# Patient Record
Sex: Female | Born: 1965
Health system: Southern US, Community
[De-identification: ages and names within clinical notes are randomized; demographics above are authoritative.]

## PROBLEM LIST (undated history)

## (undated) DIAGNOSIS — I10 Essential (primary) hypertension: Secondary | ICD-10-CM

## (undated) DIAGNOSIS — M1990 Unspecified osteoarthritis, unspecified site: Secondary | ICD-10-CM

## (undated) DIAGNOSIS — F419 Anxiety disorder, unspecified: Secondary | ICD-10-CM

## (undated) DIAGNOSIS — J449 Chronic obstructive pulmonary disease, unspecified: Secondary | ICD-10-CM

## (undated) DIAGNOSIS — C3490 Malignant neoplasm of unspecified part of unspecified bronchus or lung: Secondary | ICD-10-CM

## (undated) DIAGNOSIS — E039 Hypothyroidism, unspecified: Secondary | ICD-10-CM

## (undated) DIAGNOSIS — K219 Gastro-esophageal reflux disease without esophagitis: Secondary | ICD-10-CM

## (undated) DIAGNOSIS — I2510 Atherosclerotic heart disease of native coronary artery without angina pectoris: Secondary | ICD-10-CM

## (undated) HISTORY — DX: Malignant neoplasm of unspecified part of unspecified bronchus or lung: C34.90

## (undated) HISTORY — DX: Anxiety disorder, unspecified: F41.9

## (undated) HISTORY — DX: Unspecified osteoarthritis, unspecified site: M19.90

## (undated) HISTORY — PX: CHOLECYSTECTOMY: SHX55

## (undated) HISTORY — DX: Essential (primary) hypertension: I10

## (undated) HISTORY — PX: OOPHORECTOMY: SHX86

## (undated) HISTORY — DX: Gastro-esophageal reflux disease without esophagitis: K21.9

## (undated) HISTORY — PX: SALPINGOOPHORECTOMY: SHX82

## (undated) HISTORY — PX: APPENDECTOMY: SHX54

## (undated) HISTORY — DX: Chronic obstructive pulmonary disease, unspecified: J44.9

---
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
## 2004-10-11 HISTORY — PX: LOBECTOMY: SHX5089

## 2005-01-15 ENCOUNTER — Ambulatory Visit (HOSPITAL_COMMUNITY): Admission: RE | Admit: 2005-01-15 | Discharge: 2005-01-15 | Payer: Self-pay | Admitting: Internal Medicine

## 2005-01-22 ENCOUNTER — Ambulatory Visit (HOSPITAL_COMMUNITY): Admission: RE | Admit: 2005-01-22 | Discharge: 2005-01-22 | Payer: Self-pay | Admitting: Internal Medicine

## 2005-02-16 ENCOUNTER — Ambulatory Visit (HOSPITAL_COMMUNITY): Admission: RE | Admit: 2005-02-16 | Discharge: 2005-02-16 | Payer: Self-pay | Admitting: Pulmonary Disease

## 2005-03-23 ENCOUNTER — Inpatient Hospital Stay (HOSPITAL_COMMUNITY): Admission: RE | Admit: 2005-03-23 | Discharge: 2005-03-28 | Payer: Self-pay | Admitting: Thoracic Surgery

## 2005-03-23 ENCOUNTER — Encounter (INDEPENDENT_AMBULATORY_CARE_PROVIDER_SITE_OTHER): Payer: Self-pay | Admitting: *Deleted

## 2005-03-25 ENCOUNTER — Ambulatory Visit: Payer: Self-pay | Admitting: Internal Medicine

## 2005-03-29 ENCOUNTER — Ambulatory Visit: Payer: Self-pay | Admitting: Internal Medicine

## 2005-04-09 ENCOUNTER — Encounter: Admission: RE | Admit: 2005-04-09 | Discharge: 2005-04-09 | Payer: Self-pay | Admitting: Thoracic Surgery

## 2005-05-12 ENCOUNTER — Encounter: Admission: RE | Admit: 2005-05-12 | Discharge: 2005-05-12 | Payer: Self-pay | Admitting: Thoracic Surgery

## 2005-06-29 ENCOUNTER — Ambulatory Visit: Payer: Self-pay | Admitting: Internal Medicine

## 2005-07-13 ENCOUNTER — Encounter: Admission: RE | Admit: 2005-07-13 | Discharge: 2005-07-13 | Payer: Self-pay | Admitting: Thoracic Surgery

## 2005-07-16 ENCOUNTER — Encounter: Admission: RE | Admit: 2005-07-16 | Discharge: 2005-07-16 | Payer: Self-pay | Admitting: Oncology

## 2005-07-16 ENCOUNTER — Ambulatory Visit (HOSPITAL_COMMUNITY): Payer: Self-pay | Admitting: Oncology

## 2005-07-27 ENCOUNTER — Ambulatory Visit (HOSPITAL_COMMUNITY): Admission: RE | Admit: 2005-07-27 | Discharge: 2005-07-27 | Payer: Self-pay | Admitting: Family Medicine

## 2006-07-20 ENCOUNTER — Emergency Department (HOSPITAL_COMMUNITY): Admission: EM | Admit: 2006-07-20 | Discharge: 2006-07-20 | Payer: Self-pay | Admitting: Emergency Medicine

## 2006-10-07 ENCOUNTER — Ambulatory Visit (HOSPITAL_COMMUNITY): Admission: RE | Admit: 2006-10-07 | Discharge: 2006-10-07 | Payer: Self-pay | Admitting: Internal Medicine

## 2007-10-10 ENCOUNTER — Ambulatory Visit (HOSPITAL_COMMUNITY): Admission: RE | Admit: 2007-10-10 | Discharge: 2007-10-10 | Payer: Self-pay | Admitting: Internal Medicine

## 2008-05-11 HISTORY — PX: NM MYOCAR IMG MI: HXRAD627

## 2008-05-11 HISTORY — PX: TRANSTHORACIC ECHOCARDIOGRAM: SHX275

## 2008-09-10 ENCOUNTER — Inpatient Hospital Stay (HOSPITAL_COMMUNITY): Admission: EM | Admit: 2008-09-10 | Discharge: 2008-09-11 | Payer: Self-pay | Admitting: Emergency Medicine

## 2008-09-11 ENCOUNTER — Encounter (INDEPENDENT_AMBULATORY_CARE_PROVIDER_SITE_OTHER): Payer: Self-pay | Admitting: General Surgery

## 2010-10-31 ENCOUNTER — Encounter (HOSPITAL_COMMUNITY): Payer: Self-pay | Admitting: Oncology

## 2010-11-01 ENCOUNTER — Encounter: Payer: Self-pay | Admitting: Thoracic Surgery

## 2010-11-01 ENCOUNTER — Encounter: Payer: Self-pay | Admitting: Internal Medicine

## 2011-02-23 NOTE — H&P (Signed)
NAMEVICCI, Marissa [name]               ACCOUNT NO.:  [id number]   MEDICAL RECORD NO.:  [id number]          PATIENT TYPE:  EMS   LOCATION:  ED                            FACILITY:  [facility]   PHYSICIAN:  Dalia Heading, M.D.  DATE OF BIRTH:  05-25-1966   DATE OF ADMISSION:  09/10/2008  DATE OF DISCHARGE:  LH                              HISTORY & PHYSICAL   CHIEF COMPLAINT:  Cholecystitis, cholelithiasis.   HISTORY OF PRESENT ILLNESS:  The patient is a 45 year old white female  who presents with a 24-hour history of worsening right upper quadrant  abdominal pain, nausea, vomiting.  She did have a fatty meal yesterday  evening which caused her to have the biliary colic symptoms.  She  presented to the emergency room for further evaluation and treatment.  CT scan of the abdomen and pelvis revealed acute cholecystitis with  cholelithiasis.  The hepatobiliary tree was within normal limits.  She  does have a right adnexal cystic mass.  She has had pelvic surgery in  the past.  She denies any fever, chills, or jaundice.   PAST MEDICAL HISTORY:  1. Lung carcinoma.  2. Non-insulin-dependent diabetes mellitus.   PAST SURGICAL HISTORY:  1. Left lung lobectomy.  2. Appendectomy.  3. Hysterectomy.  4. Oophorectomy.   CURRENT MEDICATIONS:  Xanax, Tylox, metformin, Prilosec   ALLERGIES:  PENICILLIN.   REVIEW OF SYSTEMS:  The patient does smoke.  She denies any illicit drug  use or alcohol use.  She denies any recent chest pain, MI, CVA, or  bleeding disorders.   PHYSICAL EXAMINATION:  GENERAL:  The patient is an obese white female in  no acute distress.  She is afebrile.  VITAL SIGNS:  Stable.  HEENT:  Reveals no scleral icterus.  LUNGS:  Clear to auscultation with equal breath sounds bilaterally.  HEART:  Examination reveals regular rate and rhythm without history, S4,  or murmurs.  ABDOMEN:  Soft with tenderness down the right upper quadrant to  palpation.  No hepatosplenomegaly, masses,  hernias are identified.   LABORATORY DATA:  White blood cell count 10.7, hemoglobin 14.3,  hematocrit 42.2, platelet count 387.  Met-7 is remarkable for a glucose  of 329, BUN 4, creatinine 0.63.  SGOT and SGPT are within normal limits.  Total bilirubin is 0.3.  Urinalysis is negative.   IMPRESSION:  1. Cholecystitis, cholelithiasis.  2. History of lung cancer.  3. Hyperglycemia, uncontrolled diabetes mellitus.   PLAN:  The patient will be admitted to the hospital for control of her  sugars.  She will subsequently undergo laparoscopic cholecystectomy.  Risks and benefits of the procedure including bleeding, infection,  hepatobiliary injury, the possibility of an open procedure were fully  explained to the patient, gave informed consent.      Dalia Heading, M.D.  Electronically Signed     MAJ/MEDQ  D:  09/10/2008  T:  09/10/2008  Job:  [id number]   cc:   Madelin Rear. Sherwood Gambler, MD  Fax: [phone]

## 2011-02-23 NOTE — Op Note (Signed)
NAMEARLETT, GOOLD               ACCOUNT NO.:  [id number]   MEDICAL RECORD NO.:  [id number]          PATIENT TYPE:  INP   LOCATION:  A321                          FACILITY:  [facility]   PHYSICIAN:  Dalia Heading, M.D.  DATE OF BIRTH:  1965/12/27   DATE OF PROCEDURE:  DATE OF DISCHARGE:                               OPERATIVE REPORT   PREOPERATIVE DIAGNOSES:  Cholecystitis, cholelithiasis.   POSTOPERATIVE DIAGNOSES:  Cholecystitis, cholelithiasis.   PROCEDURE:  Laparoscopic cholecystectomy.   SURGEON:  Dalia Heading, MD   ANESTHESIA:  General endotracheal.   INDICATIONS:  The patient is a 45 year old white female who presented to  emergency room with right upper quadrant pain, nausea, vomiting.  CT  scan of the abdomen and pelvis was performed which revealed acute  cholecystitis with cholelithiasis.  The risks and benefits of the  procedure including bleeding, infection, hepatobiliary injury, and the  possibility of an open procedure were fully explained to the patient,  and gave informed consent.   PROCEDURE NOTE:  The patient was placed in the supine position.  After  induction of general endotracheal anesthesia, the abdomen was prepped  and draped using the usual sterile technique with Betadine.  Surgical  site confirmation was performed.   A supraumbilical incision was made down to the fascia.  A Veress needle  was introduced into the abdominal cavity and confirmation of placement  was done using the saline drop test.  The abdomen was then insufflated  to 16 mmHg pressure.  An 11-mm trocar was introduced into the abdominal  cavity under direct visualization without difficulty.  The patient was  placed in reversed Trendelenburg position.  An additional 11-mm trocar  was placed in the epigastric region, 5-mm trocar was placed in the right  upper quadrant right flank regions.  Liver was inspected and noted to be  within normal limits.  Gallbladder was retracted superiorly  and  laterally.  The dissection was begun around the infundibulum.  The  cystic duct was first identified.  Its juncture to the infundibulum  fully identified.  EndoClips were placed proximally and distally on the  cystic duct and the cystic duct was divided.  This likewise down the  cystic artery.  The gallbladder was then freed away from the gallbladder  fossa using Bovie electrocautery.  Gallbladder was delivered through the  epigastric trocar site using EndoCatch bag.  The gallbladder fossa was  inspected.  No abnormal bleeding or bile leakage was noted.  Surgicel  was placed in the gallbladder fossa.  All fluid and air were then  evacuated from the abdominal cavity prior to removal of the trocars.   All wounds were irrigated with normal saline.  All wounds were checked  with 0.5% Sensorcaine.  The supraumbilical fascia was reapproximated  using an 0 Vicryl interrupted suture.  All skin incisions were closed  using staples.  Betadine ointment and dry sterile dressings were  applied.   All tape and needle counts correct at the end of the procedure.  The  patient was extubated in the operating room and went back to recovery  room awake in stable condition.   COMPLICATIONS:  None.   SPECIMEN:  Gallbladder.   BLOOD LOSS:  Minimal.      Dalia Heading, M.D.  Electronically Signed     MAJ/MEDQ  D:  09/11/2008  T:  09/12/2008  Job:  [id number]   cc:   Madelin Rear. Sherwood Gambler, MD  Fax: [phone]
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
## 2011-02-26 NOTE — Op Note (Signed)
Marissa [name], Marissa [name]               ACCOUNT NO.:  [id number]   MEDICAL RECORD NO.:  [id number]          PATIENT TYPE:  INP   LOCATION:  2899                         FACILITY:  [facility]   PHYSICIAN:  Ines Bloomer, M.D. DATE OF BIRTH:  09/07/1966   DATE OF PROCEDURE:  03/23/2005  DATE OF DISCHARGE:                                 OPERATIVE REPORT   PREOPERATIVE DIAGNOSIS:  Left upper lobe mass.   POSTOPERATIVE DIAGNOSIS:  Left upper lobe mass with nonsmall cell  adenocarcinoma of the left upper lobe.   OPERATION PERFORMED:  1.  Left VATS.  2.  Left thoracotomy.  3.  Left upper lobectomy with node dissection.   SURGEON:  Dorita Sciara, M.D.   FIRST ASSISTANT:  Mr. Gershon Crane, P.A.C.   After percutaneous insertion of all monitor lines, the patient underwent  general anesthesia.  He was prepped and draped in the usual sterile manner.  The patient turned in the left lateral thoracotomy position.  A dual lumen  tube was inserted.  Two trocar sites were made in the anterior and posterior  axillary line at the 7th intercostal space.  Two trocars were inserted.  The  cancer was seen in the posterior segment in the left upper lobe.  A  posterolateral thoracotomy incision was made over the fifth intercostal  space, with latissimus being partially divided.  __________ anterior and two  Pas placed to the right angles.  The lesion was grasped and resected with  applications of the EZ 45 stapler and sent for frozen section which revealed  adenocarcinoma nonsmall cell lung cancer.  It was decided to do a left upper  lobectomy as there were several enlarged nodes around the bronchus.  The  dissection was started superiorly, dissecting out the superior pulmonary  vein.  It was stapled and divided with the EZ 45 stapler.  Then the apical  posterior branch was stapled and divided with the autosuture 30 white  reticular, the anterior branch was dissected out and stapled and divided  with the  autosuture stapler.  Several 10L nodes were dissected free.  Dissection was carried distally along the fissure.  The fissure was divided  with two applications of the EZ 45 stapler.  The lingular branches were  stapled and divided with the Ethicon vascular stapler.  Another 10L node was  dissected free, an 11L node was dissected out.  That exposed the bronchus,  which was stapled with a 10L 30 and divided distally and the left upper lobe  was removed.  CoSeal was applied to the staple line.  Two chest tubes were  brought out through separate stab wounds.  The inferior pole of the ligament  was taken down.  The On-Q was placed without difficulty in a substernal  position.  Chest was closed with four pericostals #1 Vicryl in the muscle  layer and 2-0 Vicryl in the subcutaneous tissue, Ethicon and skin clips.  The patient returned to the recovery room in stable condition.      DPB/MEDQ  D:  03/23/2005  T:  03/23/2005  Job:  [id number]

## 2011-02-26 NOTE — Discharge Summary (Signed)
Marissa [name], Marissa [name]               ACCOUNT NO.:  [id number]   MEDICAL RECORD NO.:  [id number]          PATIENT TYPE:  INP   LOCATION:  A321                          FACILITY:  [facility]   PHYSICIAN:  Dalia Heading, M.D.  DATE OF BIRTH:  06/29/1966   DATE OF ADMISSION:  09/10/2008  DATE OF DISCHARGE:  12/02/2009LH                               DISCHARGE SUMMARY   HOSPITAL COURSE:  Summary the patient is a 45 year old white female  present emergency room with worsening right upper quadrant abdominal  pain.  She was found on CT scan of the abdomen and pelvis to have acute  cholecystitis with cholelithiasis.  Surgery was consulted from the  emergency room.  The patient was admitted to hospital for further  evaluation and treatment.  She subsequently underwent laparoscopic  cholecystectomy on September 11, 2008.  She tolerated procedure well.  Postoperative course was unremarkable.  Diet was advanced out  difficulty.  The patient was discharged home on 09/2008 in good  improving condition.   DISCHARGE INSTRUCTIONS:  The patient is to follow up Dr. Franky Macho on  September 19, 2008.   DISCHARGE MEDICATIONS:  1. Prilosec 20 mg p.o. daily.  2. Xanax 1 mg p.o. q.6 h p.r.n.  3. Vesicare 5 mg p.o. daily.  4. Albuterol inhaler p.r.n.  5. Percocet 1-2 tablets p.o. q.4 h p.r.n. pain.   PRINCIPAL DIAGNOSES:  1. Cholecystitis, cholelithiasis.  2. Non-insulin dependent diabetes mellitus.  3. History of lung carcinoma.   PRINCIPAL PROCEDURE:  Laparoscopic cholecystectomy on September 11, 2008.      Dalia Heading, M.D.  Electronically Signed     MAJ/MEDQ  D:  09/20/2008  T:  09/20/2008  Job:  [id number]   cc:   Madelin Rear. Sherwood Gambler, MD  Fax: [phone]
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
## 2011-07-13 ENCOUNTER — Other Ambulatory Visit (HOSPITAL_COMMUNITY): Payer: Self-pay | Admitting: Internal Medicine

## 2011-07-13 DIAGNOSIS — G8929 Other chronic pain: Secondary | ICD-10-CM

## 2011-07-15 ENCOUNTER — Ambulatory Visit (HOSPITAL_COMMUNITY)
Admission: RE | Admit: 2011-07-15 | Discharge: 2011-07-15 | Disposition: A | Discharge status: Home / Self Care | Payer: Medicare Other | Source: Ambulatory Visit | Attending: Internal Medicine | Admitting: Internal Medicine

## 2011-07-15 DIAGNOSIS — Z85118 Personal history of other malignant neoplasm of bronchus and lung: Secondary | ICD-10-CM | POA: Insufficient documentation

## 2011-07-15 DIAGNOSIS — G8929 Other chronic pain: Secondary | ICD-10-CM

## 2011-07-15 DIAGNOSIS — M549 Dorsalgia, unspecified: Secondary | ICD-10-CM | POA: Insufficient documentation

## 2011-07-15 DIAGNOSIS — R079 Chest pain, unspecified: Secondary | ICD-10-CM | POA: Insufficient documentation

## 2011-07-15 DIAGNOSIS — R918 Other nonspecific abnormal finding of lung field: Secondary | ICD-10-CM | POA: Insufficient documentation

## 2011-07-16 LAB — GLUCOSE, CAPILLARY
Glucose-Capillary: 161 mg/dL — ABNORMAL HIGH (ref 70–99)
Glucose-Capillary: 177 mg/dL — ABNORMAL HIGH (ref 70–99)
Glucose-Capillary: 235 mg/dL — ABNORMAL HIGH (ref 70–99)
Glucose-Capillary: 241 mg/dL — ABNORMAL HIGH (ref 70–99)
Glucose-Capillary: 278 mg/dL — ABNORMAL HIGH (ref 70–99)

## 2011-07-16 LAB — COMPREHENSIVE METABOLIC PANEL
ALT: 20 U/L (ref 0–35)
Albumin: 3.1 g/dL — ABNORMAL LOW (ref 3.5–5.2)
Alkaline Phosphatase: 116 U/L (ref 39–117)
Chloride: 105 mEq/L (ref 96–112)
Potassium: 3.8 mEq/L (ref 3.5–5.1)
Sodium: 136 mEq/L (ref 135–145)
Total Bilirubin: 0.3 mg/dL (ref 0.3–1.2)
Total Protein: 6.2 g/dL (ref 6.0–8.3)

## 2011-07-16 LAB — DIFFERENTIAL
Basophils Relative: 0 % (ref 0–1)
Eosinophils Absolute: 0.2 10*3/uL (ref 0.0–0.7)
Eosinophils Relative: 2 % (ref 0–5)
Monocytes Absolute: 0.8 10*3/uL (ref 0.1–1.0)
Monocytes Relative: 8 % (ref 3–12)

## 2011-07-16 LAB — CBC
Hemoglobin: 14.3 g/dL (ref 12.0–15.0)
Platelets: 387 10*3/uL (ref 150–400)
RDW: 12.7 % (ref 11.5–15.5)
WBC: 10.7 10*3/uL — ABNORMAL HIGH (ref 4.0–10.5)

## 2011-07-16 LAB — URINALYSIS, ROUTINE W REFLEX MICROSCOPIC
Bilirubin Urine: NEGATIVE
Glucose, UA: >1000 mg/dL — AB
Hgb urine dipstick: NEGATIVE
Ketones, ur: NEGATIVE mg/dL
Leukocytes, UA: NEGATIVE
pH: 5 (ref 5.0–8.0)

## 2011-07-16 LAB — HEMOGLOBIN A1C
Hgb A1c MFr Bld: 11 % — ABNORMAL HIGH (ref 4.6–6.1)
Mean Plasma Glucose: 269 mg/dL

## 2011-07-20 ENCOUNTER — Encounter (HOSPITAL_COMMUNITY): Payer: Medicare Other | Attending: Oncology | Admitting: Oncology

## 2011-07-20 ENCOUNTER — Encounter (HOSPITAL_COMMUNITY): Payer: Self-pay | Admitting: Oncology

## 2011-07-20 VITALS — BP 120/83 | HR 98 | Temp 97.9°F | Ht 64.75 in | Wt 170.0 lb

## 2011-07-20 DIAGNOSIS — C3410 Malignant neoplasm of upper lobe, unspecified bronchus or lung: Secondary | ICD-10-CM

## 2011-07-20 DIAGNOSIS — C3490 Malignant neoplasm of unspecified part of unspecified bronchus or lung: Secondary | ICD-10-CM

## 2011-07-20 NOTE — Progress Notes (Signed)
This office note has been dictated.

## 2011-07-20 NOTE — Patient Instructions (Signed)
[facility]  Discharge Instructions  RECOMMENDATIONS MADE BY THE CONSULTANT AND ANY TEST RESULTS WILL BE SENT TO YOUR REFERRING DOCTOR.   EXAM FINDINGS BY MD TODAY AND SIGNS AND SYMPTOMS TO REPORT TO CLINIC OR PRIMARY MD: need to try very hard to stop smoking.  MEDICATIONS PRESCRIBED: none   INSTRUCTIONS GIVEN AND DISCUSSED: Other :  Report increased shortness of breath, blood in sputum, frequent upper respiratory problems, etc.  SPECIAL INSTRUCTIONS/FOLLOW-UP: Xray Studies Needed CT of chest in April and Return to see Dr. Mariel Sleet after CT scan results are available.   I acknowledge that I have been informed and understand all the instructions given to me and received a copy. I do not have any more questions at this time, but understand that I may call the Specialty Clinic at [facility] at [phone] during business hours should I have any further questions or need assistance in obtaining follow-up care.    __________________________________________  _____________  __________ Signature of Patient or Authorized Representative            Date                   Time    __________________________________________ Nurse's Signature
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
## 2011-10-22 ENCOUNTER — Other Ambulatory Visit (HOSPITAL_COMMUNITY): Payer: Self-pay | Admitting: Internal Medicine

## 2011-10-22 DIAGNOSIS — Z139 Encounter for screening, unspecified: Secondary | ICD-10-CM

## 2011-10-25 ENCOUNTER — Ambulatory Visit (HOSPITAL_COMMUNITY)
Admission: RE | Admit: 2011-10-25 | Discharge: 2011-10-25 | Disposition: A | Discharge status: Home / Self Care | Payer: Medicare Other | Source: Ambulatory Visit | Attending: Internal Medicine | Admitting: Internal Medicine

## 2011-10-25 ENCOUNTER — Ambulatory Visit (HOSPITAL_COMMUNITY): Payer: Medicare Other

## 2011-10-25 DIAGNOSIS — Z1231 Encounter for screening mammogram for malignant neoplasm of breast: Secondary | ICD-10-CM | POA: Insufficient documentation

## 2011-10-25 DIAGNOSIS — Z139 Encounter for screening, unspecified: Secondary | ICD-10-CM

## 2011-11-01 DIAGNOSIS — R945 Abnormal results of liver function studies: Secondary | ICD-10-CM | POA: Diagnosis not present

## 2011-11-01 DIAGNOSIS — T3190 Burns involving 90% or more of body surface with 0% to 9% third degree burns: Secondary | ICD-10-CM | POA: Diagnosis not present

## 2011-12-06 DIAGNOSIS — E669 Obesity, unspecified: Secondary | ICD-10-CM | POA: Diagnosis not present

## 2011-12-06 DIAGNOSIS — E119 Type 2 diabetes mellitus without complications: Secondary | ICD-10-CM | POA: Diagnosis not present

## 2011-12-06 DIAGNOSIS — Z6828 Body mass index (BMI) 28.0-28.9, adult: Secondary | ICD-10-CM | POA: Diagnosis not present

## 2011-12-06 DIAGNOSIS — J45909 Unspecified asthma, uncomplicated: Secondary | ICD-10-CM | POA: Diagnosis not present

## 2011-12-06 DIAGNOSIS — G8929 Other chronic pain: Secondary | ICD-10-CM | POA: Diagnosis not present

## 2012-01-18 ENCOUNTER — Ambulatory Visit (HOSPITAL_COMMUNITY)
Admission: RE | Admit: 2012-01-18 | Discharge: 2012-01-18 | Disposition: A | Discharge status: Home / Self Care | Payer: Medicare Other | Source: Ambulatory Visit | Attending: Oncology | Admitting: Oncology

## 2012-01-18 DIAGNOSIS — R911 Solitary pulmonary nodule: Secondary | ICD-10-CM | POA: Diagnosis not present

## 2012-01-18 DIAGNOSIS — C3490 Malignant neoplasm of unspecified part of unspecified bronchus or lung: Secondary | ICD-10-CM | POA: Insufficient documentation

## 2012-01-18 DIAGNOSIS — J984 Other disorders of lung: Secondary | ICD-10-CM | POA: Diagnosis not present

## 2012-01-19 ENCOUNTER — Encounter (HOSPITAL_COMMUNITY): Payer: Self-pay | Admitting: Oncology

## 2012-01-19 ENCOUNTER — Encounter (HOSPITAL_COMMUNITY): Payer: Medicare Other | Attending: Oncology | Admitting: Oncology

## 2012-01-19 VITALS — BP 97/67 | HR 98 | Temp 98.0°F | Wt 169.7 lb

## 2012-01-19 DIAGNOSIS — C3490 Malignant neoplasm of unspecified part of unspecified bronchus or lung: Secondary | ICD-10-CM

## 2012-01-19 DIAGNOSIS — C3410 Malignant neoplasm of upper lobe, unspecified bronchus or lung: Secondary | ICD-10-CM | POA: Diagnosis not present

## 2012-01-19 NOTE — Progress Notes (Signed)
This office note has been dictated.

## 2012-01-19 NOTE — Progress Notes (Signed)
CC:   Marissa [name]. Sherwood Gambler, MD  DIAGNOSES: 1. History of stage IA bronchoalveolar carcinoma of the lung, status     post left upper lobectomy on 03/23/2005 by Dr. Edwyna Shell. 2. Small, nonspecific 2-3 mm nodules of the lung, stable on her CT     scan done yesterday. 3. Longstanding smoking history since age 46, still smoking at least a     pack of cigarettes a day. [name] is here with her sister, and her CT scan from yesterday really is very stable.  She is still smoking, unfortunately, so I have given her information on the smoking cessation class that [facility] offers and she will consider going, she states.  She has tried Chantix, which gave her a change in personality.  She tried the patches at of NicoDerm, which gave her glue intolerance, and she cannot use the gum, she states, too well because of her teeth.  She has tried Wellbutrin, but that also made her feel very uncomfortable, so I do not know if she is going to be able to do anything other then stop on her own with the help of the smoking cessation classes, but she clearly needs to and she promises to try. Otherwise, will see her back with a CT scan in a year.    ______________________________ [name]. Mariel Sleet, MD ESN/MEDQ  D:  01/19/2012  T:  01/19/2012  Job:  [id number]

## 2012-01-19 NOTE — Patient Instructions (Signed)
VAIDEHI BRADDY  [id number] 1965-11-15   [facility]  Discharge Instructions  RECOMMENDATIONS MADE BY THE CONSULTANT AND ANY TEST RESULTS WILL BE SENT TO YOUR REFERRING DOCTOR.   EXAM FINDINGS BY MD TODAY AND SIGNS AND SYMPTOMS TO REPORT TO CLINIC OR PRIMARY MD: Exam findings as discussed by Dr. Mariel Sleet.  INSTRUCTIONS GIVEN AND DISCUSSED: 1.  We will schedule you to follow-up in 1 year with Dr. Mariel Sleet and to have your CT repeated 2.  We will also be contacting you with information for the smoking cessation classes.  I acknowledge that I have been informed and understand all the instructions given to me and received a copy. I do not have any more questions at this time, but understand that I may call the Specialty Clinic at [facility] at [phone] during business hours should I have any further questions or need assistance in obtaining follow-up care.    __________________________________________  _____________  __________ Signature of Patient or Authorized Representative            Date                   Time    __________________________________________ Nurse's Signature

## 2012-03-13 DIAGNOSIS — K219 Gastro-esophageal reflux disease without esophagitis: Secondary | ICD-10-CM | POA: Diagnosis not present

## 2012-03-13 DIAGNOSIS — Z6827 Body mass index (BMI) 27.0-27.9, adult: Secondary | ICD-10-CM | POA: Diagnosis not present

## 2012-03-13 DIAGNOSIS — G8929 Other chronic pain: Secondary | ICD-10-CM | POA: Diagnosis not present

## 2012-03-13 DIAGNOSIS — E119 Type 2 diabetes mellitus without complications: Secondary | ICD-10-CM | POA: Diagnosis not present

## 2012-05-22 ENCOUNTER — Other Ambulatory Visit (HOSPITAL_COMMUNITY): Payer: Self-pay | Admitting: Internal Medicine

## 2012-05-22 ENCOUNTER — Ambulatory Visit (HOSPITAL_COMMUNITY)
Admission: RE | Admit: 2012-05-22 | Discharge: 2012-05-22 | Disposition: A | Discharge status: Home / Self Care | Payer: Medicare Other | Source: Ambulatory Visit | Attending: Internal Medicine | Admitting: Internal Medicine

## 2012-05-22 DIAGNOSIS — M25579 Pain in unspecified ankle and joints of unspecified foot: Secondary | ICD-10-CM | POA: Insufficient documentation

## 2012-05-22 DIAGNOSIS — S93699A Other sprain of unspecified foot, initial encounter: Secondary | ICD-10-CM

## 2012-05-22 DIAGNOSIS — S92309A Fracture of unspecified metatarsal bone(s), unspecified foot, initial encounter for closed fracture: Secondary | ICD-10-CM | POA: Insufficient documentation

## 2012-05-22 DIAGNOSIS — J45909 Unspecified asthma, uncomplicated: Secondary | ICD-10-CM | POA: Diagnosis not present

## 2012-05-22 DIAGNOSIS — Z043 Encounter for examination and observation following other accident: Secondary | ICD-10-CM | POA: Diagnosis not present

## 2012-05-22 DIAGNOSIS — X58XXXA Exposure to other specified factors, initial encounter: Secondary | ICD-10-CM | POA: Insufficient documentation

## 2012-05-22 DIAGNOSIS — Z6828 Body mass index (BMI) 28.0-28.9, adult: Secondary | ICD-10-CM | POA: Diagnosis not present

## 2012-05-22 DIAGNOSIS — K219 Gastro-esophageal reflux disease without esophagitis: Secondary | ICD-10-CM | POA: Diagnosis not present

## 2012-05-26 DIAGNOSIS — M25579 Pain in unspecified ankle and joints of unspecified foot: Secondary | ICD-10-CM | POA: Diagnosis not present

## 2012-05-26 DIAGNOSIS — S92309A Fracture of unspecified metatarsal bone(s), unspecified foot, initial encounter for closed fracture: Secondary | ICD-10-CM | POA: Diagnosis not present

## 2012-06-26 DIAGNOSIS — R7309 Other abnormal glucose: Secondary | ICD-10-CM | POA: Diagnosis not present

## 2012-06-26 DIAGNOSIS — G8929 Other chronic pain: Secondary | ICD-10-CM | POA: Diagnosis not present

## 2012-06-26 DIAGNOSIS — E1142 Type 2 diabetes mellitus with diabetic polyneuropathy: Secondary | ICD-10-CM | POA: Diagnosis not present

## 2012-06-26 DIAGNOSIS — Z79899 Other long term (current) drug therapy: Secondary | ICD-10-CM | POA: Diagnosis not present

## 2012-06-26 DIAGNOSIS — Z6827 Body mass index (BMI) 27.0-27.9, adult: Secondary | ICD-10-CM | POA: Diagnosis not present

## 2012-06-26 DIAGNOSIS — E1149 Type 2 diabetes mellitus with other diabetic neurological complication: Secondary | ICD-10-CM | POA: Diagnosis not present

## 2012-06-27 DIAGNOSIS — E119 Type 2 diabetes mellitus without complications: Secondary | ICD-10-CM | POA: Diagnosis not present

## 2012-08-29 DIAGNOSIS — Z719 Counseling, unspecified: Secondary | ICD-10-CM | POA: Diagnosis not present

## 2012-08-29 DIAGNOSIS — E1142 Type 2 diabetes mellitus with diabetic polyneuropathy: Secondary | ICD-10-CM | POA: Diagnosis not present

## 2012-08-29 DIAGNOSIS — G8929 Other chronic pain: Secondary | ICD-10-CM | POA: Diagnosis not present

## 2012-08-29 DIAGNOSIS — F411 Generalized anxiety disorder: Secondary | ICD-10-CM | POA: Diagnosis not present

## 2012-09-28 DIAGNOSIS — E1142 Type 2 diabetes mellitus with diabetic polyneuropathy: Secondary | ICD-10-CM | POA: Diagnosis not present

## 2012-09-28 DIAGNOSIS — J019 Acute sinusitis, unspecified: Secondary | ICD-10-CM | POA: Diagnosis not present

## 2012-09-28 DIAGNOSIS — Z6826 Body mass index (BMI) 26.0-26.9, adult: Secondary | ICD-10-CM | POA: Diagnosis not present

## 2012-09-28 DIAGNOSIS — G5770 Causalgia of unspecified lower limb: Secondary | ICD-10-CM | POA: Diagnosis not present

## 2012-09-28 DIAGNOSIS — Z23 Encounter for immunization: Secondary | ICD-10-CM | POA: Diagnosis not present

## 2012-09-28 DIAGNOSIS — E1049 Type 1 diabetes mellitus with other diabetic neurological complication: Secondary | ICD-10-CM | POA: Diagnosis not present

## 2012-10-17 ENCOUNTER — Encounter (INDEPENDENT_AMBULATORY_CARE_PROVIDER_SITE_OTHER): Payer: Self-pay | Admitting: *Deleted

## 2012-10-18 ENCOUNTER — Encounter (INDEPENDENT_AMBULATORY_CARE_PROVIDER_SITE_OTHER): Payer: Self-pay

## 2012-11-24 ENCOUNTER — Other Ambulatory Visit (HOSPITAL_COMMUNITY): Payer: Self-pay | Admitting: Internal Medicine

## 2012-11-24 DIAGNOSIS — K219 Gastro-esophageal reflux disease without esophagitis: Secondary | ICD-10-CM | POA: Diagnosis not present

## 2012-11-24 DIAGNOSIS — E1149 Type 2 diabetes mellitus with other diabetic neurological complication: Secondary | ICD-10-CM | POA: Diagnosis not present

## 2012-11-24 DIAGNOSIS — Z139 Encounter for screening, unspecified: Secondary | ICD-10-CM

## 2012-11-24 DIAGNOSIS — Z6828 Body mass index (BMI) 28.0-28.9, adult: Secondary | ICD-10-CM | POA: Diagnosis not present

## 2012-11-24 DIAGNOSIS — G8929 Other chronic pain: Secondary | ICD-10-CM | POA: Diagnosis not present

## 2012-11-28 ENCOUNTER — Ambulatory Visit (HOSPITAL_COMMUNITY): Payer: Medicare Other

## 2012-12-22 DIAGNOSIS — G8929 Other chronic pain: Secondary | ICD-10-CM | POA: Diagnosis not present

## 2012-12-22 DIAGNOSIS — Z6826 Body mass index (BMI) 26.0-26.9, adult: Secondary | ICD-10-CM | POA: Diagnosis not present

## 2012-12-22 DIAGNOSIS — Z719 Counseling, unspecified: Secondary | ICD-10-CM | POA: Diagnosis not present

## 2012-12-22 DIAGNOSIS — G5770 Causalgia of unspecified lower limb: Secondary | ICD-10-CM | POA: Diagnosis not present

## 2012-12-22 DIAGNOSIS — H66009 Acute suppurative otitis media without spontaneous rupture of ear drum, unspecified ear: Secondary | ICD-10-CM | POA: Diagnosis not present

## 2012-12-22 DIAGNOSIS — E1149 Type 2 diabetes mellitus with other diabetic neurological complication: Secondary | ICD-10-CM | POA: Diagnosis not present

## 2013-01-17 ENCOUNTER — Other Ambulatory Visit (HOSPITAL_COMMUNITY): Payer: Self-pay | Admitting: Oncology

## 2013-01-17 ENCOUNTER — Telehealth (HOSPITAL_COMMUNITY): Payer: Self-pay | Admitting: Oncology

## 2013-01-18 ENCOUNTER — Ambulatory Visit (HOSPITAL_COMMUNITY): Payer: Medicare Other

## 2013-01-19 ENCOUNTER — Ambulatory Visit (HOSPITAL_COMMUNITY): Payer: Medicare Other | Admitting: Oncology

## 2013-01-22 ENCOUNTER — Other Ambulatory Visit (HOSPITAL_COMMUNITY): Payer: Self-pay | Admitting: Oncology

## 2013-01-22 ENCOUNTER — Telehealth (HOSPITAL_COMMUNITY): Payer: Self-pay | Admitting: Oncology

## 2013-01-23 ENCOUNTER — Ambulatory Visit (HOSPITAL_COMMUNITY)
Admission: RE | Admit: 2013-01-23 | Discharge: 2013-01-23 | Disposition: A | Discharge status: Home / Self Care | Payer: Medicare Other | Source: Ambulatory Visit | Attending: Internal Medicine | Admitting: Internal Medicine

## 2013-01-23 ENCOUNTER — Ambulatory Visit (HOSPITAL_COMMUNITY)
Admission: RE | Admit: 2013-01-23 | Discharge: 2013-01-23 | Disposition: A | Discharge status: Home / Self Care | Payer: Medicare Other | Source: Ambulatory Visit | Attending: Oncology | Admitting: Oncology

## 2013-01-23 DIAGNOSIS — C3490 Malignant neoplasm of unspecified part of unspecified bronchus or lung: Secondary | ICD-10-CM | POA: Diagnosis not present

## 2013-01-23 DIAGNOSIS — Z139 Encounter for screening, unspecified: Secondary | ICD-10-CM

## 2013-01-23 DIAGNOSIS — R911 Solitary pulmonary nodule: Secondary | ICD-10-CM | POA: Insufficient documentation

## 2013-01-23 DIAGNOSIS — Z1231 Encounter for screening mammogram for malignant neoplasm of breast: Secondary | ICD-10-CM | POA: Diagnosis not present

## 2013-01-23 DIAGNOSIS — F17200 Nicotine dependence, unspecified, uncomplicated: Secondary | ICD-10-CM

## 2013-02-06 ENCOUNTER — Encounter (HOSPITAL_COMMUNITY): Payer: Medicare Other | Attending: Oncology | Admitting: Oncology

## 2013-02-06 VITALS — BP 101/79 | HR 89 | Temp 97.1°F | Resp 18 | Wt 158.2 lb

## 2013-02-06 DIAGNOSIS — E119 Type 2 diabetes mellitus without complications: Secondary | ICD-10-CM | POA: Diagnosis not present

## 2013-02-06 DIAGNOSIS — C3410 Malignant neoplasm of upper lobe, unspecified bronchus or lung: Secondary | ICD-10-CM

## 2013-02-06 DIAGNOSIS — C3492 Malignant neoplasm of unspecified part of left bronchus or lung: Secondary | ICD-10-CM

## 2013-02-06 DIAGNOSIS — F17200 Nicotine dependence, unspecified, uncomplicated: Secondary | ICD-10-CM | POA: Diagnosis not present

## 2013-02-06 NOTE — Patient Instructions (Signed)
[facility] Discharge Instructions  RECOMMENDATIONS MADE BY THE CONSULTANT AND ANY TEST RESULTS WILL BE SENT TO YOUR REFERRING PHYSICIAN.  EXAM FINDINGS BY THE PHYSICIAN TODAY AND SIGNS OR SYMPTOMS TO REPORT TO CLINIC OR PRIMARY PHYSICIAN:    SPECIAL INSTRUCTIONS/FOLLOW-UP: CT scan in one year and then to see Maisie Fus, [place] after for follow up and results.  Please see the front desk as you leave for appointments.    Thank you for choosing [facility] to provide your oncology and hematology care.  To afford each patient quality time with our providers, please arrive at least 15 minutes before your scheduled appointment time.  With your help, our goal is to use those 15 minutes to complete the necessary work-up to ensure our physicians have the information they need to help with your evaluation and healthcare recommendations.    Effective January 1st, 2014, we ask that you re-schedule your appointment with our physicians should you arrive 10 or more minutes late for your appointment.  We strive to give you quality time with our providers, and arriving late affects you and other patients whose appointments are after yours.    Again, thank you for choosing [facility].  Our hope is that these requests will decrease the amount of time that you wait before being seen by our physicians.       _____________________________________________________________  Should you have questions after your visit to [facility], please contact our office at [phone] between the hours of 8:30 a.m. and 5:00 p.m.  Voicemails left after 4:30 p.m. will not be returned until the following business day.  For prescription refill requests, have your pharmacy contact our office with your prescription refill request.

## 2013-02-06 NOTE — Progress Notes (Signed)
#  1 stage IA bronchoalveolar carcinoma the left lung status post left upper lobectomy 03/23/2005 by Dr. Dewayne Shorter without recurrence thus far  #2 long-standing smoking history still smoking 5 cigarettes a day though she is still trying to quit she states presently utilizing the electronic cigarette as well  #3 diabetes mellitus in need of much better control.  Her CAT scan today shows no new lesions. She says lung disease from her smoking and we went over what she is doing that regard. She also needs to work her diabetes which is out of control.  She is without pulmonary symptoms presently.  Her vital signs show her weight is down approximately 11 pounds which is definitely an improvement. She has no lymphadenopathy in cervical, supraclavicular, infraclavicular, axillary areas. Her left chest wall incision is well-healed. She states occasionally cramps around this area once a great while. Her lungs though show diminished breath sounds but otherwise they are clear. Her heart shows a regular rhythm and rate without obvious murmur rub or gallop. She has no leg edema.  We will see her back in a year with another CT scan. Dr. Sherwood Gambler is doing her blood work.

## 2013-02-23 DIAGNOSIS — Z23 Encounter for immunization: Secondary | ICD-10-CM | POA: Diagnosis not present

## 2013-02-23 DIAGNOSIS — Z0000 Encounter for general adult medical examination without abnormal findings: Secondary | ICD-10-CM | POA: Diagnosis not present

## 2013-02-23 DIAGNOSIS — E785 Hyperlipidemia, unspecified: Secondary | ICD-10-CM | POA: Diagnosis not present

## 2013-02-23 DIAGNOSIS — E1142 Type 2 diabetes mellitus with diabetic polyneuropathy: Secondary | ICD-10-CM | POA: Diagnosis not present

## 2013-02-23 DIAGNOSIS — E1149 Type 2 diabetes mellitus with other diabetic neurological complication: Secondary | ICD-10-CM | POA: Diagnosis not present

## 2013-02-23 DIAGNOSIS — Z79899 Other long term (current) drug therapy: Secondary | ICD-10-CM | POA: Diagnosis not present

## 2013-02-23 DIAGNOSIS — Z719 Counseling, unspecified: Secondary | ICD-10-CM | POA: Diagnosis not present

## 2013-02-23 DIAGNOSIS — Z6825 Body mass index (BMI) 25.0-25.9, adult: Secondary | ICD-10-CM | POA: Diagnosis not present

## 2013-03-16 ENCOUNTER — Ambulatory Visit: Payer: Medicare Other | Admitting: Cardiology

## 2013-03-27 ENCOUNTER — Ambulatory Visit: Payer: Medicare Other | Admitting: Cardiology

## 2013-04-04 ENCOUNTER — Ambulatory Visit: Payer: Medicare Other | Admitting: Cardiology

## 2013-05-28 DIAGNOSIS — G8929 Other chronic pain: Secondary | ICD-10-CM | POA: Diagnosis not present

## 2013-05-28 DIAGNOSIS — G43909 Migraine, unspecified, not intractable, without status migrainosus: Secondary | ICD-10-CM | POA: Diagnosis not present

## 2013-05-28 DIAGNOSIS — Z23 Encounter for immunization: Secondary | ICD-10-CM | POA: Diagnosis not present

## 2013-05-28 DIAGNOSIS — Z681 Body mass index (BMI) 19 or less, adult: Secondary | ICD-10-CM | POA: Diagnosis not present

## 2013-05-28 DIAGNOSIS — E119 Type 2 diabetes mellitus without complications: Secondary | ICD-10-CM | POA: Diagnosis not present

## 2013-06-29 DIAGNOSIS — Z6825 Body mass index (BMI) 25.0-25.9, adult: Secondary | ICD-10-CM | POA: Diagnosis not present

## 2013-06-29 DIAGNOSIS — Z23 Encounter for immunization: Secondary | ICD-10-CM | POA: Diagnosis not present

## 2013-06-29 DIAGNOSIS — E119 Type 2 diabetes mellitus without complications: Secondary | ICD-10-CM | POA: Diagnosis not present

## 2013-06-29 DIAGNOSIS — J209 Acute bronchitis, unspecified: Secondary | ICD-10-CM | POA: Diagnosis not present

## 2013-06-29 DIAGNOSIS — K219 Gastro-esophageal reflux disease without esophagitis: Secondary | ICD-10-CM | POA: Diagnosis not present

## 2013-08-20 DIAGNOSIS — E1149 Type 2 diabetes mellitus with other diabetic neurological complication: Secondary | ICD-10-CM | POA: Diagnosis not present

## 2013-08-20 DIAGNOSIS — Z6825 Body mass index (BMI) 25.0-25.9, adult: Secondary | ICD-10-CM | POA: Diagnosis not present

## 2013-08-20 DIAGNOSIS — G8929 Other chronic pain: Secondary | ICD-10-CM | POA: Diagnosis not present

## 2013-08-20 DIAGNOSIS — Z0000 Encounter for general adult medical examination without abnormal findings: Secondary | ICD-10-CM | POA: Diagnosis not present

## 2013-08-20 DIAGNOSIS — K219 Gastro-esophageal reflux disease without esophagitis: Secondary | ICD-10-CM | POA: Diagnosis not present

## 2013-08-21 DIAGNOSIS — Z79899 Other long term (current) drug therapy: Secondary | ICD-10-CM | POA: Diagnosis not present

## 2013-08-21 DIAGNOSIS — Z01419 Encounter for gynecological examination (general) (routine) without abnormal findings: Secondary | ICD-10-CM | POA: Diagnosis not present

## 2013-08-21 DIAGNOSIS — Z124 Encounter for screening for malignant neoplasm of cervix: Secondary | ICD-10-CM | POA: Diagnosis not present

## 2013-08-21 DIAGNOSIS — Z6825 Body mass index (BMI) 25.0-25.9, adult: Secondary | ICD-10-CM | POA: Diagnosis not present

## 2013-09-11 ENCOUNTER — Ambulatory Visit: Payer: Medicare Other | Admitting: Cardiovascular Disease

## 2013-09-24 ENCOUNTER — Ambulatory Visit: Payer: Medicare Other | Admitting: Cardiovascular Disease

## 2013-10-22 DIAGNOSIS — Z6825 Body mass index (BMI) 25.0-25.9, adult: Secondary | ICD-10-CM | POA: Diagnosis not present

## 2013-10-22 DIAGNOSIS — G8929 Other chronic pain: Secondary | ICD-10-CM | POA: Diagnosis not present

## 2013-11-12 ENCOUNTER — Ambulatory Visit: Payer: Medicare Other | Admitting: Cardiovascular Disease

## 2013-12-31 DIAGNOSIS — Z6826 Body mass index (BMI) 26.0-26.9, adult: Secondary | ICD-10-CM | POA: Diagnosis not present

## 2013-12-31 DIAGNOSIS — G8929 Other chronic pain: Secondary | ICD-10-CM | POA: Diagnosis not present

## 2013-12-31 DIAGNOSIS — F411 Generalized anxiety disorder: Secondary | ICD-10-CM | POA: Diagnosis not present

## 2014-02-04 ENCOUNTER — Other Ambulatory Visit (HOSPITAL_COMMUNITY): Payer: Self-pay | Admitting: Internal Medicine

## 2014-02-04 DIAGNOSIS — Z1231 Encounter for screening mammogram for malignant neoplasm of breast: Secondary | ICD-10-CM

## 2014-02-06 ENCOUNTER — Ambulatory Visit (HOSPITAL_COMMUNITY)
Admission: RE | Admit: 2014-02-06 | Discharge: 2014-02-06 | Disposition: A | Discharge status: Home / Self Care | Payer: Medicare Other | Source: Ambulatory Visit | Attending: Oncology | Admitting: Oncology

## 2014-02-06 ENCOUNTER — Encounter (HOSPITAL_COMMUNITY): Payer: Self-pay | Admitting: Oncology

## 2014-02-06 DIAGNOSIS — C3490 Malignant neoplasm of unspecified part of unspecified bronchus or lung: Secondary | ICD-10-CM | POA: Diagnosis not present

## 2014-02-06 DIAGNOSIS — C3492 Malignant neoplasm of unspecified part of left bronchus or lung: Secondary | ICD-10-CM

## 2014-02-06 DIAGNOSIS — Z09 Encounter for follow-up examination after completed treatment for conditions other than malignant neoplasm: Secondary | ICD-10-CM | POA: Insufficient documentation

## 2014-02-06 HISTORY — DX: Malignant neoplasm of unspecified part of unspecified bronchus or lung: C34.90

## 2014-02-06 NOTE — Progress Notes (Signed)
Glo Herring., MD [address] [place] [address]  Adenocarcinoma of lung  CURRENT THERAPY: Surveillance per NCCN guidelines  INTERVAL HISTORY: Marissa [name] 48 y.o. female returns for  regular  visit for followup of Stage IA adenocarcinoma, bronchoaveolar type, of left upper lung.  S/P left upper lobectomy on 03/23/2005 by Dr. Arlyce Dice.     Adenocarcinoma of lung   03/23/2005 Initial Diagnosis Adenocarcinoma of lung with left upper lobe wedge resection by Dr. Arlyce Dice measuring 1.4 cm.  0/4 lymph nodes.   I personally reviewed and went over radiographic studies with the patient.  The results are noted within this dictation.  CT scan performed on 02/06/2014 demonstrates the following: Stable exam. No evidence of recurrent or metastatic carcinoma within  the thorax.  NCCN guidelines for Non-Small Cell Lung Cancer Surveillance are as follows:  A. H+P every 6-12 months and CT of chest every 6 months for the first two years  B. Low-dose spiral CT chest annually after first two years of surveillance CTs   C. PET scan not typically indicated for surveillance  D. Smoking cessation advice, counseling, and pharmacotherapy  Oncologically, she denies any complaints and ROS questioning is negative.   She is down to 1/2 ppd of cigarettes and her husband is on the liver transplant list.   Past Medical History  Diagnosis Date  . Lung cancer   . Anxiety   . Arthritis   . COPD (chronic obstructive pulmonary disease)   . Hypertension   . Diabetes mellitus   . GERD (gastroesophageal reflux disease)   . Adenocarcinoma of lung 02/06/2014    has Adenocarcinoma of lung on her problem list.     is allergic to penicillins.  Ms. Shiroma had no medications administered during this visit.  Past Surgical History  Procedure Laterality Date  . Salpingoophorectomy  lt side    benign 9lb mass removed and a massive wall of smaller tumors removed  . Cholecystectomy    .  Appendectomy    . Oophorectomy      (left)  . Lobectomy      Left    Denies any headaches, dizziness, double vision, fevers, chills, night sweats, nausea, vomiting, diarrhea, constipation, chest pain, heart palpitations, shortness of breath, blood in stool, black tarry stool, urinary pain, urinary burning, urinary frequency, hematuria.   PHYSICAL EXAMINATION  ECOG PERFORMANCE STATUS: 0 - Asymptomatic  Filed Vitals:   02/07/14 0916  BP: 92/59  Pulse: 80  Temp: 97.8 F (36.6 C)  Resp: 16    GENERAL:alert, no distress, well nourished, well developed, comfortable, cooperative, obese, smiling and chronically ill appearing, appears older than stated age, and thickened facial skin. SKIN: skin color, texture, turgor are normal, no rashes or significant lesions HEAD: Normocephalic, No masses, lesions, tenderness or abnormalities EYES: normal, PERRLA, EOMI, Conjunctiva are pink and non-injected EARS: External ears normal OROPHARYNX:mucous membranes are moist  NECK: supple, no adenopathy, thyroid normal size, non-tender, without nodularity, no stridor, non-tender, trachea midline LYMPH:  no palpable lymphadenopathy, no hepatosplenomegaly BREAST:not examined LUNGS: clear to auscultation and percussion, decreased breath sounds HEART: regular rate & rhythm, no murmurs, no gallops, S1 normal and S2 normal ABDOMEN:abdomen soft, non-tender, obese, normal bowel sounds, no masses or organomegaly and no hepatosplenomegaly BACK: Back symmetric, no curvature., No CVA tenderness EXTREMITIES:less then 2 second capillary refill, no joint deformities, effusion, or inflammation, no edema, no skin discoloration, no clubbing, no cyanosis  NEURO: alert & oriented x 3 with fluent  speech, no focal motor/sensory deficits, gait normal   LABORATORY DATA: CBC    Component Value Date/Time   WBC 10.7* 09/10/2008 0653   RBC 4.75 09/10/2008 0653   HGB 14.3 09/10/2008 0653   HCT 42.2 09/10/2008 0653   PLT 387  09/10/2008 0653   MCV 88.8 09/10/2008 0653   MCHC 33.9 09/10/2008 0653   RDW 12.7 09/10/2008 0653   LYMPHSABS 2.9 09/10/2008 0653   MONOABS 0.8 09/10/2008 0653   EOSABS 0.2 09/10/2008 0653   BASOSABS 0.0 09/10/2008 0653      RADIOGRAPHIC STUDIES:  02/06/2014  CLINICAL DATA: Followup left lung bronchoalveolar carcinoma.  Previous surgery.  EXAM:  CT CHEST WITHOUT CONTRAST  TECHNIQUE:  Multidetector CT imaging of the chest was performed following the  standard protocol without IV contrast.  COMPARISON: 01/23/2013  FINDINGS:  Postsurgical changes from previous left upper lobectomy remains  stable. No suspicious pulmonary nodules or masses are identified on  today's study. Several tiny 2-3 mm nodules in the right upper lobe  remains stable, as well as nodular interstitial prominence in the  peripheral right upper lobe. No evidence of pulmonary infiltrate or  central endobronchial lesion.  No evidence of pleural or pericardial effusion. No evidence of hilar  or mediastinal lymphadenopathy. No adenopathy seen elsewhere within  the thorax.  Both adrenal glands are normal in appearance. No suspicious bone  lesions identified.  IMPRESSION:  Stable exam. No evidence of recurrent or metastatic carcinoma within  the thorax.  Electronically Signed  By: Earle Gell M.D.  On: 02/06/2014 13:43     ASSESSMENT:  1. History of stage IA bronchoalveolar carcinoma of the lung, status  post left upper lobectomy on 03/23/2005 by Dr. Arlyce Dice.  2. Longstanding smoking history since age 8, still smoking and she is down to 1/2 ppd.   Patient Active Problem List   Diagnosis Date Noted  . Adenocarcinoma of lung 02/06/2014     PLAN:  1. I personally reviewed and went over radiographic studies with the patient.  The results are noted within this dictation.   2. Review of NCCN guidelines 3. Low-dose spiral CT of chest without contrast in 1 year 4. Smoking cessation education provided 5. Return in 1  year for follow-up   THERAPY PLAN:  NCCN guidelines for Non-Small Cell Lung Cancer Surveillance are as follows:  A. H+P every 6-12 months and CT of chest every 6 months for the first two years  B. Low-dose spiral CT chest annually after first two years of surveillance CTs   C. PET scan not typically indicated for surveillance  D. Smoking cessation advice, counseling, and pharmacotherapy   All questions were answered. The patient knows to call the clinic with any problems, questions or concerns. We can certainly see the patient much sooner if necessary.  Patient and plan discussed with Dr. Farrel Gobble and he is in agreement with the aforementioned.   [facility] 02/07/2014

## 2014-02-07 ENCOUNTER — Encounter (HOSPITAL_COMMUNITY): Payer: Self-pay | Admitting: Oncology

## 2014-02-07 ENCOUNTER — Ambulatory Visit (HOSPITAL_COMMUNITY)
Admission: RE | Admit: 2014-02-07 | Discharge: 2014-02-07 | Disposition: A | Discharge status: Home / Self Care | Payer: Medicare Other | Source: Ambulatory Visit | Attending: Internal Medicine | Admitting: Internal Medicine

## 2014-02-07 ENCOUNTER — Encounter (HOSPITAL_COMMUNITY): Payer: Medicare Other | Attending: Oncology | Admitting: Oncology

## 2014-02-07 VITALS — BP 92/59 | HR 80 | Temp 97.8°F | Resp 16 | Wt 162.2 lb

## 2014-02-07 DIAGNOSIS — C3490 Malignant neoplasm of unspecified part of unspecified bronchus or lung: Secondary | ICD-10-CM

## 2014-02-07 DIAGNOSIS — Z1231 Encounter for screening mammogram for malignant neoplasm of breast: Secondary | ICD-10-CM | POA: Diagnosis not present

## 2014-02-07 DIAGNOSIS — Z85118 Personal history of other malignant neoplasm of bronchus and lung: Secondary | ICD-10-CM

## 2014-02-07 DIAGNOSIS — F17200 Nicotine dependence, unspecified, uncomplicated: Secondary | ICD-10-CM | POA: Diagnosis not present

## 2014-02-07 NOTE — Patient Instructions (Signed)
[facility] Discharge Instructions  RECOMMENDATIONS MADE BY THE CONSULTANT AND ANY TEST RESULTS WILL BE SENT TO YOUR REFERRING PHYSICIAN.  EXAM FINDINGS BY THE PHYSICIAN TODAY AND SIGNS OR SYMPTOMS TO REPORT TO CLINIC OR PRIMARY PHYSICIAN: Exam and findings as discussed by Robynn Pane, PA-C.  Scans were ok.  Follow-up with primary care physician regarding smoking cessation.  Report increased shortness of breath or other problems.  MEDICATIONS PRESCRIBED:  none  INSTRUCTIONS/FOLLOW-UP: Follow-up in 1 year with CT of chest and office visit.  Thank you for choosing [facility] to provide your oncology and hematology care.  To afford each patient quality time with our providers, please arrive at least 15 minutes before your scheduled appointment time.  With your help, our goal is to use those 15 minutes to complete the necessary work-up to ensure our physicians have the information they need to help with your evaluation and healthcare recommendations.    Effective January 1st, 2014, we ask that you re-schedule your appointment with our physicians should you arrive 10 or more minutes late for your appointment.  We strive to give you quality time with our providers, and arriving late affects you and other patients whose appointments are after yours.    Again, thank you for choosing [facility].  Our hope is that these requests will decrease the amount of time that you wait before being seen by our physicians.       _____________________________________________________________  Should you have questions after your visit to [facility], please contact our office at [phone] between the hours of 8:30 a.m. and 5:00 p.m.  Voicemails left after 4:30 p.m. will not be returned until the following business day.  For prescription refill requests, have your pharmacy contact our office with your prescription refill request.

## 2014-04-22 DIAGNOSIS — Z6826 Body mass index (BMI) 26.0-26.9, adult: Secondary | ICD-10-CM | POA: Diagnosis not present

## 2014-04-22 DIAGNOSIS — E669 Obesity, unspecified: Secondary | ICD-10-CM | POA: Diagnosis not present

## 2014-04-22 DIAGNOSIS — Z713 Dietary counseling and surveillance: Secondary | ICD-10-CM | POA: Diagnosis not present

## 2014-04-22 DIAGNOSIS — E1149 Type 2 diabetes mellitus with other diabetic neurological complication: Secondary | ICD-10-CM | POA: Diagnosis not present

## 2014-04-22 DIAGNOSIS — E119 Type 2 diabetes mellitus without complications: Secondary | ICD-10-CM | POA: Diagnosis not present

## 2014-04-22 DIAGNOSIS — E109 Type 1 diabetes mellitus without complications: Secondary | ICD-10-CM | POA: Diagnosis not present

## 2014-04-22 DIAGNOSIS — E785 Hyperlipidemia, unspecified: Secondary | ICD-10-CM | POA: Diagnosis not present

## 2014-04-22 DIAGNOSIS — F411 Generalized anxiety disorder: Secondary | ICD-10-CM | POA: Diagnosis not present

## 2014-08-12 DIAGNOSIS — G894 Chronic pain syndrome: Secondary | ICD-10-CM | POA: Diagnosis not present

## 2014-08-12 DIAGNOSIS — Z6828 Body mass index (BMI) 28.0-28.9, adult: Secondary | ICD-10-CM | POA: Diagnosis not present

## 2014-08-12 DIAGNOSIS — E663 Overweight: Secondary | ICD-10-CM | POA: Diagnosis not present

## 2014-08-12 DIAGNOSIS — F419 Anxiety disorder, unspecified: Secondary | ICD-10-CM | POA: Diagnosis not present

## 2014-08-12 DIAGNOSIS — Z23 Encounter for immunization: Secondary | ICD-10-CM | POA: Diagnosis not present

## 2014-10-16 DIAGNOSIS — E119 Type 2 diabetes mellitus without complications: Secondary | ICD-10-CM | POA: Diagnosis not present

## 2014-11-08 DIAGNOSIS — G894 Chronic pain syndrome: Secondary | ICD-10-CM | POA: Diagnosis not present

## 2014-11-08 DIAGNOSIS — J209 Acute bronchitis, unspecified: Secondary | ICD-10-CM | POA: Diagnosis not present

## 2014-11-08 DIAGNOSIS — I10 Essential (primary) hypertension: Secondary | ICD-10-CM | POA: Diagnosis not present

## 2014-11-08 DIAGNOSIS — J0100 Acute maxillary sinusitis, unspecified: Secondary | ICD-10-CM | POA: Diagnosis not present

## 2014-11-08 DIAGNOSIS — E1165 Type 2 diabetes mellitus with hyperglycemia: Secondary | ICD-10-CM | POA: Diagnosis not present

## 2014-11-08 DIAGNOSIS — Z6827 Body mass index (BMI) 27.0-27.9, adult: Secondary | ICD-10-CM | POA: Diagnosis not present

## 2015-01-22 DIAGNOSIS — G894 Chronic pain syndrome: Secondary | ICD-10-CM | POA: Diagnosis not present

## 2015-01-22 DIAGNOSIS — Z6827 Body mass index (BMI) 27.0-27.9, adult: Secondary | ICD-10-CM | POA: Diagnosis not present

## 2015-01-22 DIAGNOSIS — J309 Allergic rhinitis, unspecified: Secondary | ICD-10-CM | POA: Diagnosis not present

## 2015-01-22 DIAGNOSIS — E663 Overweight: Secondary | ICD-10-CM | POA: Diagnosis not present

## 2015-01-22 DIAGNOSIS — I10 Essential (primary) hypertension: Secondary | ICD-10-CM | POA: Diagnosis not present

## 2015-02-06 ENCOUNTER — Other Ambulatory Visit (HOSPITAL_COMMUNITY): Payer: Self-pay | Admitting: Oncology

## 2015-02-06 ENCOUNTER — Ambulatory Visit (HOSPITAL_COMMUNITY)
Admission: RE | Admit: 2015-02-06 | Discharge: 2015-02-06 | Disposition: A | Discharge status: Home / Self Care | Payer: Medicare Other | Source: Ambulatory Visit | Attending: Oncology | Admitting: Oncology

## 2015-02-06 DIAGNOSIS — R591 Generalized enlarged lymph nodes: Secondary | ICD-10-CM | POA: Diagnosis not present

## 2015-02-06 DIAGNOSIS — I2510 Atherosclerotic heart disease of native coronary artery without angina pectoris: Secondary | ICD-10-CM | POA: Diagnosis not present

## 2015-02-06 DIAGNOSIS — Z9889 Other specified postprocedural states: Secondary | ICD-10-CM | POA: Diagnosis not present

## 2015-02-06 DIAGNOSIS — C3490 Malignant neoplasm of unspecified part of unspecified bronchus or lung: Secondary | ICD-10-CM | POA: Insufficient documentation

## 2015-02-06 DIAGNOSIS — Z902 Acquired absence of lung [part of]: Secondary | ICD-10-CM | POA: Diagnosis not present

## 2015-02-06 DIAGNOSIS — C3412 Malignant neoplasm of upper lobe, left bronchus or lung: Secondary | ICD-10-CM | POA: Diagnosis not present

## 2015-02-06 NOTE — Assessment & Plan Note (Addendum)
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 Arlyce Dice.   Longstanding smoking history since age 50, still smoking and she is down to 1/2 ppd.  Smoking cessation education provided.  CT chest, low-dose spiral in 12 months.  Follow-up with primary care provider as directed.  Return in 12 months for follow-up.

## 2015-02-06 NOTE — Progress Notes (Signed)
[address]lo [address] MD [place] [address]  Adenocarcinoma of lung, left - Plan: EPIPEN 2-PAK 0.3 MG/0.3ML SOAJ injection, fluticasone (FLONASE) 50 MCG/ACT nasal spray, glipiZIDE (GLUCOTROL) 5 MG tablet, CT Chest Low Dose F/U Screening Wo Contrast  CURRENT THERAPY: Surveillance per NCCN guidelines  INTERVAL HISTORY: Marissa [name] 49 y.o. female returns for followup of Stage IA adenocarcinoma, bronchoaveolar type, of left upper lung. S/P left upper lobectomy on 03/23/2005 by Dr. Arlyce Dice.     Adenocarcinoma of lung   03/23/2005 Initial Diagnosis Adenocarcinoma of lung with left upper lobe wedge resection by Dr. Arlyce Dice measuring 1.4 cm.  0/4 lymph nodes.     I personally reviewed and went over laboratory results with the patient.  The results are noted within this dictation.  We will get updated labs from her primary care provider.  I personally reviewed and went over radiographic studies with the patient.  The results are noted within this dictation.  CT of chest was negative for any findings concerning for recurrence of disease.  Oncologically, she denies any complaints and ROS questioning is negative.  Past Medical History  Diagnosis Date  . Lung cancer   . Anxiety   . Arthritis   . COPD (chronic obstructive pulmonary disease)   . Hypertension   . Diabetes mellitus   . GERD (gastroesophageal reflux disease)   . Adenocarcinoma of lung 02/06/2014    has Adenocarcinoma of lung on her problem list.     is allergic to penicillins.  [name] had no medications administered during this visit.  Past Surgical History  Procedure Laterality Date  . Salpingoophorectomy  lt side    benign 9lb mass removed and a massive wall of smaller tumors removed  . Cholecystectomy    . Appendectomy    . Oophorectomy      (left)  . Lobectomy      Left    Denies any headaches, dizziness, double vision, fevers, chills, night sweats, nausea, vomiting,  diarrhea, constipation, chest pain, heart palpitations, shortness of breath, blood in stool, black tarry stool, urinary pain, urinary burning, urinary frequency, hematuria.   PHYSICAL EXAMINATION  ECOG PERFORMANCE STATUS: 0 - Asymptomatic  Filed Vitals:   02/07/15 0919  BP: 115/74  Pulse: 83  Temp: 98 F (36.7 C)  Resp: 16    GENERAL:alert, no distress, well nourished, well developed, comfortable, cooperative, obese, smiling and chronically ill appearing, accompanied by husband SKIN: skin color, texture, turgor are normal, no rashes or significant lesions HEAD: Normocephalic, No masses, lesions, tenderness or abnormalities EYES: normal, PERRLA, EOMI, Conjunctiva are pink and non-injected, Xanthomas of medial orbit margin.  EARS: External ears normal OROPHARYNX:lips, buccal mucosa, and tongue normal and mucous membranes are moist  NECK: supple, no adenopathy, thyroid normal size, non-tender, without nodularity, trachea midline LYMPH:  no palpable lymphadenopathy, no hepatosplenomegaly BREAST:not examined LUNGS: clear to auscultation and percussion HEART: regular rate & rhythm, no murmurs, no gallops, S1 normal and S2 normal ABDOMEN:abdomen soft, non-tender and normal bowel sounds BACK: Back symmetric, no curvature. EXTREMITIES:less then 2 second capillary refill, no joint deformities, effusion, or inflammation, no skin discoloration, no cyanosis  NEURO: alert & oriented x 3 with fluent speech, no focal motor/sensory deficits, gait normal   LABORATORY DATA: CBC    Component Value Date/Time   WBC 10.7* 09/10/2008 0653   RBC 4.75 09/10/2008 0653   HGB 14.3 09/10/2008 0653   HCT 42.2 09/10/2008 0653   PLT 387 09/10/2008  0653   MCV 88.8 09/10/2008 0653   MCHC 33.9 09/10/2008 0653   RDW 12.7 09/10/2008 0653   LYMPHSABS 2.9 09/10/2008 0653   MONOABS 0.8 09/10/2008 0653   EOSABS 0.2 09/10/2008 0653   BASOSABS 0.0 09/10/2008 0653      Chemistry      Component Value Date/Time    NA 136 09/10/2008 0653   K 3.8 09/10/2008 0653   CL 105 09/10/2008 0653   CO2 24 09/10/2008 0653   BUN 4* 09/10/2008 0653   CREATININE 0.63 09/10/2008 0653      Component Value Date/Time   CALCIUM 8.6 09/10/2008 0653   ALKPHOS 116 09/10/2008 0653   AST 18 09/10/2008 0653   ALT 20 09/10/2008 0653   BILITOT 0.3 09/10/2008 0653       RADIOGRAPHIC STUDIES:  Ct Chest Nodule Follow Up Low Dose W/o  02/06/2015   CLINICAL DATA:  49 year old female with history of lung cancer status post left upper lobectomy. Followup evaluation.  EXAM: CT CHEST WITHOUT CONTRAST  TECHNIQUE: Multidetector CT imaging of the chest was performed following the standard protocol without IV contrast.  COMPARISON:  Chest CT 02/06/2014.  FINDINGS: Mediastinum/Lymph Nodes: Heart size is normal. There is no significant pericardial fluid, thickening or pericardial calcification. There is atherosclerosis of the thoracic aorta, the great vessels of the mediastinum and the coronary arteries, including calcified atherosclerotic plaque in the left anterior descending, left circumflex and right coronary arteries. No pathologically enlarged mediastinal or hilar lymph nodes. Please note that accurate exclusion of hilar adenopathy is limited on noncontrast CT scans. Esophagus is unremarkable in appearance. No axillary lymphadenopathy.  Lungs/Pleura: Status post left upper lobectomy. Compensatory hyperexpansion of the left lower lobe. Stable mild postoperative scarring in the periphery of the left lower lobe. Several tiny pulmonary nodules are again noted in the right upper lobe, generally 2-3 mm in size. In addition, there is a 6 x 5 mm ground-glass attenuation nodule in the apex of the right upper lobe (image 23 of series 3), which is unchanged in retrospect compared to the prior examination allowing for differences in technique (2 mm thick slices on today's examination versus 5 mm thick slices on the prior studies). No other new  suspicious appearing pulmonary nodules or masses are otherwise noted. No acute consolidative airspace disease. No pleural effusions.  Upper Abdomen: Unremarkable.  Musculoskeletal/Soft Tissues: There are no aggressive appearing lytic or blastic lesions noted in the visualized portions of the skeleton.  IMPRESSION: 1. Stable examination demonstrating multiple tiny 2-3 mm pulmonary nodules in the right upper lobe which are likely benign areas of mucoid impaction in terminal bronchioles, in addition to an unchanged 6 x 5 mm ground-glass attenuation nodule in the apex of the right upper lobe. Continued attention on annual followup examinations is recommended to ensure continued stability. 2. Status post left upper lobectomy. No findings to suggest local recurrence of disease. 3. Atherosclerosis, including three-vessel coronary artery disease. Please note that although the presence of coronary artery calcium documents the presence of coronary artery disease, the severity of this disease and any potential stenosis cannot be assessed on this non-gated CT examination. Assessment for potential risk factor modification, dietary therapy or pharmacologic therapy may be warranted, if clinically indicated.   Electronically Signed   By: Vinnie Langton M.D.   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 Arlyce Dice.   Longstanding smoking history since age 73, still smoking and she is down to 1/2 ppd.  Smoking cessation education provided.  CT chest, low-dose spiral in 12 months.  Follow-up with primary care provider as directed.  Return in 12 months for follow-up.    THERAPY PLAN:  NCCN guidelines for Non-Small Cell Lung Cancer Surveillance are as follows:  A. H+P every 6-12 months and CT of chest every 6 months for the first two years  B. Low-dose spiral CT chest annually  after first two years of surveillance CTs   C. PET scan not typically indicated for surveillance  D. Smoking cessation advice, counseling, and pharmacotherapy   All questions were answered. The patient knows to call the clinic with any problems, questions or concerns. We can certainly see the patient much sooner if necessary.  Patient and plan discussed with Dr. Ancil Linsey and she is in agreement with the aforementioned.   This note is electronically signed by: Robynn Pane 02/07/2015 9:49 AM

## 2015-02-07 ENCOUNTER — Encounter (HOSPITAL_COMMUNITY): Payer: Medicare Other | Attending: Oncology | Admitting: Oncology

## 2015-02-07 ENCOUNTER — Encounter (HOSPITAL_COMMUNITY): Payer: Self-pay | Admitting: Oncology

## 2015-02-07 VITALS — BP 115/74 | HR 83 | Temp 98.0°F | Resp 16 | Wt 172.6 lb

## 2015-02-07 DIAGNOSIS — Z720 Tobacco use: Secondary | ICD-10-CM

## 2015-02-07 DIAGNOSIS — Z85118 Personal history of other malignant neoplasm of bronchus and lung: Secondary | ICD-10-CM

## 2015-02-07 DIAGNOSIS — E119 Type 2 diabetes mellitus without complications: Secondary | ICD-10-CM | POA: Diagnosis not present

## 2015-02-07 DIAGNOSIS — C3492 Malignant neoplasm of unspecified part of left bronchus or lung: Secondary | ICD-10-CM

## 2015-02-07 NOTE — Patient Instructions (Signed)
[place] at [facility] Discharge Instructions  RECOMMENDATIONS MADE BY THE CONSULTANT AND ANY TEST RESULTS WILL BE SENT TO YOUR REFERRING PHYSICIAN.  Exam and discussion by Robynn Pane, PA-C. CT of chest is stable We will get your labs from Dr. Nolon Rod office. Report unexplained weight loss, increased shortness of breath or other concerns.  Follow-up in 1 year with CT of chest and office visit.  Thank you for choosing [place] at [facility] to provide your oncology and hematology care.  To afford each patient quality time with our provider, please arrive at least 15 minutes before your scheduled appointment time.    You need to re-schedule your appointment should you arrive 10 or more minutes late.  We strive to give you quality time with our providers, and arriving late affects you and other patients whose appointments are after yours.  Also, if you no show three or more times for appointments you may be dismissed from the clinic at the providers discretion.     Again, thank you for choosing [facility].  Our hope is that these requests will decrease the amount of time that you wait before being seen by our physicians.       _____________________________________________________________  Should you have questions after your visit to [facility], please contact our office at [phone] between the hours of 8:30 a.m. and 4:30 p.m.  Voicemails left after 4:30 p.m. will not be returned until the following business day.  For prescription refill requests, have your pharmacy contact our office.

## 2015-03-26 DIAGNOSIS — N3946 Mixed incontinence: Secondary | ICD-10-CM | POA: Diagnosis not present

## 2015-03-26 DIAGNOSIS — R350 Frequency of micturition: Secondary | ICD-10-CM | POA: Diagnosis not present

## 2015-03-31 ENCOUNTER — Other Ambulatory Visit (HOSPITAL_COMMUNITY): Payer: Self-pay | Admitting: Internal Medicine

## 2015-03-31 DIAGNOSIS — C3490 Malignant neoplasm of unspecified part of unspecified bronchus or lung: Secondary | ICD-10-CM | POA: Diagnosis not present

## 2015-03-31 DIAGNOSIS — Z0000 Encounter for general adult medical examination without abnormal findings: Secondary | ICD-10-CM | POA: Diagnosis not present

## 2015-03-31 DIAGNOSIS — Z1231 Encounter for screening mammogram for malignant neoplasm of breast: Secondary | ICD-10-CM

## 2015-03-31 DIAGNOSIS — E119 Type 2 diabetes mellitus without complications: Secondary | ICD-10-CM | POA: Diagnosis not present

## 2015-03-31 DIAGNOSIS — Z6827 Body mass index (BMI) 27.0-27.9, adult: Secondary | ICD-10-CM | POA: Diagnosis not present

## 2015-03-31 DIAGNOSIS — I10 Essential (primary) hypertension: Secondary | ICD-10-CM | POA: Diagnosis not present

## 2015-03-31 DIAGNOSIS — E782 Mixed hyperlipidemia: Secondary | ICD-10-CM | POA: Diagnosis not present

## 2015-03-31 DIAGNOSIS — Z720 Tobacco use: Secondary | ICD-10-CM | POA: Diagnosis not present

## 2015-03-31 DIAGNOSIS — E1140 Type 2 diabetes mellitus with diabetic neuropathy, unspecified: Secondary | ICD-10-CM | POA: Diagnosis not present

## 2015-03-31 DIAGNOSIS — E663 Overweight: Secondary | ICD-10-CM | POA: Diagnosis not present

## 2015-03-31 DIAGNOSIS — G894 Chronic pain syndrome: Secondary | ICD-10-CM | POA: Diagnosis not present

## 2015-04-02 DIAGNOSIS — Z1211 Encounter for screening for malignant neoplasm of colon: Secondary | ICD-10-CM | POA: Diagnosis not present

## 2015-04-07 ENCOUNTER — Ambulatory Visit (HOSPITAL_COMMUNITY): Payer: Medicare Other

## 2015-04-29 ENCOUNTER — Encounter: Payer: Self-pay | Admitting: *Deleted

## 2015-05-28 ENCOUNTER — Ambulatory Visit (HOSPITAL_COMMUNITY)
Admission: RE | Admit: 2015-05-28 | Discharge: 2015-05-28 | Disposition: A | Discharge status: Home / Self Care | Payer: Medicare Other | Source: Ambulatory Visit | Attending: Internal Medicine | Admitting: Internal Medicine

## 2015-05-28 DIAGNOSIS — Z1231 Encounter for screening mammogram for malignant neoplasm of breast: Secondary | ICD-10-CM

## 2015-06-06 ENCOUNTER — Encounter: Payer: Self-pay | Admitting: Cardiovascular Disease

## 2015-07-14 DIAGNOSIS — E119 Type 2 diabetes mellitus without complications: Secondary | ICD-10-CM | POA: Diagnosis not present

## 2015-07-14 DIAGNOSIS — F419 Anxiety disorder, unspecified: Secondary | ICD-10-CM | POA: Diagnosis not present

## 2015-07-14 DIAGNOSIS — G894 Chronic pain syndrome: Secondary | ICD-10-CM | POA: Diagnosis not present

## 2015-07-14 DIAGNOSIS — Z719 Counseling, unspecified: Secondary | ICD-10-CM | POA: Diagnosis not present

## 2015-07-14 DIAGNOSIS — Z23 Encounter for immunization: Secondary | ICD-10-CM | POA: Diagnosis not present

## 2015-07-14 DIAGNOSIS — Z1389 Encounter for screening for other disorder: Secondary | ICD-10-CM | POA: Diagnosis not present

## 2015-07-14 DIAGNOSIS — E1140 Type 2 diabetes mellitus with diabetic neuropathy, unspecified: Secondary | ICD-10-CM | POA: Diagnosis not present

## 2015-07-14 DIAGNOSIS — Z6827 Body mass index (BMI) 27.0-27.9, adult: Secondary | ICD-10-CM | POA: Diagnosis not present

## 2015-07-14 DIAGNOSIS — E663 Overweight: Secondary | ICD-10-CM | POA: Diagnosis not present

## 2016-02-03 ENCOUNTER — Other Ambulatory Visit (HOSPITAL_COMMUNITY): Payer: Self-pay | Admitting: Oncology

## 2016-02-03 DIAGNOSIS — C3492 Malignant neoplasm of unspecified part of left bronchus or lung: Secondary | ICD-10-CM

## 2016-02-04 ENCOUNTER — Ambulatory Visit (HOSPITAL_COMMUNITY): Admission: RE | Admit: 2016-02-04 | Payer: Medicare HMO | Source: Ambulatory Visit

## 2016-02-06 ENCOUNTER — Ambulatory Visit (HOSPITAL_COMMUNITY): Payer: Medicare HMO

## 2016-02-09 ENCOUNTER — Other Ambulatory Visit (HOSPITAL_COMMUNITY): Payer: Medicare Other

## 2016-02-09 ENCOUNTER — Ambulatory Visit (HOSPITAL_COMMUNITY): Payer: Medicare Other | Admitting: Oncology

## 2016-02-10 ENCOUNTER — Ambulatory Visit (HOSPITAL_COMMUNITY): Payer: Medicare Other | Admitting: Oncology

## 2016-02-10 ENCOUNTER — Other Ambulatory Visit (HOSPITAL_COMMUNITY): Payer: Medicare Other

## 2016-03-29 ENCOUNTER — Ambulatory Visit (HOSPITAL_COMMUNITY): Payer: Medicare HMO

## 2016-04-15 DIAGNOSIS — E1140 Type 2 diabetes mellitus with diabetic neuropathy, unspecified: Secondary | ICD-10-CM | POA: Diagnosis not present

## 2016-04-15 DIAGNOSIS — Z6827 Body mass index (BMI) 27.0-27.9, adult: Secondary | ICD-10-CM | POA: Diagnosis not present

## 2016-04-15 DIAGNOSIS — C3490 Malignant neoplasm of unspecified part of unspecified bronchus or lung: Secondary | ICD-10-CM | POA: Diagnosis not present

## 2016-04-15 DIAGNOSIS — L84 Corns and callosities: Secondary | ICD-10-CM | POA: Diagnosis not present

## 2016-04-15 DIAGNOSIS — R201 Hypoesthesia of skin: Secondary | ICD-10-CM | POA: Diagnosis not present

## 2016-04-15 DIAGNOSIS — E663 Overweight: Secondary | ICD-10-CM | POA: Diagnosis not present

## 2016-04-15 DIAGNOSIS — J45909 Unspecified asthma, uncomplicated: Secondary | ICD-10-CM | POA: Diagnosis not present

## 2016-04-19 DIAGNOSIS — R69 Illness, unspecified: Secondary | ICD-10-CM | POA: Diagnosis not present

## 2016-05-03 ENCOUNTER — Ambulatory Visit (HOSPITAL_COMMUNITY): Payer: Medicare Other | Admitting: Oncology

## 2016-05-17 DIAGNOSIS — R69 Illness, unspecified: Secondary | ICD-10-CM | POA: Diagnosis not present

## 2016-06-17 DIAGNOSIS — H40051 Ocular hypertension, right eye: Secondary | ICD-10-CM | POA: Diagnosis not present

## 2016-06-17 DIAGNOSIS — Z0100 Encounter for examination of eyes and vision without abnormal findings: Secondary | ICD-10-CM | POA: Diagnosis not present

## 2016-06-17 DIAGNOSIS — H40052 Ocular hypertension, left eye: Secondary | ICD-10-CM | POA: Diagnosis not present

## 2016-06-17 DIAGNOSIS — E119 Type 2 diabetes mellitus without complications: Secondary | ICD-10-CM | POA: Diagnosis not present

## 2016-06-17 DIAGNOSIS — D3101 Benign neoplasm of right conjunctiva: Secondary | ICD-10-CM | POA: Diagnosis not present

## 2016-07-20 DIAGNOSIS — E1140 Type 2 diabetes mellitus with diabetic neuropathy, unspecified: Secondary | ICD-10-CM | POA: Diagnosis not present

## 2016-07-20 DIAGNOSIS — L84 Corns and callosities: Secondary | ICD-10-CM | POA: Diagnosis not present

## 2016-07-20 DIAGNOSIS — E663 Overweight: Secondary | ICD-10-CM | POA: Diagnosis not present

## 2016-07-20 DIAGNOSIS — E119 Type 2 diabetes mellitus without complications: Secondary | ICD-10-CM | POA: Diagnosis not present

## 2016-07-20 DIAGNOSIS — G894 Chronic pain syndrome: Secondary | ICD-10-CM | POA: Diagnosis not present

## 2016-07-20 DIAGNOSIS — C3490 Malignant neoplasm of unspecified part of unspecified bronchus or lung: Secondary | ICD-10-CM | POA: Diagnosis not present

## 2016-07-20 DIAGNOSIS — Z1389 Encounter for screening for other disorder: Secondary | ICD-10-CM | POA: Diagnosis not present

## 2016-07-20 DIAGNOSIS — R201 Hypoesthesia of skin: Secondary | ICD-10-CM | POA: Diagnosis not present

## 2016-07-20 DIAGNOSIS — I10 Essential (primary) hypertension: Secondary | ICD-10-CM | POA: Diagnosis not present

## 2016-07-20 DIAGNOSIS — Z6828 Body mass index (BMI) 28.0-28.9, adult: Secondary | ICD-10-CM | POA: Diagnosis not present

## 2016-11-27 DIAGNOSIS — Z79891 Long term (current) use of opiate analgesic: Secondary | ICD-10-CM | POA: Diagnosis not present

## 2016-11-27 DIAGNOSIS — K219 Gastro-esophageal reflux disease without esophagitis: Secondary | ICD-10-CM | POA: Diagnosis not present

## 2016-11-27 DIAGNOSIS — M47812 Spondylosis without myelopathy or radiculopathy, cervical region: Secondary | ICD-10-CM | POA: Diagnosis not present

## 2016-11-27 DIAGNOSIS — Z0000 Encounter for general adult medical examination without abnormal findings: Secondary | ICD-10-CM | POA: Diagnosis not present

## 2016-11-27 DIAGNOSIS — E119 Type 2 diabetes mellitus without complications: Secondary | ICD-10-CM | POA: Diagnosis not present

## 2016-11-27 DIAGNOSIS — J449 Chronic obstructive pulmonary disease, unspecified: Secondary | ICD-10-CM | POA: Diagnosis not present

## 2016-11-27 DIAGNOSIS — Z7984 Long term (current) use of oral hypoglycemic drugs: Secondary | ICD-10-CM | POA: Diagnosis not present

## 2016-11-27 DIAGNOSIS — Z6827 Body mass index (BMI) 27.0-27.9, adult: Secondary | ICD-10-CM | POA: Diagnosis not present

## 2016-11-27 DIAGNOSIS — Z972 Presence of dental prosthetic device (complete) (partial): Secondary | ICD-10-CM | POA: Diagnosis not present

## 2016-11-27 DIAGNOSIS — M130 Polyarthritis, unspecified: Secondary | ICD-10-CM | POA: Diagnosis not present

## 2016-11-27 DIAGNOSIS — E7800 Pure hypercholesterolemia, unspecified: Secondary | ICD-10-CM | POA: Diagnosis not present

## 2016-11-27 DIAGNOSIS — R69 Illness, unspecified: Secondary | ICD-10-CM | POA: Diagnosis not present

## 2017-01-03 DIAGNOSIS — K219 Gastro-esophageal reflux disease without esophagitis: Secondary | ICD-10-CM | POA: Diagnosis not present

## 2017-01-03 DIAGNOSIS — E1165 Type 2 diabetes mellitus with hyperglycemia: Secondary | ICD-10-CM | POA: Diagnosis not present

## 2017-01-03 DIAGNOSIS — I10 Essential (primary) hypertension: Secondary | ICD-10-CM | POA: Diagnosis not present

## 2017-01-03 DIAGNOSIS — Z6828 Body mass index (BMI) 28.0-28.9, adult: Secondary | ICD-10-CM | POA: Diagnosis not present

## 2017-01-03 DIAGNOSIS — L84 Corns and callosities: Secondary | ICD-10-CM | POA: Diagnosis not present

## 2017-01-04 DIAGNOSIS — E1140 Type 2 diabetes mellitus with diabetic neuropathy, unspecified: Secondary | ICD-10-CM | POA: Diagnosis not present

## 2017-01-04 DIAGNOSIS — E119 Type 2 diabetes mellitus without complications: Secondary | ICD-10-CM | POA: Diagnosis not present

## 2017-02-16 DIAGNOSIS — N393 Stress incontinence (female) (male): Secondary | ICD-10-CM | POA: Diagnosis not present

## 2017-02-16 DIAGNOSIS — K5289 Other specified noninfective gastroenteritis and colitis: Secondary | ICD-10-CM | POA: Diagnosis not present

## 2017-02-16 DIAGNOSIS — E663 Overweight: Secondary | ICD-10-CM | POA: Diagnosis not present

## 2017-02-16 DIAGNOSIS — Z6826 Body mass index (BMI) 26.0-26.9, adult: Secondary | ICD-10-CM | POA: Diagnosis not present

## 2017-02-16 DIAGNOSIS — N390 Urinary tract infection, site not specified: Secondary | ICD-10-CM | POA: Diagnosis not present

## 2017-03-18 ENCOUNTER — Other Ambulatory Visit (HOSPITAL_COMMUNITY): Payer: Self-pay | Admitting: Adult Health

## 2017-03-18 DIAGNOSIS — C3492 Malignant neoplasm of unspecified part of left bronchus or lung: Secondary | ICD-10-CM

## 2017-04-19 ENCOUNTER — Ambulatory Visit (HOSPITAL_COMMUNITY)
Admission: RE | Admit: 2017-04-19 | Discharge: 2017-04-19 | Disposition: A | Discharge status: Home / Self Care | Payer: Medicare HMO | Source: Ambulatory Visit | Attending: Adult Health | Admitting: Adult Health

## 2017-04-19 DIAGNOSIS — C3492 Malignant neoplasm of unspecified part of left bronchus or lung: Secondary | ICD-10-CM | POA: Diagnosis not present

## 2017-04-19 DIAGNOSIS — R911 Solitary pulmonary nodule: Secondary | ICD-10-CM | POA: Insufficient documentation

## 2017-04-25 ENCOUNTER — Encounter (HOSPITAL_COMMUNITY): Payer: Self-pay | Admitting: Oncology

## 2017-04-25 ENCOUNTER — Encounter (HOSPITAL_COMMUNITY): Payer: Medicare HMO | Attending: Oncology | Admitting: Oncology

## 2017-04-25 VITALS — BP 153/68 | HR 94 | Resp 16 | Ht 65.0 in | Wt 166.0 lb

## 2017-04-25 DIAGNOSIS — Z85118 Personal history of other malignant neoplasm of bronchus and lung: Secondary | ICD-10-CM | POA: Diagnosis not present

## 2017-04-25 DIAGNOSIS — C3492 Malignant neoplasm of unspecified part of left bronchus or lung: Secondary | ICD-10-CM

## 2017-04-25 DIAGNOSIS — R911 Solitary pulmonary nodule: Secondary | ICD-10-CM

## 2017-04-25 DIAGNOSIS — F17200 Nicotine dependence, unspecified, uncomplicated: Secondary | ICD-10-CM | POA: Diagnosis not present

## 2017-04-25 DIAGNOSIS — R69 Illness, unspecified: Secondary | ICD-10-CM | POA: Diagnosis not present

## 2017-04-25 DIAGNOSIS — Z0000 Encounter for general adult medical examination without abnormal findings: Secondary | ICD-10-CM

## 2017-04-25 NOTE — Patient Instructions (Signed)
[place] at [facility] Discharge Instructions  RECOMMENDATIONS MADE BY THE CONSULTANT AND ANY TEST RESULTS WILL BE SENT TO YOUR REFERRING PHYSICIAN.  You were seen today by Kirby Crigler PA-C. Return in 12 months for CT and follow up.   Thank you for choosing [place] at [facility] to provide your oncology and hematology care.  To afford each patient quality time with our provider, please arrive at least 15 minutes before your scheduled appointment time.    If you have a lab appointment with the [facility] please come in thru the  Main Entrance and check in at the main information desk  You need to re-schedule your appointment should you arrive 10 or more minutes late.  We strive to give you quality time with our providers, and arriving late affects you and other patients whose appointments are after yours.  Also, if you no show three or more times for appointments you may be dismissed from the clinic at the providers discretion.     Again, thank you for choosing [facility].  Our hope is that these requests will decrease the amount of time that you wait before being seen by our physicians.       _____________________________________________________________  Should you have questions after your visit to [facility], please contact our office at [phone] between the hours of 8:30 a.m. and 4:30 p.m.  Voicemails left after 4:30 p.m. will not be returned until the following business day.  For prescription refill requests, have your pharmacy contact our office.       Resources For Cancer Patients and their Caregivers ? American Cancer Society: Can assist with transportation, wigs, general needs, runs Look Good Feel Better.        231-172-8149 ? Cancer Care: Provides financial assistance, online support groups, medication/co-pay assistance.  1-800-813-HOPE (929)195-4311) ? [facility] Assists [place] cancer patients and their families through emotional , educational and financial support.  (437)249-7357 ? [place] DSS Where to apply for food stamps, Medicaid and utility assistance. (707) 748-6495 ? [facility]: Transportation to medical appointments. 548-611-3351 ? Social Security Administration: May apply for disability if have a Stage IV cancer. 6062064127 214 060 8844 ? [employer], Disability and Transit Services: Assists with nutrition, care and transit needs. [place] Support Programs: @10RELATIVEDAYS @ > Cancer Support Group  2nd Tuesday of the month 1pm-2pm, Journey Room  > Creative Journey  3rd Tuesday of the month 1130am-1pm, Journey Room  > Look Good Feel Better  1st Wednesday of the month 10am-12 noon, Journey Room (Call [place] to register [phone])

## 2017-04-25 NOTE — Assessment & Plan Note (Addendum)
Stage IA adenocarcinoma, bronchoalveolar type, left upper lung.  Status post left upper lobectomy on 03/24/2015 by Dr. Arlyce Dice. AND Noncompliance-she canceled her annual CT of chest on 02/06/2016, and 03/29/2016.  She canceled her follow-up appointment on 02/09/2016, 02/2016, and 05/03/2016.  She was last seen in 2016.  No oncology role for labs today.  I personally reviewed and went over radiographic studies with the patient.  The results are noted within this dictation.  I personally reviewed the images in PACS.  CT chest on 04/19/2017 reports no sign of recurrence of disease.  There is a small RUL pulmonary nodule that will require ongoing surveillance.  CT chest wo contrast in 12 months.  She is overdue for mammogram, last one in 2016.  She reports that she is not overdue with her last mammogram being in December.  I do not have record of this imaging/surveillance test and therefore, I will defer to PCP.  She continues to smoke 1.5 ppd.  Smoking cessation education is provided.  Return in 12 months for follow-up.

## 2017-04-25 NOTE — Addendum Note (Signed)
Addended by: Farley Ly on: 04/25/2017 09:43 AM   Modules accepted: Orders

## 2017-04-25 NOTE — Progress Notes (Signed)
Redmond School, MD [address] [place] [address]  Adenocarcinoma of left lung [facility]) - Plan: CT Chest Wo Contrast, CANCELED: CT Chest Wo Contrast  Encounter for preventive health examination - Plan: CANCELED: MM Digital Screening  CURRENT THERAPY: Surveillence per NCCN guidelines  INTERVAL HISTORY: Marissa [name] 51 y.o. female returns for followup of Stage IA adenocarcinoma, bronchoalveolar type, left upper lung.  Status post left upper lobectomy on 03/24/2015 by Dr. Arlyce Dice. AND Noncompliance-she canceled her annual CT of chest on 02/06/2016, and 03/29/2016.  She canceled her follow-up appointment on 02/09/2016, 02/2016, and 05/03/2016.  She was last seen in 2016.    Adenocarcinoma of lung ([place])   03/23/2005 Initial Diagnosis    Adenocarcinoma of lung with left upper lobe wedge resection by Dr. Arlyce Dice measuring 1.4 cm.  0/4 lymph nodes.      01/27/2015 Imaging    CT chest- 1. Stable examination demonstrating multiple tiny 2-3 mm pulmonary nodules in the right upper lobe which are likely benign areas of mucoid impaction in terminal bronchioles, in addition to an unchanged 6 x 5 mm ground-glass attenuation nodule in the apex of the right upper lobe. Continued attention on annual followup examinations is recommended to ensure continued stability. 2. Status post left upper lobectomy. No findings to suggest local recurrence of disease. 3. Atherosclerosis, including three-vessel coronary artery disease. Please note that although the presence of coronary artery calcium documents the presence of coronary artery disease, the severity of this disease and any potential stenosis cannot be assessed on this non-gated CT examination. Assessment for potential risk factor modification, dietary therapy or pharmacologic therapy may be warranted, if clinically indicated.      04/19/2017 Imaging    CT chest- 1. No evidence lung cancer recurrence in the LEFT lower lung. 2.  Increase in the ground-glass surrounding a RIGHT upper lobe pulmonary nodule. Minimal size increased over 3 year interval. Recommend follow-up CT in 12 months for part solid nodule. This recommendation follows the consensus statement: Guidelines for Management of Incidental Pulmonary Nodules Detected on CT Images: From the Fleischner Society 2017; Radiology 2017; 284:228-243. These results will be called to the ordering clinician or representative by the Radiologist Assistant, and communication documented in the PACS or zVision Dashboard.        HPI Elements   Location: Left upper lung  Quality: adenocarcinoma  Severity: Stage IA  Duration: Dx June 2016  Context: S/P left upper lobectomy  Timing:   Modifying Factors: Noncompliance  Associated Signs & Symptoms:    She reports her noncompliance with secondary to her brother passing away last year.  Review of Systems  Constitutional: Negative.  Negative for chills, fever and weight loss.  HENT: Negative.   Eyes: Negative.   Respiratory: Negative.  Negative for cough.   Cardiovascular: Negative.  Negative for chest pain.  Gastrointestinal: Positive for constipation and diarrhea. Negative for blood in stool, melena, nausea and vomiting.  Genitourinary: Negative.   Musculoskeletal: Negative.   Skin: Negative.   Neurological: Positive for sensory change (Numbness and burning of feet.). Negative for weakness.  Endo/Heme/Allergies: Negative.   Psychiatric/Behavioral: Negative.     Past Medical History:  Diagnosis Date  . Adenocarcinoma of lung 02/06/2014  . Anxiety   . Arthritis   . COPD (chronic obstructive pulmonary disease)   . Diabetes mellitus   . GERD (gastroesophageal reflux disease)   . Hypertension   . Lung cancer     Past Surgical History:  Procedure Laterality Date  . APPENDECTOMY    . CHOLECYSTECTOMY    . LOBECTOMY Left 2006  . NM MYOCAR IMG MI  05/2008   lexiscan -perfusion defect in anterior myocardium  (breast attenuation); remaining myocardium with NO evidence of ischemia or infarct; EF 78%; low risk scan  . OOPHORECTOMY Left   . PARTIAL HYSTERECTOMY  1985  . SALPINGOOPHORECTOMY Left    benign 9lb mass removed and a massive wall of smaller tumors removed  . TRANSTHORACIC ECHOCARDIOGRAM  05/2008   EF =/>55%, mild MR; trace TR    Family History  Problem Relation Age of Onset  . Colon cancer Mother   . Lung cancer Mother   . Heart disease Mother   . Ovarian cancer Sister   . Heart disease Father   . Hepatitis Brother   . Hernia Brother     Social History   Social History  . Marital status: Married    Spouse name: N/A  . Number of children: N/A  . Years of education: N/A   Social History Main Topics  . Smoking status: Current Every Day Smoker    Packs/day: 2.00    Years: 32.00    Types: Cigarettes  . Smokeless tobacco: Never Used  . Alcohol use No  . Drug use: No  . Sexual activity: Not on file   Other Topics Concern  . Not on file   Social History Narrative  . No narrative on file     PHYSICAL EXAMINATION  ECOG PERFORMANCE STATUS: 1 - Symptomatic but completely ambulatory  There were no vitals filed for this visit.  BP 153/68 P 94 R 16 O2 98% on RA  GENERAL:alert, no distress, well nourished, anxious, comfortable, cooperative, obese, smiling and hyperactive, chronically ill appearing, appears older than stated age, accompanied by husband SKIN: skin color, texture, turgor are normal, no rashes or significant lesions HEAD: Normocephalic, No masses, lesions, tenderness or abnormalities EYES: normal, EOMI, Conjunctiva are pink and non-injected EARS: External ears normal OROPHARYNX:lips, buccal mucosa, and tongue normal and mucous membranes are moist  NECK: supple, trachea midline LYMPH:  no palpable lymphadenopathy BREAST:not examined LUNGS: clear to auscultation , decreased breath sounds HEART: regular rate & rhythm, no murmurs and no  gallops ABDOMEN:abdomen soft, non-tender, obese and normal bowel sounds BACK: Back symmetric, no curvature. EXTREMITIES:less then 2 second capillary refill, no joint deformities, effusion, or inflammation, no skin discoloration, no cyanosis  NEURO: alert & oriented x 3 with fluent speech, no focal motor/sensory deficits, gait normal   LABORATORY DATA: CBC    Component Value Date/Time   WBC 10.7 (H) 09/10/2008 0653   RBC 4.75 09/10/2008 0653   HGB 14.3 09/10/2008 0653   HCT 42.2 09/10/2008 0653   PLT 387 09/10/2008 0653   MCV 88.8 09/10/2008 0653   MCHC 33.9 09/10/2008 0653   RDW 12.7 09/10/2008 0653   LYMPHSABS 2.9 09/10/2008 0653   MONOABS 0.8 09/10/2008 0653   EOSABS 0.2 09/10/2008 0653   BASOSABS 0.0 09/10/2008 0653      Chemistry      Component Value Date/Time   NA 136 09/10/2008 0653   K 3.8 09/10/2008 0653   CL 105 09/10/2008 0653   CO2 24 09/10/2008 0653   BUN 4 (L) 09/10/2008 0653   CREATININE 0.63 09/10/2008 0653      Component Value Date/Time   CALCIUM 8.6 09/10/2008 0653   ALKPHOS 116 09/10/2008 0653   AST 18 09/10/2008 0653   ALT 20 09/10/2008 6073  BILITOT 0.3 09/10/2008 7829        PENDING LABS:   RADIOGRAPHIC STUDIES:  Ct Chest Wo Contrast  Result Date: 04/19/2017 CLINICAL DATA:  LEFT-sided lung cancer with surgery only. EXAM: CT CHEST WITHOUT CONTRAST TECHNIQUE: Multidetector CT imaging of the chest was performed following the standard protocol without IV contrast. COMPARISON:  CT 02/06/2015 FINDINGS: Cardiovascular: Coronary artery calcification and aortic atherosclerotic calcification. Mediastinum/Nodes: No axillary or supraclavicular adenopathy. No mediastinal hilar adenopathy. No pericardial fluid esophagus normal. Lungs/Pleura: Postsurgical change in the LEFT upper lobe. No evidence of local recurrence. Nodule in the superior aspect of the RIGHT upper lobe is increased in peripheral ground-glass opacity in central solid component. The  ground-glass component measures 12 mm and the central component measures 6 (image 26, series 4). Previously lesion was more solid measuring 6 x 5 mm . Several additional subpleural nodules in the RIGHT upper lobe are unchanged. Upper Abdomen: Limited view of the liver, kidneys, pancreas are unremarkable. Normal adrenal glands. Musculoskeletal: No aggressive osseous lesion IMPRESSION: 1. No evidence lung cancer recurrence in the LEFT lower lung. 2. Increase in the ground-glass surrounding a RIGHT upper lobe pulmonary nodule. Minimal size increased over 3 year interval. Recommend follow-up CT in 12 months for part solid nodule. This recommendation follows the consensus statement: Guidelines for Management of Incidental Pulmonary Nodules Detected on CT Images: From the Fleischner Society 2017; Radiology 2017; 284:228-243. These results will be called to the ordering clinician or representative by the Radiologist Assistant, and communication documented in the PACS or zVision Dashboard. Electronically Signed   By: Suzy Bouchard M.D.   On: 04/19/2017 16:22     PATHOLOGY:    ASSESSMENT AND PLAN:  Adenocarcinoma of lung ([facility]) Stage IA adenocarcinoma, bronchoalveolar type, left upper lung.  Status post left upper lobectomy on 03/24/2015 by Dr. Arlyce Dice. AND Noncompliance-she canceled her annual CT of chest on 02/06/2016, and 03/29/2016.  She canceled her follow-up appointment on 02/09/2016, 02/2016, and 05/03/2016.  She was last seen in 2016.  No oncology role for labs today.  I personally reviewed and went over radiographic studies with the patient.  The results are noted within this dictation.  I personally reviewed the images in PACS.  CT chest on 04/19/2017 reports no sign of recurrence of disease.  There is a small RUL pulmonary nodule that will require ongoing surveillance.  CT chest wo contrast in 12 months.  She is overdue for mammogram, last one in 2016.  She reports that she is not overdue with her last  mammogram being in December.  I do not have record of this imaging/surveillance test and therefore, I will defer to PCP.  She continues to smoke 1.5 ppd.  Smoking cessation education is provided.  Return in 12 months for follow-up.   ORDERS PLACED FOR THIS ENCOUNTER: Orders Placed This Encounter  Procedures  . CT Chest Wo Contrast    MEDICATIONS PRESCRIBED THIS ENCOUNTER: No orders of the defined types were placed in this encounter.   THERAPY PLAN:  NCCN guidelines for Non-Small Cell Lung Cancer Surveillance in the setting of clinical/radiographic remission are as follows (4.2018):  A. Stage I-II (primary treatment included surgery +/- chemotherapy):   1. H+P and chest CT +/- contrast every 6 months for 2-3 years, then H+P and low-dose non-contrast-enhanced chest CT annually  B. Stage I-II (primary treatment included RT) or Stage III or Stage IV (oligometastatic with all sites treated with definitive intent)   1. H+P and chest CT +/- contrast every  3-6 months for 3 years, then H+P and chest CT +/- contrast every 6 months for 2 years, then H+P and low-dose non-contrast-enhanced chest CT annually    A. Residual or new radiographic abnormalities may require more frequent imaging  C. Smoking cessation advice, counseling, and pharmacotherapy  D. PET/CT or Brain MRI is not routinely indicated.  All questions were answered. The patient knows to call the clinic with any problems, questions or concerns. We can certainly see the patient much sooner if necessary.  Patient and plan discussed with Dr. Twana First and she is in agreement with the aforementioned.   This note is electronically signed by: Doy Mince 04/25/2017 9:29 AM

## 2017-05-19 DIAGNOSIS — K219 Gastro-esophageal reflux disease without esophagitis: Secondary | ICD-10-CM | POA: Diagnosis not present

## 2017-05-19 DIAGNOSIS — E1142 Type 2 diabetes mellitus with diabetic polyneuropathy: Secondary | ICD-10-CM | POA: Diagnosis not present

## 2017-05-19 DIAGNOSIS — G894 Chronic pain syndrome: Secondary | ICD-10-CM | POA: Diagnosis not present

## 2017-05-19 DIAGNOSIS — J45909 Unspecified asthma, uncomplicated: Secondary | ICD-10-CM | POA: Diagnosis not present

## 2017-05-19 DIAGNOSIS — Z6827 Body mass index (BMI) 27.0-27.9, adult: Secondary | ICD-10-CM | POA: Diagnosis not present

## 2017-05-19 DIAGNOSIS — B351 Tinea unguium: Secondary | ICD-10-CM | POA: Diagnosis not present

## 2017-05-19 DIAGNOSIS — I10 Essential (primary) hypertension: Secondary | ICD-10-CM | POA: Diagnosis not present

## 2017-05-19 DIAGNOSIS — Z1389 Encounter for screening for other disorder: Secondary | ICD-10-CM | POA: Diagnosis not present

## 2017-08-23 DIAGNOSIS — Z0000 Encounter for general adult medical examination without abnormal findings: Secondary | ICD-10-CM | POA: Diagnosis not present

## 2017-08-23 DIAGNOSIS — G4700 Insomnia, unspecified: Secondary | ICD-10-CM | POA: Diagnosis not present

## 2017-08-23 DIAGNOSIS — G894 Chronic pain syndrome: Secondary | ICD-10-CM | POA: Diagnosis not present

## 2017-08-23 DIAGNOSIS — Z23 Encounter for immunization: Secondary | ICD-10-CM | POA: Diagnosis not present

## 2017-08-23 DIAGNOSIS — B353 Tinea pedis: Secondary | ICD-10-CM | POA: Diagnosis not present

## 2017-08-23 DIAGNOSIS — Z6827 Body mass index (BMI) 27.0-27.9, adult: Secondary | ICD-10-CM | POA: Diagnosis not present

## 2017-08-23 DIAGNOSIS — E1140 Type 2 diabetes mellitus with diabetic neuropathy, unspecified: Secondary | ICD-10-CM | POA: Diagnosis not present

## 2017-08-23 DIAGNOSIS — L0100 Impetigo, unspecified: Secondary | ICD-10-CM | POA: Diagnosis not present

## 2017-08-23 DIAGNOSIS — E669 Obesity, unspecified: Secondary | ICD-10-CM | POA: Diagnosis not present

## 2017-08-23 DIAGNOSIS — Z1389 Encounter for screening for other disorder: Secondary | ICD-10-CM | POA: Diagnosis not present

## 2017-08-23 DIAGNOSIS — E663 Overweight: Secondary | ICD-10-CM | POA: Diagnosis not present

## 2017-08-23 DIAGNOSIS — Z01411 Encounter for gynecological examination (general) (routine) with abnormal findings: Secondary | ICD-10-CM | POA: Diagnosis not present

## 2018-01-19 DIAGNOSIS — E663 Overweight: Secondary | ICD-10-CM | POA: Diagnosis not present

## 2018-01-19 DIAGNOSIS — I739 Peripheral vascular disease, unspecified: Secondary | ICD-10-CM | POA: Diagnosis not present

## 2018-01-19 DIAGNOSIS — L03032 Cellulitis of left toe: Secondary | ICD-10-CM | POA: Diagnosis not present

## 2018-01-19 DIAGNOSIS — Z6827 Body mass index (BMI) 27.0-27.9, adult: Secondary | ICD-10-CM | POA: Diagnosis not present

## 2018-01-19 DIAGNOSIS — E1142 Type 2 diabetes mellitus with diabetic polyneuropathy: Secondary | ICD-10-CM | POA: Diagnosis not present

## 2018-01-19 DIAGNOSIS — C3490 Malignant neoplasm of unspecified part of unspecified bronchus or lung: Secondary | ICD-10-CM | POA: Diagnosis not present

## 2018-01-19 DIAGNOSIS — E1140 Type 2 diabetes mellitus with diabetic neuropathy, unspecified: Secondary | ICD-10-CM | POA: Diagnosis not present

## 2018-01-19 DIAGNOSIS — Z1389 Encounter for screening for other disorder: Secondary | ICD-10-CM | POA: Diagnosis not present

## 2018-01-19 DIAGNOSIS — L0390 Cellulitis, unspecified: Secondary | ICD-10-CM | POA: Diagnosis not present

## 2018-01-19 DIAGNOSIS — R201 Hypoesthesia of skin: Secondary | ICD-10-CM | POA: Diagnosis not present

## 2018-01-26 DIAGNOSIS — I739 Peripheral vascular disease, unspecified: Secondary | ICD-10-CM | POA: Diagnosis not present

## 2018-01-26 DIAGNOSIS — Z6827 Body mass index (BMI) 27.0-27.9, adult: Secondary | ICD-10-CM | POA: Diagnosis not present

## 2018-01-26 DIAGNOSIS — M79661 Pain in right lower leg: Secondary | ICD-10-CM | POA: Diagnosis not present

## 2018-01-26 DIAGNOSIS — E663 Overweight: Secondary | ICD-10-CM | POA: Diagnosis not present

## 2018-01-26 DIAGNOSIS — Z1389 Encounter for screening for other disorder: Secondary | ICD-10-CM | POA: Diagnosis not present

## 2018-01-26 DIAGNOSIS — M79662 Pain in left lower leg: Secondary | ICD-10-CM | POA: Diagnosis not present

## 2018-04-20 ENCOUNTER — Ambulatory Visit (HOSPITAL_COMMUNITY)
Admission: RE | Admit: 2018-04-20 | Discharge: 2018-04-20 | Disposition: A | Discharge status: Home / Self Care | Payer: Medicare HMO | Source: Ambulatory Visit | Attending: Adult Health | Admitting: Adult Health

## 2018-04-20 DIAGNOSIS — K760 Fatty (change of) liver, not elsewhere classified: Secondary | ICD-10-CM | POA: Insufficient documentation

## 2018-04-20 DIAGNOSIS — I700 Atherosclerosis of aorta: Secondary | ICD-10-CM | POA: Insufficient documentation

## 2018-04-20 DIAGNOSIS — R918 Other nonspecific abnormal finding of lung field: Secondary | ICD-10-CM | POA: Diagnosis not present

## 2018-04-20 DIAGNOSIS — R911 Solitary pulmonary nodule: Secondary | ICD-10-CM | POA: Diagnosis not present

## 2018-04-20 DIAGNOSIS — C3492 Malignant neoplasm of unspecified part of left bronchus or lung: Secondary | ICD-10-CM | POA: Diagnosis not present

## 2018-04-25 ENCOUNTER — Ambulatory Visit (HOSPITAL_COMMUNITY): Payer: Medicare HMO | Admitting: Hematology

## 2018-04-25 ENCOUNTER — Other Ambulatory Visit (HOSPITAL_COMMUNITY): Payer: Self-pay | Admitting: Internal Medicine

## 2018-04-25 DIAGNOSIS — Z1231 Encounter for screening mammogram for malignant neoplasm of breast: Secondary | ICD-10-CM

## 2018-05-01 ENCOUNTER — Ambulatory Visit (HOSPITAL_COMMUNITY)
Admission: RE | Admit: 2018-05-01 | Discharge: 2018-05-01 | Disposition: A | Discharge status: Home / Self Care | Payer: Medicare HMO | Source: Ambulatory Visit | Attending: Internal Medicine | Admitting: Internal Medicine

## 2018-05-01 DIAGNOSIS — Z1231 Encounter for screening mammogram for malignant neoplasm of breast: Secondary | ICD-10-CM | POA: Diagnosis not present

## 2018-05-02 ENCOUNTER — Encounter (HOSPITAL_COMMUNITY): Payer: Self-pay | Admitting: Hematology

## 2018-05-02 ENCOUNTER — Inpatient Hospital Stay (HOSPITAL_COMMUNITY): Payer: Medicare HMO | Attending: Hematology | Admitting: Hematology

## 2018-05-02 ENCOUNTER — Other Ambulatory Visit: Payer: Self-pay

## 2018-05-02 VITALS — BP 129/81 | HR 96 | Temp 98.2°F | Resp 18 | Wt 166.6 lb

## 2018-05-02 DIAGNOSIS — Z85118 Personal history of other malignant neoplasm of bronchus and lung: Secondary | ICD-10-CM | POA: Insufficient documentation

## 2018-05-02 DIAGNOSIS — I10 Essential (primary) hypertension: Secondary | ICD-10-CM | POA: Diagnosis not present

## 2018-05-02 DIAGNOSIS — Z800 Family history of malignant neoplasm of digestive organs: Secondary | ICD-10-CM | POA: Insufficient documentation

## 2018-05-02 DIAGNOSIS — R69 Illness, unspecified: Secondary | ICD-10-CM | POA: Diagnosis not present

## 2018-05-02 DIAGNOSIS — Z8041 Family history of malignant neoplasm of ovary: Secondary | ICD-10-CM | POA: Diagnosis not present

## 2018-05-02 DIAGNOSIS — F17210 Nicotine dependence, cigarettes, uncomplicated: Secondary | ICD-10-CM | POA: Diagnosis not present

## 2018-05-02 DIAGNOSIS — C3492 Malignant neoplasm of unspecified part of left bronchus or lung: Secondary | ICD-10-CM

## 2018-05-02 NOTE — Patient Instructions (Signed)
[facility] at [facility] Discharge Instructions  Today you saw Dr. K.   Thank you for choosing [facility] at [facility] to provide your oncology and hematology care.  To afford each patient quality time with our provider, please arrive at least 15 minutes before your scheduled appointment time.   If you have a lab appointment with the Cancer Center please come in thru the  Main Entrance and check in at the main information desk  You need to re-schedule your appointment should you arrive 10 or more minutes late.  We strive to give you quality time with our providers, and arriving late affects you and other patients whose appointments are after yours.  Also, if you no show three or more times for appointments you may be dismissed from the clinic at the providers discretion.     Again, thank you for choosing [facility].  Our hope is that these requests will decrease the amount of time that you wait before being seen by our physicians.       _____________________________________________________________  Should you have questions after your visit to [facility], please contact our office at [phone] between the hours of 8:30 a.m. and 4:30 p.m.  Voicemails left after 4:30 p.m. will not be returned until the following business day.  For prescription refill requests, have your pharmacy contact our office.       Resources For Cancer Patients and their Caregivers ? American Cancer Society: Can assist with transportation, wigs, general needs, runs Look Good Feel Better.        1-888-227-6333 ? Cancer Care: Provides financial assistance, online support groups, medication/co-pay assistance.  1-800-813-HOPE (4673) ? [facility] Assists [place] cancer patients and their families through emotional , educational and financial support.  336-427-4357 ? [place] DSS Where to apply for food  stamps, Medicaid and utility assistance. 336-342-1394 ? [facility]: Transportation to medical appointments. 336-347-2287 ? Social Security Administration: May apply for disability if have a Stage IV cancer. 336-342-7796 1-800-772-1213 ? [place] Aging, Disability and Transit Services: Assists with nutrition, care and transit needs. 336-349-2343  Cancer Center Support Programs:   > Cancer Support Group  2nd Tuesday of the month 1pm-2pm, Journey Room   > Creative Journey  3rd Tuesday of the month 1130am-1pm, Journey Room    

## 2018-05-02 NOTE — Assessment & Plan Note (Signed)
1.  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  Reportedly tried Chantix which caused behavioral changes.  Also tried Wellbutrin which made her nauseous.  Reports that she cannot chew Nicorette gum because of her dentures.  We have recommended trying nicotine lozenges. -Discussed the results of the CT scan of the chest without contrast dated 04/20/2018 which did not show any evidence of recurrence.  There is slight interval increase in size of the groundglass opacity in the right upper lobe sub-solid nodule. -We have recommended repeating CT scan of the chest with contrast in 1 year. -Mammogram on 05/01/2018 was BI-RADS Category 1.

## 2018-05-02 NOTE — Progress Notes (Signed)
[place], [place] [address]   CLINIC:  Medical Oncology/Hematology  PCP:  [facility], [address] [place] [address] [phone]   REASON FOR VISIT:  Follow-up for adenocarcinoma of the left lung  CURRENT THERAPY: surveillance per NCCN guidelines   BRIEF ONCOLOGIC HISTORY:    Adenocarcinoma of lung ([place])   03/23/2005 Initial Diagnosis    Adenocarcinoma of lung with left upper lobe wedge resection by Dr. Arlyce Dice measuring 1.4 cm.  0/4 lymph nodes.      01/27/2015 Imaging    CT chest- 1. Stable examination demonstrating multiple tiny 2-3 mm pulmonary nodules in the right upper lobe which are likely benign areas of mucoid impaction in terminal bronchioles, in addition to an unchanged 6 x 5 mm ground-glass attenuation nodule in the apex of the right upper lobe. Continued attention on annual followup examinations is recommended to ensure continued stability. 2. Status post left upper lobectomy. No findings to suggest local recurrence of disease. 3. Atherosclerosis, including three-vessel coronary artery disease. Please note that although the presence of coronary artery calcium documents the presence of coronary artery disease, the severity of this disease and any potential stenosis cannot be assessed on this non-gated CT examination. Assessment for potential risk factor modification, dietary therapy or pharmacologic therapy may be warranted, if clinically indicated.      04/19/2017 Imaging    CT chest- 1. No evidence lung cancer recurrence in the LEFT lower lung. 2. Increase in the ground-glass surrounding a RIGHT upper lobe pulmonary nodule. Minimal size increased over 3 year interval. Recommend follow-up CT in 12 months for part solid nodule. This recommendation follows the consensus statement: Guidelines for Management of Incidental Pulmonary Nodules Detected on CT Images: From the Fleischner Society 2017;  Radiology 2017; 284:228-243. These results will be called to the ordering clinician or representative by the Radiologist Assistant, and communication documented in the PACS or zVision Dashboard.        CANCER STAGING: Cancer Staging Adenocarcinoma of lung ([place]) Staging form: Lung, AJCC 7th Edition - Clinical: Stage IA (T1a, N0, M0) - Signed by Baird Cancer, PA-C on 02/06/2014    INTERVAL HISTORY:  Marissa [name] 52 y.o. female returns for routine follow-up for adenocarcinoma of the left lung. Patient wakes up in the morning with a cough. It gets better throughout the day. She has intermittant diarrhea and constipation. Patient continues to smoke a half pack a day. She has tried several types of ways to stop smoking. Patient denies any new pain. Denies any nausea, vomiting, or diarrhea. Denies any hemoptysis.    REVIEW OF SYSTEMS:  Review of Systems  Constitutional: Negative.   HENT:  Negative.   Eyes: Negative.   Respiratory: Positive for cough.   Cardiovascular: Negative.   Gastrointestinal: Positive for constipation and diarrhea.  Endocrine: Negative.   Genitourinary: Negative.    Skin: Negative.   Neurological: Negative.   Hematological: Negative.   Psychiatric/Behavioral: Negative.      PAST MEDICAL/SURGICAL HISTORY:  Past Medical History:  Diagnosis Date  . Adenocarcinoma of lung ([place]) 02/06/2014  . Anxiety   . Arthritis   . COPD (chronic obstructive pulmonary disease) ([place] [facility])   . Diabetes mellitus   . GERD (gastroesophageal reflux disease)   . Hypertension   . Lung cancer [facility])    Past Surgical History:  Procedure Laterality Date  . APPENDECTOMY    . CHOLECYSTECTOMY    . LOBECTOMY Left 2006  . [facility] IMG  [place]  05/2008   lexiscan -perfusion defect in anterior myocardium (breast attenuation); remaining myocardium with NO evidence of ischemia or infarct; EF 78%; low risk scan  . OOPHORECTOMY Left   . PARTIAL HYSTERECTOMY  1985  . SALPINGOOPHORECTOMY Left     benign 9lb mass removed and a massive wall of smaller tumors removed  . TRANSTHORACIC ECHOCARDIOGRAM  05/2008   EF =/>55%, mild MR; trace TR     SOCIAL HISTORY:  Social History   Socioeconomic History  . Marital status: Married    Spouse name: Not on file  . Number of children: Not on file  . Years of education: Not on file  . Highest education level: Not on file  Occupational History  . Not on file  Social Needs  . Financial resource strain: Not on file  . Food insecurity:    Worry: Not on file    Inability: Not on file  . Transportation needs:    Medical: Not on file    Non-medical: Not on file  Tobacco Use  . Smoking status: Current Every Day Smoker    Packs/day: 2.00    Years: 32.00    Pack years: 64.00    Types: Cigarettes  . Smokeless tobacco: Never Used  Substance and Sexual Activity  . Alcohol use: No  . Drug use: No  . Sexual activity: Not on file  Lifestyle  . Physical activity:    Days per week: Not on file    Minutes per session: Not on file  . Stress: Not on file  Relationships  . Social connections:    Talks on phone: Not on file    Gets together: Not on file    Attends religious service: Not on file    Active member of club or organization: Not on file    Attends meetings of clubs or organizations: Not on file    Relationship status: Not on file  . Intimate partner violence:    Fear of current or ex partner: Not on file    Emotionally abused: Not on file    Physically abused: Not on file    Forced sexual activity: Not on file  Other Topics Concern  . Not on file  Social History Narrative  . Not on file    FAMILY HISTORY:  Family History  Problem Relation Age of Onset  . Colon cancer Mother   . Lung cancer Mother   . Heart disease Mother   . Ovarian cancer Sister   . Heart disease Father   . Hepatitis Brother   . Hernia Brother     CURRENT MEDICATIONS:  Outpatient Encounter Medications as of 05/02/2018  Medication Sig Note  .  albuterol-ipratropium (COMBIVENT) 18-103 MCG/ACT inhaler Inhale 2 puffs into the lungs 4 (four) times daily. Pt does not take qid, she takes prn shortness of breath    . alprazolam (XANAX) 2 MG tablet Take 2 mg by mouth at bedtime as needed. For sleep. Patient may take an additional dose during day if needed for anxiety.    . Biotin 10 MG TABS Take 30 mg by mouth 2 (two) times daily.    . ENALAPRIL MALEATE PO Take 25 mg by mouth daily.     Marland Kitchen esomeprazole (NEXIUM) 40 MG capsule Take 40 mg by mouth daily before breakfast.     . ezetimibe (ZETIA) 10 MG tablet Take 10 mg by mouth daily.     Marland Kitchen FARXIGA 10 MG TABS tablet 10 mg.   Marland Kitchen  fish oil-omega-3 fatty acids 1000 MG capsule Take 2 g by mouth daily. 4 tablets daily    . fluticasone (FLONASE) 50 MCG/ACT nasal spray Place 1 spray into both nostrils as needed. 02/07/2015: Received from: External Pharmacy Received Sig:   . glipiZIDE (GLUCOTROL) 5 MG tablet Take 5 mg by mouth daily. 02/07/2015: Received from: External Pharmacy Received Sig:   . INVOKANA 300 MG TABS tablet    . oxyCODONE (ROXICODONE) 15 MG immediate release tablet Take 15 mg by mouth every 4 (four) hours as needed. For pain. Patient does not take every 4 hours.    Marland Kitchen SILENOR 6 MG TABS 6 mg.   . vitamin E 100 UNIT capsule Take 100 Units by mouth daily.     Marland Kitchen EPIPEN 2-PAK 0.3 MG/0.3ML SOAJ injection Has available 02/07/2015: Received from: External Pharmacy  . [DISCONTINUED] Canagliflozin (INVOKANA) 100 MG TABS Take 1 tablet by mouth daily.   . [DISCONTINUED] promethazine (PHENERGAN) 25 MG tablet Take 25 mg by mouth every 6 (six) hours as needed. For nausea.    . [DISCONTINUED] ramelteon (ROZEREM) 8 MG tablet Take 8 mg by mouth at bedtime.      No facility-administered encounter medications on file as of 05/02/2018.     ALLERGIES:  Allergies  Allergen Reactions  . Penicillins Swelling and Rash     PHYSICAL EXAM:  ECOG Performance status: 0  Vitals:   05/02/18 1426  BP: 129/81  Pulse:  96  Resp: 18  Temp: 98.2 F (36.8 C)  SpO2: 97%   Filed Weights   05/02/18 1426  Weight: 166 lb 9.6 oz (75.6 kg)    Physical Exam Chest: Bilateral clear to auscultation. CVS: S1-S2 regular rate and rhythm. Abdomen: No palpable organomegaly. Extremities: No edema or cyanosis.  LABORATORY DATA:  I have reviewed the labs as listed.  CBC    Component Value Date/Time   WBC 10.7 (H) 09/10/2008 0653   RBC 4.75 09/10/2008 0653   HGB 14.3 09/10/2008 0653   HCT 42.2 09/10/2008 0653   PLT 387 09/10/2008 0653   MCV 88.8 09/10/2008 0653   MCHC 33.9 09/10/2008 0653   RDW 12.7 09/10/2008 0653   LYMPHSABS 2.9 09/10/2008 0653   MONOABS 0.8 09/10/2008 0653   EOSABS 0.2 09/10/2008 0653   BASOSABS 0.0 09/10/2008 0653   CMP Latest Ref Rng & Units 09/10/2008  Glucose 70 - 99 mg/dL 329(H)  BUN 6 - 23 mg/dL 4(L)  Creatinine 0.4 - 1.2 mg/dL 0.63  Sodium 135 - 145 mEq/L 136  Potassium 3.5 - 5.1 mEq/L 3.8  Chloride 96 - 112 mEq/L 105  CO2 19 - 32 mEq/L 24  Calcium 8.4 - 10.5 mg/dL 8.6  Total Protein 6.0 - 8.3 g/dL 6.2  Total Bilirubin 0.3 - 1.2 mg/dL 0.3  Alkaline Phos 39 - 117 U/L 116  AST 0 - 37 U/L 18  ALT 0 - 35 U/L 20       DIAGNOSTIC IMAGING:  I have reviewed images of CT scan dated 04/20/2018 and discussed with the patient in detail.     ASSESSMENT & PLAN:   Adenocarcinoma of lung ([facility]) 1.  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  Reportedly tried Chantix which caused behavioral changes.  Also tried Wellbutrin which made her nauseous.  Reports that she cannot chew Nicorette gum because of her dentures.  We have recommended trying nicotine lozenges. -  Discussed the results of the CT scan of the chest without contrast dated 04/20/2018 which did not show any evidence of recurrence.  There is slight interval increase in size of the groundglass  opacity in the right upper lobe sub-solid nodule. -We have recommended repeating CT scan of the chest with contrast in 1 year. -Mammogram on 05/01/2018 was BI-RADS Category 1.      Orders placed this encounter:  Orders Placed This Encounter  Procedures  . CT Chest W Contrast  . CBC with Differential/Platelet  . Comprehensive metabolic panel      Derek Jack, MD [place] [phone]

## 2018-06-13 DIAGNOSIS — E663 Overweight: Secondary | ICD-10-CM | POA: Diagnosis not present

## 2018-06-13 DIAGNOSIS — E1140 Type 2 diabetes mellitus with diabetic neuropathy, unspecified: Secondary | ICD-10-CM | POA: Diagnosis not present

## 2018-06-13 DIAGNOSIS — I10 Essential (primary) hypertension: Secondary | ICD-10-CM | POA: Diagnosis not present

## 2018-06-13 DIAGNOSIS — Z23 Encounter for immunization: Secondary | ICD-10-CM | POA: Diagnosis not present

## 2018-06-13 DIAGNOSIS — K219 Gastro-esophageal reflux disease without esophagitis: Secondary | ICD-10-CM | POA: Diagnosis not present

## 2018-06-13 DIAGNOSIS — Z6827 Body mass index (BMI) 27.0-27.9, adult: Secondary | ICD-10-CM | POA: Diagnosis not present

## 2018-06-22 DIAGNOSIS — Z1211 Encounter for screening for malignant neoplasm of colon: Secondary | ICD-10-CM | POA: Diagnosis not present

## 2018-07-12 DIAGNOSIS — R69 Illness, unspecified: Secondary | ICD-10-CM | POA: Diagnosis not present

## 2018-09-04 DIAGNOSIS — J45909 Unspecified asthma, uncomplicated: Secondary | ICD-10-CM | POA: Diagnosis not present

## 2018-09-04 DIAGNOSIS — G894 Chronic pain syndrome: Secondary | ICD-10-CM | POA: Diagnosis not present

## 2018-09-04 DIAGNOSIS — K219 Gastro-esophageal reflux disease without esophagitis: Secondary | ICD-10-CM | POA: Diagnosis not present

## 2018-09-04 DIAGNOSIS — Z0001 Encounter for general adult medical examination with abnormal findings: Secondary | ICD-10-CM | POA: Diagnosis not present

## 2018-09-04 DIAGNOSIS — G43909 Migraine, unspecified, not intractable, without status migrainosus: Secondary | ICD-10-CM | POA: Diagnosis not present

## 2018-09-04 DIAGNOSIS — I10 Essential (primary) hypertension: Secondary | ICD-10-CM | POA: Diagnosis not present

## 2018-09-04 DIAGNOSIS — E782 Mixed hyperlipidemia: Secondary | ICD-10-CM | POA: Diagnosis not present

## 2018-09-04 DIAGNOSIS — E1165 Type 2 diabetes mellitus with hyperglycemia: Secondary | ICD-10-CM | POA: Diagnosis not present

## 2018-09-04 DIAGNOSIS — Z6827 Body mass index (BMI) 27.0-27.9, adult: Secondary | ICD-10-CM | POA: Diagnosis not present

## 2018-09-04 DIAGNOSIS — E663 Overweight: Secondary | ICD-10-CM | POA: Diagnosis not present

## 2018-09-05 DIAGNOSIS — Z1389 Encounter for screening for other disorder: Secondary | ICD-10-CM | POA: Diagnosis not present

## 2018-09-05 DIAGNOSIS — E782 Mixed hyperlipidemia: Secondary | ICD-10-CM | POA: Diagnosis not present

## 2018-09-05 DIAGNOSIS — E663 Overweight: Secondary | ICD-10-CM | POA: Diagnosis not present

## 2018-09-05 DIAGNOSIS — Z6827 Body mass index (BMI) 27.0-27.9, adult: Secondary | ICD-10-CM | POA: Diagnosis not present

## 2018-09-05 DIAGNOSIS — Z0001 Encounter for general adult medical examination with abnormal findings: Secondary | ICD-10-CM | POA: Diagnosis not present

## 2019-02-20 DIAGNOSIS — I10 Essential (primary) hypertension: Secondary | ICD-10-CM | POA: Diagnosis not present

## 2019-02-20 DIAGNOSIS — Z1389 Encounter for screening for other disorder: Secondary | ICD-10-CM | POA: Diagnosis not present

## 2019-02-20 DIAGNOSIS — C3490 Malignant neoplasm of unspecified part of unspecified bronchus or lung: Secondary | ICD-10-CM | POA: Diagnosis not present

## 2019-02-20 DIAGNOSIS — Z719 Counseling, unspecified: Secondary | ICD-10-CM | POA: Diagnosis not present

## 2019-02-20 DIAGNOSIS — E663 Overweight: Secondary | ICD-10-CM | POA: Diagnosis not present

## 2019-02-20 DIAGNOSIS — Z6828 Body mass index (BMI) 28.0-28.9, adult: Secondary | ICD-10-CM | POA: Diagnosis not present

## 2019-02-20 DIAGNOSIS — T50905A Adverse effect of unspecified drugs, medicaments and biological substances, initial encounter: Secondary | ICD-10-CM | POA: Diagnosis not present

## 2019-02-20 DIAGNOSIS — R201 Hypoesthesia of skin: Secondary | ICD-10-CM | POA: Diagnosis not present

## 2019-02-20 DIAGNOSIS — E1142 Type 2 diabetes mellitus with diabetic polyneuropathy: Secondary | ICD-10-CM | POA: Diagnosis not present

## 2019-02-20 DIAGNOSIS — E1140 Type 2 diabetes mellitus with diabetic neuropathy, unspecified: Secondary | ICD-10-CM | POA: Diagnosis not present

## 2019-04-17 ENCOUNTER — Emergency Department (HOSPITAL_COMMUNITY): Payer: Medicare HMO

## 2019-04-17 ENCOUNTER — Inpatient Hospital Stay (HOSPITAL_COMMUNITY)
Admission: EM | Admit: 2019-04-17 | Discharge: 2019-04-19 | DRG: 638 | Disposition: A | Discharge status: Home / Self Care | Payer: Medicare HMO | Attending: Family Medicine | Admitting: Family Medicine

## 2019-04-17 ENCOUNTER — Other Ambulatory Visit: Payer: Self-pay

## 2019-04-17 ENCOUNTER — Encounter (HOSPITAL_COMMUNITY): Payer: Self-pay | Admitting: Emergency Medicine

## 2019-04-17 DIAGNOSIS — Z801 Family history of malignant neoplasm of trachea, bronchus and lung: Secondary | ICD-10-CM

## 2019-04-17 DIAGNOSIS — I700 Atherosclerosis of aorta: Secondary | ICD-10-CM

## 2019-04-17 DIAGNOSIS — D735 Infarction of spleen: Secondary | ICD-10-CM

## 2019-04-17 DIAGNOSIS — R918 Other nonspecific abnormal finding of lung field: Secondary | ICD-10-CM | POA: Diagnosis not present

## 2019-04-17 DIAGNOSIS — E1140 Type 2 diabetes mellitus with diabetic neuropathy, unspecified: Secondary | ICD-10-CM | POA: Diagnosis not present

## 2019-04-17 DIAGNOSIS — Z7901 Long term (current) use of anticoagulants: Secondary | ICD-10-CM

## 2019-04-17 DIAGNOSIS — R112 Nausea with vomiting, unspecified: Secondary | ICD-10-CM

## 2019-04-17 DIAGNOSIS — I10 Essential (primary) hypertension: Secondary | ICD-10-CM | POA: Diagnosis present

## 2019-04-17 DIAGNOSIS — E1169 Type 2 diabetes mellitus with other specified complication: Secondary | ICD-10-CM | POA: Diagnosis present

## 2019-04-17 DIAGNOSIS — Z880 Allergy status to penicillin: Secondary | ICD-10-CM

## 2019-04-17 DIAGNOSIS — Z1159 Encounter for screening for other viral diseases: Secondary | ICD-10-CM

## 2019-04-17 DIAGNOSIS — K5790 Diverticulosis of intestine, part unspecified, without perforation or abscess without bleeding: Secondary | ICD-10-CM | POA: Diagnosis present

## 2019-04-17 DIAGNOSIS — Z800 Family history of malignant neoplasm of digestive organs: Secondary | ICD-10-CM

## 2019-04-17 DIAGNOSIS — R0902 Hypoxemia: Secondary | ICD-10-CM

## 2019-04-17 DIAGNOSIS — E1110 Type 2 diabetes mellitus with ketoacidosis without coma: Principal | ICD-10-CM | POA: Diagnosis present

## 2019-04-17 DIAGNOSIS — Z85118 Personal history of other malignant neoplasm of bronchus and lung: Secondary | ICD-10-CM

## 2019-04-17 DIAGNOSIS — F419 Anxiety disorder, unspecified: Secondary | ICD-10-CM | POA: Diagnosis present

## 2019-04-17 DIAGNOSIS — I252 Old myocardial infarction: Secondary | ICD-10-CM

## 2019-04-17 DIAGNOSIS — Z91030 Bee allergy status: Secondary | ICD-10-CM

## 2019-04-17 DIAGNOSIS — Z9049 Acquired absence of other specified parts of digestive tract: Secondary | ICD-10-CM

## 2019-04-17 DIAGNOSIS — Z8249 Family history of ischemic heart disease and other diseases of the circulatory system: Secondary | ICD-10-CM

## 2019-04-17 DIAGNOSIS — E1151 Type 2 diabetes mellitus with diabetic peripheral angiopathy without gangrene: Secondary | ICD-10-CM | POA: Diagnosis present

## 2019-04-17 DIAGNOSIS — J439 Emphysema, unspecified: Secondary | ICD-10-CM | POA: Diagnosis not present

## 2019-04-17 DIAGNOSIS — R1110 Vomiting, unspecified: Secondary | ICD-10-CM | POA: Diagnosis not present

## 2019-04-17 DIAGNOSIS — N280 Ischemia and infarction of kidney: Secondary | ICD-10-CM

## 2019-04-17 DIAGNOSIS — Z79891 Long term (current) use of opiate analgesic: Secondary | ICD-10-CM

## 2019-04-17 DIAGNOSIS — I739 Peripheral vascular disease, unspecified: Secondary | ICD-10-CM | POA: Diagnosis present

## 2019-04-17 DIAGNOSIS — C3490 Malignant neoplasm of unspecified part of unspecified bronchus or lung: Secondary | ICD-10-CM | POA: Diagnosis present

## 2019-04-17 DIAGNOSIS — Z7984 Long term (current) use of oral hypoglycemic drugs: Secondary | ICD-10-CM

## 2019-04-17 DIAGNOSIS — Z720 Tobacco use: Secondary | ICD-10-CM | POA: Diagnosis present

## 2019-04-17 DIAGNOSIS — K760 Fatty (change of) liver, not elsewhere classified: Secondary | ICD-10-CM | POA: Diagnosis not present

## 2019-04-17 DIAGNOSIS — Z79890 Hormone replacement therapy (postmenopausal): Secondary | ICD-10-CM

## 2019-04-17 DIAGNOSIS — R739 Hyperglycemia, unspecified: Secondary | ICD-10-CM | POA: Diagnosis not present

## 2019-04-17 DIAGNOSIS — K219 Gastro-esophageal reflux disease without esophagitis: Secondary | ICD-10-CM | POA: Diagnosis present

## 2019-04-17 DIAGNOSIS — J449 Chronic obstructive pulmonary disease, unspecified: Secondary | ICD-10-CM | POA: Diagnosis present

## 2019-04-17 DIAGNOSIS — Z7951 Long term (current) use of inhaled steroids: Secondary | ICD-10-CM

## 2019-04-17 DIAGNOSIS — F17210 Nicotine dependence, cigarettes, uncomplicated: Secondary | ICD-10-CM | POA: Diagnosis present

## 2019-04-17 DIAGNOSIS — E7849 Other hyperlipidemia: Secondary | ICD-10-CM | POA: Diagnosis not present

## 2019-04-17 DIAGNOSIS — I7090 Unspecified atherosclerosis: Secondary | ICD-10-CM | POA: Diagnosis not present

## 2019-04-17 DIAGNOSIS — Z03818 Encounter for observation for suspected exposure to other biological agents ruled out: Secondary | ICD-10-CM | POA: Diagnosis not present

## 2019-04-17 DIAGNOSIS — Z90711 Acquired absence of uterus with remaining cervical stump: Secondary | ICD-10-CM

## 2019-04-17 DIAGNOSIS — Z888 Allergy status to other drugs, medicaments and biological substances status: Secondary | ICD-10-CM

## 2019-04-17 DIAGNOSIS — H0260 Xanthelasma of unspecified eye, unspecified eyelid: Secondary | ICD-10-CM | POA: Diagnosis present

## 2019-04-17 DIAGNOSIS — Z8041 Family history of malignant neoplasm of ovary: Secondary | ICD-10-CM

## 2019-04-17 DIAGNOSIS — H0263 Xanthelasma of right eye, unspecified eyelid: Secondary | ICD-10-CM | POA: Diagnosis present

## 2019-04-17 DIAGNOSIS — R1032 Left lower quadrant pain: Secondary | ICD-10-CM

## 2019-04-17 DIAGNOSIS — Z79899 Other long term (current) drug therapy: Secondary | ICD-10-CM

## 2019-04-17 DIAGNOSIS — E872 Acidosis: Secondary | ICD-10-CM | POA: Diagnosis not present

## 2019-04-17 DIAGNOSIS — R109 Unspecified abdominal pain: Secondary | ICD-10-CM | POA: Diagnosis not present

## 2019-04-17 DIAGNOSIS — E871 Hypo-osmolality and hyponatremia: Secondary | ICD-10-CM | POA: Diagnosis present

## 2019-04-17 DIAGNOSIS — E785 Hyperlipidemia, unspecified: Secondary | ICD-10-CM | POA: Diagnosis present

## 2019-04-17 DIAGNOSIS — I2510 Atherosclerotic heart disease of native coronary artery without angina pectoris: Secondary | ICD-10-CM | POA: Diagnosis present

## 2019-04-17 HISTORY — DX: Atherosclerotic heart disease of native coronary artery without angina pectoris: I25.10

## 2019-04-17 LAB — URINALYSIS, ROUTINE W REFLEX MICROSCOPIC
Bacteria, UA: NONE SEEN
Bilirubin Urine: NEGATIVE
Glucose, UA: >=500 mg/dL — AB
Ketones, ur: 20 mg/dL — AB
Leukocytes,Ua: NEGATIVE
Nitrite: NEGATIVE
Protein, ur: 30 mg/dL — AB
Specific Gravity, Urine: >1.046 — ABNORMAL HIGH (ref 1.005–1.030)
pH: 5 (ref 5.0–8.0)

## 2019-04-17 LAB — CBC
HCT: 51.1 % — ABNORMAL HIGH (ref 36.0–46.0)
Hemoglobin: 16.6 g/dL — ABNORMAL HIGH (ref 12.0–15.0)
MCH: 30.1 pg (ref 26.0–34.0)
MCHC: 32.5 g/dL (ref 30.0–36.0)
MCV: 92.7 fL (ref 80.0–100.0)
Platelets: 317 10*3/uL (ref 150–400)
RBC: 5.51 MIL/uL — ABNORMAL HIGH (ref 3.87–5.11)
RDW: 13.3 % (ref 11.5–15.5)
WBC: 19.2 10*3/uL — ABNORMAL HIGH (ref 4.0–10.5)
nRBC: 0 % (ref 0.0–0.2)

## 2019-04-17 LAB — BASIC METABOLIC PANEL
Anion gap: 13 (ref 5–15)
BUN: 16 mg/dL (ref 6–20)
CO2: 18 mmol/L — ABNORMAL LOW (ref 22–32)
Calcium: 8.9 mg/dL (ref 8.9–10.3)
Chloride: 100 mmol/L (ref 98–111)
Creatinine, Ser: 0.57 mg/dL (ref 0.44–1.00)
GFR calc Af Amer: >60 mL/min (ref >60)
GFR calc non Af Amer: >60 mL/min (ref >60)
Glucose, Bld: 344 mg/dL — ABNORMAL HIGH (ref 70–99)
Potassium: 3.8 mmol/L (ref 3.5–5.1)
Sodium: 131 mmol/L — ABNORMAL LOW (ref 135–145)

## 2019-04-17 LAB — COMPREHENSIVE METABOLIC PANEL
ALT: 36 U/L (ref 0–44)
AST: 24 U/L (ref 15–41)
Albumin: 4.7 g/dL (ref 3.5–5.0)
Alkaline Phosphatase: 125 U/L (ref 38–126)
Anion gap: 20 — ABNORMAL HIGH (ref 5–15)
BUN: 19 mg/dL (ref 6–20)
CO2: 18 mmol/L — ABNORMAL LOW (ref 22–32)
Calcium: 9.7 mg/dL (ref 8.9–10.3)
Chloride: 95 mmol/L — ABNORMAL LOW (ref 98–111)
Creatinine, Ser: 0.8 mg/dL (ref 0.44–1.00)
GFR calc Af Amer: >60 mL/min (ref >60)
GFR calc non Af Amer: >60 mL/min (ref >60)
Glucose, Bld: 391 mg/dL — ABNORMAL HIGH (ref 70–99)
Potassium: 4.8 mmol/L (ref 3.5–5.1)
Sodium: 133 mmol/L — ABNORMAL LOW (ref 135–145)
Total Bilirubin: 1.1 mg/dL (ref 0.3–1.2)
Total Protein: 8.4 g/dL — ABNORMAL HIGH (ref 6.5–8.1)

## 2019-04-17 LAB — GLUCOSE, CAPILLARY: Glucose-Capillary: 338 mg/dL — ABNORMAL HIGH (ref 70–99)

## 2019-04-17 LAB — CBG MONITORING, ED
Glucose-Capillary: 347 mg/dL — ABNORMAL HIGH (ref 70–99)
Glucose-Capillary: 297 mg/dL — ABNORMAL HIGH (ref 70–99)
Glucose-Capillary: 241 mg/dL — ABNORMAL HIGH (ref 70–99)
Glucose-Capillary: 197 mg/dL — ABNORMAL HIGH (ref 70–99)

## 2019-04-17 LAB — BLOOD GAS, VENOUS
Acid-base deficit: 3.6 mmol/L — ABNORMAL HIGH (ref 0.0–2.0)
Bicarbonate: 20.1 mmol/L (ref 20.0–28.0)
FIO2: 21
O2 Saturation: 52.2 %
Patient temperature: 36.9
pCO2, Ven: 40.9 mmHg — ABNORMAL LOW (ref 44.0–60.0)
pH, Ven: 7.336 (ref 7.250–7.430)
pO2, Ven: <31 mmHg — CL (ref 32.0–45.0)

## 2019-04-17 LAB — CARBOXYHEMOGLOBIN - COOX: Carboxyhemoglobin: 1.6 % — ABNORMAL HIGH (ref 0.5–1.5)

## 2019-04-17 LAB — LIPID PANEL
Cholesterol: 228 mg/dL — ABNORMAL HIGH (ref 0–200)
HDL: 24 mg/dL — ABNORMAL LOW (ref >40)
LDL Cholesterol: 152 mg/dL — ABNORMAL HIGH (ref 0–99)
Total CHOL/HDL Ratio: 9.5 RATIO
Triglycerides: 258 mg/dL — ABNORMAL HIGH (ref <150)
VLDL: 52 mg/dL — ABNORMAL HIGH (ref 0–40)

## 2019-04-17 LAB — LIPASE, BLOOD: Lipase: 32 U/L (ref 11–51)

## 2019-04-17 LAB — METHEMOGLOBIN: Methemoglobin: 0.4 % (ref 0.0–1.5)

## 2019-04-17 LAB — MAGNESIUM: Magnesium: 2.2 mg/dL (ref 1.7–2.4)

## 2019-04-17 MED ORDER — ONDANSETRON HCL 4 MG/2ML IJ SOLN
4.0000 mg | Freq: Once | INTRAMUSCULAR | Status: AC
  Administered 2019-04-17: 4 mg via INTRAVENOUS
  Filled 2019-04-17: qty 2

## 2019-04-17 MED ORDER — SENNA 8.6 MG PO TABS
1.0000 | ORAL_TABLET | Freq: Two times a day (BID) | ORAL | Status: DC
  Administered 2019-04-18 – 2019-04-19 (×3): 8.6 mg via ORAL
  Filled 2019-04-17 (×3): qty 1

## 2019-04-17 MED ORDER — ONDANSETRON HCL 4 MG PO TABS
4.0000 mg | ORAL_TABLET | Freq: Four times a day (QID) | ORAL | Status: DC | PRN
  Administered 2019-04-18: 4 mg via ORAL
  Filled 2019-04-17: qty 1

## 2019-04-17 MED ORDER — ACETAMINOPHEN 325 MG PO TABS: 650.0000 mg | ORAL_TABLET | Freq: Four times a day (QID) | ORAL | Status: DC | PRN

## 2019-04-17 MED ORDER — DEXTROSE-NACL 5-0.45 % IV SOLN
INTRAVENOUS | Status: DC
  Administered 2019-04-17: 19:00:00 via INTRAVENOUS

## 2019-04-17 MED ORDER — INSULIN REGULAR(HUMAN) IN NACL 100-0.9 UT/100ML-% IV SOLN
INTRAVENOUS | Status: DC
  Administered 2019-04-17: 2.9 [IU]/h via INTRAVENOUS
  Filled 2019-04-17: qty 100

## 2019-04-17 MED ORDER — MORPHINE SULFATE (PF) 4 MG/ML IV SOLN
4.0000 mg | Freq: Once | INTRAVENOUS | Status: AC
  Administered 2019-04-17: 4 mg via INTRAVENOUS
  Filled 2019-04-17: qty 1

## 2019-04-17 MED ORDER — POTASSIUM CHLORIDE 10 MEQ/100ML IV SOLN
10.0000 meq | INTRAVENOUS | Status: AC
  Administered 2019-04-17 (×2): 10 meq via INTRAVENOUS
  Filled 2019-04-17 (×2): qty 100

## 2019-04-17 MED ORDER — SODIUM CHLORIDE 0.9 % IV BOLUS
1000.0000 mL | Freq: Once | INTRAVENOUS | Status: AC
  Administered 2019-04-17: 1000 mL via INTRAVENOUS

## 2019-04-17 MED ORDER — SODIUM CHLORIDE 0.9 % IV SOLN
INTRAVENOUS | Status: AC
  Administered 2019-04-17: 100 mL/h via INTRAVENOUS

## 2019-04-17 MED ORDER — POLYETHYLENE GLYCOL 3350 17 G PO PACK: 17.0000 g | PACK | Freq: Every day | ORAL | Status: DC | PRN

## 2019-04-17 MED ORDER — INSULIN ASPART 100 UNIT/ML ~~LOC~~ SOLN: 0.0000 [IU] | Freq: Three times a day (TID) | SUBCUTANEOUS | Status: DC

## 2019-04-17 MED ORDER — HEPARIN (PORCINE) 25000 UT/250ML-% IV SOLN
1300.0000 [IU]/h | INTRAVENOUS | Status: DC
  Administered 2019-04-17: 1150 [IU]/h via INTRAVENOUS
  Administered 2019-04-19: 1300 [IU]/h via INTRAVENOUS
  Filled 2019-04-17 (×3): qty 250

## 2019-04-17 MED ORDER — ATORVASTATIN CALCIUM 40 MG PO TABS
80.0000 mg | ORAL_TABLET | Freq: Every day | ORAL | Status: DC
  Administered 2019-04-18: 80 mg via ORAL
  Filled 2019-04-17: qty 2

## 2019-04-17 MED ORDER — ONDANSETRON HCL 4 MG/2ML IJ SOLN: 4.0000 mg | Freq: Four times a day (QID) | INTRAMUSCULAR | Status: DC | PRN

## 2019-04-17 MED ORDER — HEPARIN BOLUS VIA INFUSION
4000.0000 [IU] | Freq: Once | INTRAVENOUS | Status: AC
  Administered 2019-04-17: 4000 [IU] via INTRAVENOUS

## 2019-04-17 MED ORDER — INSULIN ASPART 100 UNIT/ML ~~LOC~~ SOLN
0.0000 [IU] | Freq: Every day | SUBCUTANEOUS | Status: DC
  Administered 2019-04-17: 4 [IU] via SUBCUTANEOUS

## 2019-04-17 MED ORDER — IOHEXOL 300 MG/ML  SOLN
100.0000 mL | Freq: Once | INTRAMUSCULAR | Status: AC | PRN
  Administered 2019-04-17: 100 mL via INTRAVENOUS

## 2019-04-17 MED ORDER — ASPIRIN EC 81 MG PO TBEC
81.0000 mg | DELAYED_RELEASE_TABLET | Freq: Every day | ORAL | Status: DC
  Administered 2019-04-18 – 2019-04-19 (×2): 81 mg via ORAL
  Filled 2019-04-17 (×2): qty 1

## 2019-04-17 MED ORDER — ACETAMINOPHEN 650 MG RE SUPP: 650.0000 mg | Freq: Four times a day (QID) | RECTAL | Status: DC | PRN

## 2019-04-17 NOTE — ED Notes (Signed)
Attempt to give report x2

## 2019-04-17 NOTE — Progress Notes (Signed)
ANTICOAGULATION CONSULT NOTE - Initial Consult  Pharmacy Consult for heparin gtt  Indication: thoracic plaque rupture  Allergies  Allergen Reactions  . Penicillins Swelling and Rash    Patient Measurements: Height: 5\' 5"  (165.1 cm) Weight: 170 lb (77.1 kg) IBW/kg (Calculated) : 57 Heparin Dosing Weight: HEPARIN DW (KG): 73   Vital Signs: Temp: 97.9 F (36.6 C) (07/07 1535) Temp Source: Oral (07/07 1535) BP: 135/88 (07/07 1817) Pulse Rate: 91 (07/07 1817)  Labs: Recent Labs    04/17/19 1341  HGB 16.6*  HCT 51.1*  PLT 317  CREATININE 0.80    Estimated Creatinine Clearance: 82.5 mL/min (by C-G formula based on SCr of 0.8 mg/dL).   Medical History: Past Medical History:  Diagnosis Date  . Adenocarcinoma of lung ([place]) 02/06/2014  . Anxiety   . Arthritis   . COPD (chronic obstructive pulmonary disease) ([place])   . Diabetes mellitus   . GERD (gastroesophageal reflux disease)   . Hypertension   . Lung cancer ([place])     Medications:  (Not in a hospital admission)  Scheduled:  . heparin  4,000 Units Intravenous Once   Infusions:  . dextrose 5 % and 0.45% NaCl 50 mL/hr at 04/17/19 1839  . heparin    . insulin 5.4 Units/hr (04/17/19 1824)   PRN:  Anti-infectives (From admission, onward)   None      Assessment: Marissa [name] a 53 y.o. female requires anticoagulation with a heparin iv infusion for the indication of  thoracic plaque rupture. Heparin gtt will be started following pharmacy protocol per pharmacy consult. Patient is not on previous oral anticoagulant that will require aPTT/HL correlation before transitioning to only HL monitoring.   Goal of Therapy:  Heparin level 0.3-0.7 units/ml Monitor platelets by anticoagulation protocol: Yes   Plan:  Give 4000 units bolus x 1 Start heparin infusion at 1150 units/hr Check anti-Xa level in 6 hours and daily while on heparin Continue to monitor H&H and platelets  Heparin level to be drawn in 6 hours.   Marissa [name] Marissa [name] 04/17/2019,7:26 PM

## 2019-04-17 NOTE — ED Notes (Signed)
Pt taken to ct 

## 2019-04-17 NOTE — ED Notes (Signed)
Ns at 198ml/hr hung with second iv potassium bag

## 2019-04-17 NOTE — ED Notes (Signed)
CRITICAL VALUE ALERT  Critical Value:  PO2 <31  Date & Time Notied:  04/17/2019, 4388  Provider Notified: Claiborne Rigg, PA  Orders Received/Actions taken: no new orders

## 2019-04-17 NOTE — ED Notes (Signed)
Phleb in room

## 2019-04-17 NOTE — ED Notes (Signed)
Pt in ct 

## 2019-04-17 NOTE — ED Triage Notes (Signed)
Pain to LLQ and vomiting for past couple of days

## 2019-04-17 NOTE — ED Provider Notes (Signed)
[facility] EMERGENCY DEPARTMENT Provider Note   CSN: [id number] Arrival date & time: 04/17/19  1131    History   Chief Complaint Chief Complaint  Patient presents with   Abdominal Pain    HPI Marissa [name] is a 53 y.o. female presenting for evaluation nausea, vomiting, abdominal pain.  Patient states since yesterday, she has been having persistent vomiting.  She reports persistent abdominal discomfort, states pain is manageable but constant.  She has been unable to tolerate any p.o. in the past 2 days.  She denies blood in her emesis.  Patient is also reporting left lower quadrant abdominal pain, worse with having a bowel movement.  She denies blood in her stool.  Patient states she has had a colonoscopy in the past which showed diverticulosis.  She has never had an episode of diverticulitis.  She denies fevers, chills, chest pain, shortness of breath.  Patient states cough is slightly worse than her baseline.  She denies urinary symptoms, she has chronic diarrhea which is unchanged from baseline.  Additional history obtained per chart review, patient with a history of COPD, diabetes, GERD, hypertension, lung cancer.     HPI  Past Medical History:  Diagnosis Date   Adenocarcinoma of lung ([place]) 02/06/2014   Anxiety    Arthritis    COPD (chronic obstructive pulmonary disease) (HCC)    Diabetes mellitus    GERD (gastroesophageal reflux disease)    Hypertension    Lung cancer [facility])     Patient Active Problem List   Diagnosis Date Noted   Hyperglycemia 04/17/2019   Adenocarcinoma of lung ([place]) 02/06/2014    Past Surgical History:  Procedure Laterality Date   APPENDECTOMY     CHOLECYSTECTOMY     LOBECTOMY Left 2006   [place] [facility]  05/2008   lexiscan -perfusion defect in anterior myocardium (breast attenuation); remaining myocardium with NO evidence of ischemia or infarct; EF 78%; low risk scan   OOPHORECTOMY Left    PARTIAL HYSTERECTOMY  1985    SALPINGOOPHORECTOMY Left    benign 9lb mass removed and a massive wall of smaller tumors removed   TRANSTHORACIC ECHOCARDIOGRAM  05/2008   EF =/>55%, mild MR; trace TR     OB History   No obstetric history on file.      Home Medications    Prior to Admission medications   Medication Sig Start Date End Date Taking? Authorizing Provider  albuterol-ipratropium (COMBIVENT) 18-103 MCG/ACT inhaler Inhale 2 puffs into the lungs 4 (four) times daily. Pt does not take qid, she takes prn shortness of breath     [provider]  ALPRAZolam (XANAX) 0.5 MG tablet Take 1 tablet by mouth 3 (three) times daily as needed. 02/20/19   [provider]  alprazolam Duanne Moron) 2 MG tablet Take 2 mg by mouth at bedtime as needed. For sleep. Patient may take an additional dose during day if needed for anxiety.     [provider]  Biotin 10 MG TABS Take 30 mg by mouth 2 (two) times daily.     [provider]  busPIRone (BUSPAR) 30 MG tablet Take 1 tablet by mouth 2 (two) times a day. 02/20/19   [provider]  ENALAPRIL MALEATE PO Take 25 mg by mouth daily.      [provider]  EPIPEN 2-PAK 0.3 MG/0.3ML SOAJ injection Has available 01/22/15   [provider]  esomeprazole (NEXIUM) 40 MG capsule Take 40 mg by mouth daily before breakfast.  [provider]  ezetimibe (ZETIA) 10 MG tablet Take 10 mg by mouth daily.      [provider]  FARXIGA 10 MG TABS tablet 10 mg. 03/03/18   [provider]  fish oil-omega-3 fatty acids 1000 MG capsule Take 2 g by mouth daily. 4 tablets daily     [provider]  fluticasone (FLONASE) 50 MCG/ACT nasal spray Place 1 spray into both nostrils as needed. 01/22/15   [provider]  glimepiride (AMARYL) 4 MG tablet Take 1 tablet by mouth daily. 02/20/19   [provider]  glipiZIDE (GLUCOTROL) 5 MG tablet Take 5 mg by mouth daily. 11/08/14   [provider]    INVOKANA 300 MG TABS tablet  01/31/17   [provider]  levothyroxine (SYNTHROID) 50 MCG tablet Take 1 tablet by mouth daily. 02/20/19   [provider]  lisinopril (ZESTRIL) 5 MG tablet Take 1 tablet by mouth daily. 02/20/19   [provider]  metFORMIN (GLUCOPHAGE) 500 MG tablet Take 1 tablet by mouth 2 (two) times a day. 02/27/19   [provider]  oxyCODONE (ROXICODONE) 15 MG immediate release tablet Take 15 mg by mouth every 4 (four) hours as needed. For pain. Patient does not take every 4 hours.     [provider]  SILENOR 6 MG TABS 6 mg. 03/07/18   [provider]  vitamin E 100 UNIT capsule Take 100 Units by mouth daily.      [provider]    Family History Family History  Problem Relation Age of Onset   Colon cancer Mother    Lung cancer Mother    Heart disease Mother    Heart disease Father    Ovarian cancer Sister    Hepatitis Brother    Hernia Brother     Social History Social History   Tobacco Use   Smoking status: Current Every Day Smoker    Packs/day: 2.00    Years: 32.00    Pack years: 64.00    Types: Cigarettes   Smokeless tobacco: Never Used  Substance Use Topics   Alcohol use: No   Drug use: No     Allergies   Penicillins   Review of Systems Review of Systems  Gastrointestinal: Positive for abdominal pain, nausea and vomiting.  All other systems reviewed and are negative.    Physical Exam Updated Vital Signs BP (!) 149/79    Pulse 99    Temp 97.9 F (36.6 C) (Oral)    Resp 17    Ht 5\' 5"  (1.651 m)    Wt 77.1 kg    SpO2 97%    BMI 28.29 kg/m   Physical Exam Vitals signs and nursing note reviewed.  Constitutional:      General: She is not in acute distress.    Appearance: She is well-developed.     Comments: Appears uncomfortable due to pain/nausea  HENT:     Head: Normocephalic and atraumatic.  Eyes:     Conjunctiva/sclera: Conjunctivae normal.     Pupils:  Pupils are equal, round, and reactive to light.  Neck:     Musculoskeletal: Normal range of motion and neck supple.  Cardiovascular:     Rate and Rhythm: Normal rate and regular rhythm.     Pulses: Normal pulses.  Pulmonary:     Effort: Pulmonary effort is normal. No respiratory distress.     Breath sounds: Normal breath sounds. No wheezing.  Abdominal:  General: There is no distension.     Palpations: Abdomen is soft. There is no mass.     Tenderness: There is abdominal tenderness. There is no guarding or rebound.     Comments: ttp of LLQ abd. Soft without rigidity, guarding, distention.  Negative rebound.  Musculoskeletal: Normal range of motion.  Skin:    General: Skin is warm and dry.     Capillary Refill: Capillary refill takes less than 2 seconds.  Neurological:     Mental Status: She is alert and oriented to person, place, and time.       ED Treatments / Results  Labs (all labs ordered are listed, but only abnormal results are displayed) Labs Reviewed  COMPREHENSIVE METABOLIC PANEL - Abnormal; Notable for the following components:      Result Value   Sodium 133 (*)    Chloride 95 (*)    CO2 18 (*)    Glucose, Bld 391 (*)    Total Protein 8.4 (*)    Anion gap 20 (*)    All other components within normal limits  CBC - Abnormal; Notable for the following components:   WBC 19.2 (*)    RBC 5.51 (*)    Hemoglobin 16.6 (*)    HCT 51.1 (*)    All other components within normal limits  URINALYSIS, ROUTINE W REFLEX MICROSCOPIC - Abnormal; Notable for the following components:   Color, Urine STRAW (*)    Specific Gravity, Urine >1.046 (*)    Glucose, UA >=500 (*)    Hgb urine dipstick LARGE (*)    Ketones, ur 20 (*)    Protein, ur 30 (*)    All other components within normal limits  BLOOD GAS, VENOUS - Abnormal; Notable for the following components:   pCO2, Ven 40.9 (*)    pO2, Ven <31.0 (*)    Acid-base deficit 3.6 (*)    All other components within normal  limits  CBG MONITORING, ED - Abnormal; Notable for the following components:   Glucose-Capillary 347 (*)    All other components within normal limits  CBG MONITORING, ED - Abnormal; Notable for the following components:   Glucose-Capillary 297 (*)    All other components within normal limits  CBG MONITORING, ED - Abnormal; Notable for the following components:   Glucose-Capillary 241 (*)    All other components within normal limits  CBG MONITORING, ED - Abnormal; Notable for the following components:   Glucose-Capillary 197 (*)    All other components within normal limits  NOVEL CORONAVIRUS, NAA (HOSPITAL ORDER, SEND-OUT TO REF LAB)  LIPASE, BLOOD  HEPARIN LEVEL (UNFRACTIONATED)  CBC  LIPID PANEL  BASIC METABOLIC PANEL    EKG None  Radiology Dg Chest 2 View  Result Date: 04/17/2019 CLINICAL DATA:  Left lower quadrant pain, emesis, history of lung adenocarcinoma and left upper lobectomy. EXAM: CHEST - 2 VIEW COMPARISON:  CT chest performed April 20, 2018 FINDINGS: Stable postsurgical changes from left upper lobectomy with unchanged elevation of left hemidiaphragm and surgical clips along the left mediastinal border. No consolidation, features of edema, pneumothorax, or effusion. Cardiomediastinal contours are stable. No acute osseous or soft tissue abnormality. Stable post thoracotomy changes of the left chest wall. Degenerative changes in the shoulders and spine. Cholecystectomy clips in the right upper quadrant. IMPRESSION: No active cardiopulmonary disease. Postsurgical changes from prior thoracotomy and left upper lobectomy. Electronically Signed   By: MD Lovena Le   On: 04/17/2019 16:49   Ct Chest  W Contrast  Result Date: 04/17/2019 CLINICAL DATA:  Left-sided abdominal pain with vomiting and diarrhea for 1 day. History of lung cancer. Wedge resection in 2006. EXAM: CT CHEST, ABDOMEN, AND PELVIS WITH CONTRAST TECHNIQUE: Multidetector CT imaging of the chest, abdomen and pelvis was  performed following the standard protocol during bolus administration of intravenous contrast. CONTRAST:  134mL OMNIPAQUE IOHEXOL 300 MG/ML  SOLN COMPARISON:  Chest CT 04/20/2018 FINDINGS: CT CHEST FINDINGS Cardiovascular: The heart is normal in size. No pericardial effusion. The aorta is normal in caliber. Scattered atherosclerotic calcifications. Moderate three-vessel coronary artery calcifications In the descending thoracic aorta there is a long segment of atherosclerotic disease with what appears to be possible ulcerated plaque and loose debris in the aortic lumen. This is certainly a worrisome finding and it appears the patient has infarct of the lower pole region upper left kidney consistent with embolic phenomenon. There may be a small infarct in the upper aspect of the spleen also. Mediastinum/Nodes: No mediastinal or hilar mass or adenopathy. The esophagus is grossly normal Lungs/Pleura: Advanced emphysematous changes and areas of pulmonary scarring. Surgical changes involving the left lung from a prior wedge resection. There is a 14 mm ground-glass opacity in the right upper lobe on image number 22. This has slowly enlarged over the past several CT scans and is somewhat suspicious for a slow growing adenocarcinoma. Musculoskeletal: No breast masses, supraclavicular or axillary adenopathy. The thyroid gland appears normal. The bony structures are intact. CT ABDOMEN PELVIS FINDINGS Hepatobiliary: Diffuse fatty infiltration of the liver but no focal hepatic lesions or intrahepatic biliary dilatation. The gallbladder is surgically absent. No common bile duct dilatation. Pancreas: No mass, inflammation or ductal dilatation. Spleen: Band of low attenuation in the upper aspect of the spleen worrisome for a small infarct. Adrenals/Urinary Tract: The adrenal glands are normal. Extensive scarring changes involving the right kidney with several cortical defects but no hydronephrosis or mass. Large area of low  attenuation involving the lower pole region of left kidney most consistent with renal infarct. I think pyelonephritis is less likely given the lack of surrounding inflammatory change and the fact that the patient has debris/soft plaque in the upstream aorta. The bladder is unremarkable. Stomach/Bowel: Stomach, duodenum and small bowel are unremarkable. No acute inflammatory changes, mass lesions or obstructive findings. The terminal ileum appears normal. The appendix is surgically absent. Mild fibrofatty infiltrated type changes involving the ascending colon likely due to remote inflammatory bowel disease. Vascular/Lymphatic: Moderate atherosclerotic calcifications involving the aorta and branch vessels and iliac arteries. Reproductive: The uterus and right ovary appear normal. Left ovary is quite small. Other: No free pelvic fluid collections or pelvic mass. No pelvic adenopathy. No inguinal mass or adenopathy. Musculoskeletal: No significant bony findings. IMPRESSION: 1. Long segment (over 6 cm) of probable ulcerated plaque/soft plaque and loose debris in the descending thoracic aorta. No obvious dissection. Patient probably needs anticoagulation and vascular surgery consultation. 2. Splenic and lower pole left renal infarcts likely due to above finding. 3. Surgical changes involving the left lung but no findings for recurrent tumor. 4. Slowly enlarging ground-glass nodule in the right lung apex worrisome for slow growing adenocarcinoma. 5. Advanced emphysematous changes and pulmonary scarring. 6. Age advanced atherosclerotic calcifications involving the thoracic and abdominal aorta and branch vessels including the coronary arteries. 7. Diffuse fatty infiltration of the liver. 8. Remote scarring changes involving both kidneys. 9. Status post cholecystectomy.  No biliary dilatation. Electronically Signed   By: Ricky Stabs.D.  On: 04/17/2019 17:27   Ct Abdomen Pelvis W Contrast  Result Date:  04/17/2019 CLINICAL DATA:  Left-sided abdominal pain with vomiting and diarrhea for 1 day. History of lung cancer. Wedge resection in 2006. EXAM: CT CHEST, ABDOMEN, AND PELVIS WITH CONTRAST TECHNIQUE: Multidetector CT imaging of the chest, abdomen and pelvis was performed following the standard protocol during bolus administration of intravenous contrast. CONTRAST:  135mL OMNIPAQUE IOHEXOL 300 MG/ML  SOLN COMPARISON:  Chest CT 04/20/2018 FINDINGS: CT CHEST FINDINGS Cardiovascular: The heart is normal in size. No pericardial effusion. The aorta is normal in caliber. Scattered atherosclerotic calcifications. Moderate three-vessel coronary artery calcifications In the descending thoracic aorta there is a long segment of atherosclerotic disease with what appears to be possible ulcerated plaque and loose debris in the aortic lumen. This is certainly a worrisome finding and it appears the patient has infarct of the lower pole region upper left kidney consistent with embolic phenomenon. There may be a small infarct in the upper aspect of the spleen also. Mediastinum/Nodes: No mediastinal or hilar mass or adenopathy. The esophagus is grossly normal Lungs/Pleura: Advanced emphysematous changes and areas of pulmonary scarring. Surgical changes involving the left lung from a prior wedge resection. There is a 14 mm ground-glass opacity in the right upper lobe on image number 22. This has slowly enlarged over the past several CT scans and is somewhat suspicious for a slow growing adenocarcinoma. Musculoskeletal: No breast masses, supraclavicular or axillary adenopathy. The thyroid gland appears normal. The bony structures are intact. CT ABDOMEN PELVIS FINDINGS Hepatobiliary: Diffuse fatty infiltration of the liver but no focal hepatic lesions or intrahepatic biliary dilatation. The gallbladder is surgically absent. No common bile duct dilatation. Pancreas: No mass, inflammation or ductal dilatation. Spleen: Band of low  attenuation in the upper aspect of the spleen worrisome for a small infarct. Adrenals/Urinary Tract: The adrenal glands are normal. Extensive scarring changes involving the right kidney with several cortical defects but no hydronephrosis or mass. Large area of low attenuation involving the lower pole region of left kidney most consistent with renal infarct. I think pyelonephritis is less likely given the lack of surrounding inflammatory change and the fact that the patient has debris/soft plaque in the upstream aorta. The bladder is unremarkable. Stomach/Bowel: Stomach, duodenum and small bowel are unremarkable. No acute inflammatory changes, mass lesions or obstructive findings. The terminal ileum appears normal. The appendix is surgically absent. Mild fibrofatty infiltrated type changes involving the ascending colon likely due to remote inflammatory bowel disease. Vascular/Lymphatic: Moderate atherosclerotic calcifications involving the aorta and branch vessels and iliac arteries. Reproductive: The uterus and right ovary appear normal. Left ovary is quite small. Other: No free pelvic fluid collections or pelvic mass. No pelvic adenopathy. No inguinal mass or adenopathy. Musculoskeletal: No significant bony findings. IMPRESSION: 1. Long segment (over 6 cm) of probable ulcerated plaque/soft plaque and loose debris in the descending thoracic aorta. No obvious dissection. Patient probably needs anticoagulation and vascular surgery consultation. 2. Splenic and lower pole left renal infarcts likely due to above finding. 3. Surgical changes involving the left lung but no findings for recurrent tumor. 4. Slowly enlarging ground-glass nodule in the right lung apex worrisome for slow growing adenocarcinoma. 5. Advanced emphysematous changes and pulmonary scarring. 6. Age advanced atherosclerotic calcifications involving the thoracic and abdominal aorta and branch vessels including the coronary arteries. 7. Diffuse fatty  infiltration of the liver. 8. Remote scarring changes involving both kidneys. 9. Status post cholecystectomy.  No biliary dilatation. Electronically Signed  By: Marijo Sanes M.D.   On: 04/17/2019 17:27    Procedures .Critical Care Performed by: Franchot Heidelberg, PA-C Authorized by: Franchot Heidelberg, PA-C   Critical care provider statement:    Critical care time (minutes):  50   Critical care time was exclusive of:  Teaching time and separately billable procedures and treating other patients   Critical care was necessary to treat or prevent imminent or life-threatening deterioration of the following conditions:  Endocrine crisis   Critical care was time spent personally by me on the following activities:  Blood draw for specimens, development of treatment plan with patient or surrogate, discussions with consultants, evaluation of patient's response to treatment, examination of patient, obtaining history from patient or surrogate, ordering and performing treatments and interventions, ordering and review of laboratory studies, ordering and review of radiographic studies, re-evaluation of patient's condition, pulse oximetry and review of old charts   I assumed direction of critical care for this patient from another provider in my specialty: no   Comments:     Pt in dka, insulin gtt started and pt admitted   (including critical care time)  Medications Ordered in ED Medications  insulin aspart (novoLOG) injection 0-5 Units (has no administration in time range)  insulin aspart (novoLOG) injection 0-15 Units (has no administration in time range)  heparin ADULT infusion 100 units/mL (25000 units/270mL sodium chloride 0.45%) (1,150 Units/hr Intravenous New Bag/Given 04/17/19 2044)  sodium chloride 0.9 % bolus 1,000 mL (0 mLs Intravenous Stopped 04/17/19 1708)  ondansetron (ZOFRAN) injection 4 mg (4 mg Intravenous Given 04/17/19 1520)  morphine 4 MG/ML injection 4 mg (4 mg Intravenous Given 04/17/19  1520)  potassium chloride 10 mEq in 100 mL IVPB (0 mEq Intravenous Stopped 04/17/19 1820)  iohexol (OMNIPAQUE) 300 MG/ML solution 100 mL (100 mLs Intravenous Contrast Given 04/17/19 1644)  heparin bolus via infusion 4,000 Units (4,000 Units Intravenous Bolus from Bag 04/17/19 2043)     Initial Impression / Assessment and Plan / ED Course  I have reviewed the triage vital signs and the nursing notes.  Pertinent labs & imaging results that were available during my care of the patient were reviewed by me and considered in my medical decision making (see chart for details).        Patient presenting for evaluation nausea, vomiting, abdominal pain.  Physical exam shows patient appears uncomfortable, but nontoxic.  Tenderness palpation of left lower quadrant abdomen.  Patient with a history of diabetes, BGL elevated today. Labs show white count of 12, gap of 20 and bicarb of 18.  Will start treatment for DKA despite urine pending, especially in the setting of likely diverticulitis. Ct pending. Will order cxr for increased cough. zofran morphine and fluids for sx control.   Informed by RN that patient has scheduled CT chest in 2 days, will add on CT chest to abdomen today.  CT abdomen shows extensive plaque of the descending thoracic aorta with loose debris.  Also shows infarction of left lower kidney and spleen, likely secondary to the plaque.  Recommends consultation with vascular surgery and starting patient on anticoagulation.  Chest x-ray without pneumonia.  Chest CT shows changes of the right lung apex consistent with recurrence of cancer.  Will call for admission for DKA.  Discussed with Dr. Doren Custard from vascular surgery who recommends starting anticoagulation such as Eliquis and having patient follow-up outpatient in the clinic.  His clinic staff will contact the patient to arrange for follow-up.  Discussed with Dr. Cyndia Skeeters,  who agrees to admission.   Final Clinical Impressions(s) / ED Diagnoses    Final diagnoses:  Diabetic ketoacidosis without coma associated with type 2 diabetes mellitus ([place])  Splenic infarction  Renal infarct [facility])  Atherosclerosis of aorta [facility])    ED Discharge Orders    None       Franchot Heidelberg, PA-C 04/17/19 2104    Nat Christen, MD 04/18/19 1656

## 2019-04-17 NOTE — ED Notes (Signed)
Glucose stabilizer stopped per hospitalist. Pt also given meal tray

## 2019-04-17 NOTE — H&P (Signed)
History and Physical    Marissa [name] ZOX:[id number] DOB: [birth year] DOA: 04/17/2019  PCP: Redmond School, MD Patient coming from: Home  Chief Complaint: Vomiting and abdominal pain  HPI: Marissa [name] is a 53 y.o. 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, COPD, tobacco use disorder and anxiety presenting with emesis and abdominal pain.  Patient had emesis and abdominal pain for the last 2 days.  Pain pain is over LLQ and across lower abdomen.  She described the pain as dull. More severe when she lies on the right side. She rated her left flank pain as 7/10 before coming to ED.  Currently her pain has gone.   She denies urinary symptoms.  Had frequent emesis.  She says she had about 4-5 episodes of emesis today.  Emesis was nonbloody.   Last emesis was this morning.   She says she always has loose stool due to metformin.  Patient denies fever, URI symptoms, chest pain, dyspnea, headache, vision change or focal neuro symptoms.  She says she has cough which is at baseline.  She says she is on glipizide and glimepiride at the same time. Reports smoking about 1.5 pack a day.  Denies drinking alcohol recreational drug use.  In ED, afebrile and hemodynamically stable.  Saturating in upper 90s.  WBC 19.2.  On BMP: Sodium 133, CO2 18, anion gap 20, glucose 391.  ABG 7.3 4/41/< 31/20.  EKG normal sinus rhythm.  XR without acute finding.  CTA chest/abdomen/pelvis concerning for long segment (> 6 cm) of probable ulcerated plaque/soft plaque and loose debris's in the descending thoracic cord without evidence of dissection, splenic and lower pole left renal infarcts, slowly enlarging groundglass nodules in right lung apex concerning for slow-growing adenocarcinoma and other chronic findings.  See detailed read below.  Per ED PA, vascular surgery, Dr. Scot Dock consulted and recommended starting anticoagulation and outpatient follow-up.  Patient was started on insulin  drip per DKA protocol.  CBG improved to 197 and hospital service was called for admission.  ROS All review of system negative except for pertinent positives and negatives as history of present illness above. PMH Past Medical History:  Diagnosis Date   Adenocarcinoma of lung ([place]) 02/06/2014   Anxiety    Arthritis    COPD (chronic obstructive pulmonary disease) (HCC)    Diabetes mellitus    GERD (gastroesophageal reflux disease)    Hypertension    Lung cancer ([place])    [place] Past Surgical History:  Procedure Laterality Date   APPENDECTOMY     CHOLECYSTECTOMY     LOBECTOMY Left 2006   [facility] MI  05/2008   lexiscan -perfusion defect in anterior myocardium (breast attenuation); remaining myocardium with NO evidence of ischemia or infarct; EF 78%; low risk scan   OOPHORECTOMY Left    PARTIAL HYSTERECTOMY  1985   SALPINGOOPHORECTOMY Left    benign 9lb mass removed and a massive wall of smaller tumors removed   TRANSTHORACIC ECHOCARDIOGRAM  05/2008   EF =/>55%, mild MR; trace TR   Fam HX Family History  Problem Relation Age of Onset   Colon cancer Mother    Lung cancer Mother    Heart disease Mother    Heart disease Father    Ovarian cancer Sister    Hepatitis Brother    Hernia Brother    Social Hx  reports that she has been smoking cigarettes. She has a 64.00 pack-year smoking history. She has never used  smokeless tobacco. She reports that she does not drink alcohol or use drugs.  Allergy Allergies  Allergen Reactions   Penicillins Swelling and Rash   Home Meds Prior to Admission medications   Medication Sig Start Date End Date Taking? Authorizing Provider  albuterol-ipratropium (COMBIVENT) 18-103 MCG/ACT inhaler Inhale 2 puffs into the lungs 4 (four) times daily. Pt does not take qid, she takes prn shortness of breath     [provider]  ALPRAZolam (XANAX) 0.5 MG tablet Take 1 tablet by mouth 3 (three) times daily as needed. 02/20/19    [provider]  alprazolam Duanne Moron) 2 MG tablet Take 2 mg by mouth at bedtime as needed. For sleep. Patient may take an additional dose during day if needed for anxiety.     [provider]  Biotin 10 MG TABS Take 30 mg by mouth 2 (two) times daily.     [provider]  busPIRone (BUSPAR) 30 MG tablet Take 1 tablet by mouth 2 (two) times a day. 02/20/19   [provider]  ENALAPRIL MALEATE PO Take 25 mg by mouth daily.      [provider]  EPIPEN 2-PAK 0.3 MG/0.3ML SOAJ injection Has available 01/22/15   [provider]  esomeprazole (NEXIUM) 40 MG capsule Take 40 mg by mouth daily before breakfast.      [provider]  ezetimibe (ZETIA) 10 MG tablet Take 10 mg by mouth daily.      [provider]  FARXIGA 10 MG TABS tablet 10 mg. 03/03/18   [provider]  fish oil-omega-3 fatty acids 1000 MG capsule Take 2 g by mouth daily. 4 tablets daily     [provider]  fluticasone (FLONASE) 50 MCG/ACT nasal spray Place 1 spray into both nostrils as needed. 01/22/15   [provider]  glimepiride (AMARYL) 4 MG tablet Take 1 tablet by mouth daily. 02/20/19   [provider]  glipiZIDE (GLUCOTROL) 5 MG tablet Take 5 mg by mouth daily. 11/08/14   [provider]  INVOKANA 300 MG TABS tablet  01/31/17   [provider]  levothyroxine (SYNTHROID) 50 MCG tablet Take 1 tablet by mouth daily. 02/20/19   [provider]  lisinopril (ZESTRIL) 5 MG tablet Take 1 tablet by mouth daily. 02/20/19   [provider]  metFORMIN (GLUCOPHAGE) 500 MG tablet Take 1 tablet by mouth 2 (two) times a day. 02/27/19   [provider]  oxyCODONE (ROXICODONE) 15 MG immediate release tablet Take 15 mg by mouth every 4 (four) hours as needed. For pain. Patient does not take every 4 hours.     [provider]  SILENOR 6 MG TABS 6 mg. 03/07/18   [provider]  vitamin E 100  UNIT capsule Take 100 Units by mouth daily.      [provider]    Physical Exam: Vitals:   04/17/19 1645 04/17/19 1700 04/17/19 1730 04/17/19 1817  BP:  (!) 141/79 137/77 135/88  Pulse: 86 91 93 91  Resp: (!) 24 18 (!) 22 15  Temp:      TempSrc:      SpO2: 99% 98% 97% 97%  Weight:      Height:        Constitutional - resting comfortably, no acute distress.  Sitting on the edge of the bed watching TV. Eyes - vision grossly intact. Sclera anicteric.  Xanthelasma bilaterally Nose - no gross deformity or drainage Mouth - no oral lesions  noted Throat - no swelling or erythema Endo - no obvious thyromegaly CV -RRR. (+)S1S2, no murmurs; no JVD or peripheral edema Resp - No increased work of breathing, good aeration bilaterally, no wheeze or crackles GI - (+)BS, soft, non-tender GU: No suprapubic or CVA tenderness. MSK - normal range of motion, no obvious deformity Skin - no obvious rashes or lesions Neuro - alert, aware, oriented to person/place/time. No gross deficit Psych - calm, normal mood and affect  Personally Reviewed Radiological Exams Dg Chest 2 View  Result Date: 04/17/2019 CLINICAL DATA:  Left lower quadrant pain, emesis, history of lung adenocarcinoma and left upper lobectomy. EXAM: CHEST - 2 VIEW COMPARISON:  CT chest performed April 20, 2018 FINDINGS: Stable postsurgical changes from left upper lobectomy with unchanged elevation of left hemidiaphragm and surgical clips along the left mediastinal border. No consolidation, features of edema, pneumothorax, or effusion. Cardiomediastinal contours are stable. No acute osseous or soft tissue abnormality. Stable post thoracotomy changes of the left chest wall. Degenerative changes in the shoulders and spine. Cholecystectomy clips in the right upper quadrant. IMPRESSION: No active cardiopulmonary disease. Postsurgical changes from prior thoracotomy and left upper lobectomy. Electronically Signed   By: MD Lovena Le   On:  04/17/2019 16:49   Ct Chest W Contrast  Result Date: 04/17/2019 CLINICAL DATA:  Left-sided abdominal pain with vomiting and diarrhea for 1 day. History of lung cancer. Wedge resection in 2006. EXAM: CT CHEST, ABDOMEN, AND PELVIS WITH CONTRAST TECHNIQUE: Multidetector CT imaging of the chest, abdomen and pelvis was performed following the standard protocol during bolus administration of intravenous contrast. CONTRAST:  170mL OMNIPAQUE IOHEXOL 300 MG/ML  SOLN COMPARISON:  Chest CT 04/20/2018 FINDINGS: CT CHEST FINDINGS Cardiovascular: The heart is normal in size. No pericardial effusion. The aorta is normal in caliber. Scattered atherosclerotic calcifications. Moderate three-vessel coronary artery calcifications In the descending thoracic aorta there is a long segment of atherosclerotic disease with what appears to be possible ulcerated plaque and loose debris in the aortic lumen. This is certainly a worrisome finding and it appears the patient has infarct of the lower pole region upper left kidney consistent with embolic phenomenon. There may be a small infarct in the upper aspect of the spleen also. Mediastinum/Nodes: No mediastinal or hilar mass or adenopathy. The esophagus is grossly normal Lungs/Pleura: Advanced emphysematous changes and areas of pulmonary scarring. Surgical changes involving the left lung from a prior wedge resection. There is a 14 mm ground-glass opacity in the right upper lobe on image number 22. This has slowly enlarged over the past several CT scans and is somewhat suspicious for a slow growing adenocarcinoma. Musculoskeletal: No breast masses, supraclavicular or axillary adenopathy. The thyroid gland appears normal. The bony structures are intact. CT ABDOMEN PELVIS FINDINGS Hepatobiliary: Diffuse fatty infiltration of the liver but no focal hepatic lesions or intrahepatic biliary dilatation. The gallbladder is surgically absent. No common bile duct dilatation. Pancreas: No mass,  inflammation or ductal dilatation. Spleen: Band of low attenuation in the upper aspect of the spleen worrisome for a small infarct. Adrenals/Urinary Tract: The adrenal glands are normal. Extensive scarring changes involving the right kidney with several cortical defects but no hydronephrosis or mass. Large area of low attenuation involving the lower pole region of left kidney most consistent with renal infarct. I think pyelonephritis is less likely given the lack of surrounding inflammatory change and the fact that the patient has debris/soft plaque in the upstream aorta. The bladder is unremarkable. Stomach/Bowel: Stomach, duodenum  and small bowel are unremarkable. No acute inflammatory changes, mass lesions or obstructive findings. The terminal ileum appears normal. The appendix is surgically absent. Mild fibrofatty infiltrated type changes involving the ascending colon likely due to remote inflammatory bowel disease. Vascular/Lymphatic: Moderate atherosclerotic calcifications involving the aorta and branch vessels and iliac arteries. Reproductive: The uterus and right ovary appear normal. Left ovary is quite small. Other: No free pelvic fluid collections or pelvic mass. No pelvic adenopathy. No inguinal mass or adenopathy. Musculoskeletal: No significant bony findings. IMPRESSION: 1. Long segment (over 6 cm) of probable ulcerated plaque/soft plaque and loose debris in the descending thoracic aorta. No obvious dissection. Patient probably needs anticoagulation and vascular surgery consultation. 2. Splenic and lower pole left renal infarcts likely due to above finding. 3. Surgical changes involving the left lung but no findings for recurrent tumor. 4. Slowly enlarging ground-glass nodule in the right lung apex worrisome for slow growing adenocarcinoma. 5. Advanced emphysematous changes and pulmonary scarring. 6. Age advanced atherosclerotic calcifications involving the thoracic and abdominal aorta and branch  vessels including the coronary arteries. 7. Diffuse fatty infiltration of the liver. 8. Remote scarring changes involving both kidneys. 9. Status post cholecystectomy.  No biliary dilatation. Electronically Signed   By: Marijo Sanes M.D.   On: 04/17/2019 17:27   Ct Abdomen Pelvis W Contrast  Result Date: 04/17/2019 CLINICAL DATA:  Left-sided abdominal pain with vomiting and diarrhea for 1 day. History of lung cancer. Wedge resection in 2006. EXAM: CT CHEST, ABDOMEN, AND PELVIS WITH CONTRAST TECHNIQUE: Multidetector CT imaging of the chest, abdomen and pelvis was performed following the standard protocol during bolus administration of intravenous contrast. CONTRAST:  119mL OMNIPAQUE IOHEXOL 300 MG/ML  SOLN COMPARISON:  Chest CT 04/20/2018 FINDINGS: CT CHEST FINDINGS Cardiovascular: The heart is normal in size. No pericardial effusion. The aorta is normal in caliber. Scattered atherosclerotic calcifications. Moderate three-vessel coronary artery calcifications In the descending thoracic aorta there is a long segment of atherosclerotic disease with what appears to be possible ulcerated plaque and loose debris in the aortic lumen. This is certainly a worrisome finding and it appears the patient has infarct of the lower pole region upper left kidney consistent with embolic phenomenon. There may be a small infarct in the upper aspect of the spleen also. Mediastinum/Nodes: No mediastinal or hilar mass or adenopathy. The esophagus is grossly normal Lungs/Pleura: Advanced emphysematous changes and areas of pulmonary scarring. Surgical changes involving the left lung from a prior wedge resection. There is a 14 mm ground-glass opacity in the right upper lobe on image number 22. This has slowly enlarged over the past several CT scans and is somewhat suspicious for a slow growing adenocarcinoma. Musculoskeletal: No breast masses, supraclavicular or axillary adenopathy. The thyroid gland appears normal. The bony structures  are intact. CT ABDOMEN PELVIS FINDINGS Hepatobiliary: Diffuse fatty infiltration of the liver but no focal hepatic lesions or intrahepatic biliary dilatation. The gallbladder is surgically absent. No common bile duct dilatation. Pancreas: No mass, inflammation or ductal dilatation. Spleen: Band of low attenuation in the upper aspect of the spleen worrisome for a small infarct. Adrenals/Urinary Tract: The adrenal glands are normal. Extensive scarring changes involving the right kidney with several cortical defects but no hydronephrosis or mass. Large area of low attenuation involving the lower pole region of left kidney most consistent with renal infarct. I think pyelonephritis is less likely given the lack of surrounding inflammatory change and the fact that the patient has debris/soft plaque in the upstream  aorta. The bladder is unremarkable. Stomach/Bowel: Stomach, duodenum and small bowel are unremarkable. No acute inflammatory changes, mass lesions or obstructive findings. The terminal ileum appears normal. The appendix is surgically absent. Mild fibrofatty infiltrated type changes involving the ascending colon likely due to remote inflammatory bowel disease. Vascular/Lymphatic: Moderate atherosclerotic calcifications involving the aorta and branch vessels and iliac arteries. Reproductive: The uterus and right ovary appear normal. Left ovary is quite small. Other: No free pelvic fluid collections or pelvic mass. No pelvic adenopathy. No inguinal mass or adenopathy. Musculoskeletal: No significant bony findings. IMPRESSION: 1. Long segment (over 6 cm) of probable ulcerated plaque/soft plaque and loose debris in the descending thoracic aorta. No obvious dissection. Patient probably needs anticoagulation and vascular surgery consultation. 2. Splenic and lower pole left renal infarcts likely due to above finding. 3. Surgical changes involving the left lung but no findings for recurrent tumor. 4. Slowly enlarging  ground-glass nodule in the right lung apex worrisome for slow growing adenocarcinoma. 5. Advanced emphysematous changes and pulmonary scarring. 6. Age advanced atherosclerotic calcifications involving the thoracic and abdominal aorta and branch vessels including the coronary arteries. 7. Diffuse fatty infiltration of the liver. 8. Remote scarring changes involving both kidneys. 9. Status post cholecystectomy.  No biliary dilatation. Electronically Signed   By: Marijo Sanes M.D.   On: 04/17/2019 17:27     Personally Reviewed Labs: CBC: Recent Labs  Lab 04/17/19 1341  WBC 19.2*  HGB 16.6*  HCT 51.1*  MCV 92.7  PLT 458   Basic Metabolic Panel: Recent Labs  Lab 04/17/19 1341  NA 133*  K 4.8  CL 95*  CO2 18*  GLUCOSE 391*  BUN 19  CREATININE 0.80  CALCIUM 9.7   GFR: Estimated Creatinine Clearance: 82.5 mL/min (by C-G formula based on SCr of 0.8 mg/dL). Liver Function Tests: Recent Labs  Lab 04/17/19 1341  AST 24  ALT 36  ALKPHOS 125  BILITOT 1.1  PROT 8.4*  ALBUMIN 4.7   Recent Labs  Lab 04/17/19 1341  LIPASE 32   No results for input(s): AMMONIA in the last 168 hours. Coagulation Profile: No results for input(s): INR, PROTIME in the last 168 hours. Cardiac Enzymes: No results for input(s): CKTOTAL, CKMB, CKMBINDEX, TROPONINI in the last 168 hours. BNP (last 3 results) No results for input(s): PROBNP in the last 8760 hours. HbA1C: No results for input(s): HGBA1C in the last 72 hours. CBG: Recent Labs  Lab 04/17/19 1605 04/17/19 1713 04/17/19 1823  GLUCAP 347* 297* 241*   Lipid Profile: No results for input(s): CHOL, HDL, LDLCALC, TRIG, CHOLHDL, LDLDIRECT in the last 72 hours. Thyroid Function Tests: No results for input(s): TSH, T4TOTAL, FREET4, T3FREE, THYROIDAB in the last 72 hours. Anemia Panel: No results for input(s): VITAMINB12, FOLATE, FERRITIN, TIBC, IRON, RETICCTPCT in the last 72 hours. Urine analysis:    Component Value Date/Time    COLORURINE STRAW (A) 04/17/2019 1203   APPEARANCEUR CLEAR 04/17/2019 1203   LABSPEC >1.046 (H) 04/17/2019 1203   PHURINE 5.0 04/17/2019 1203   GLUCOSEU >=500 (A) 04/17/2019 1203   HGBUR LARGE (A) 04/17/2019 1203   BILIRUBINUR NEGATIVE 04/17/2019 1203   KETONESUR 20 (A) 04/17/2019 1203   PROTEINUR 30 (A) 04/17/2019 1203   UROBILINOGEN 0.2 09/10/2008 0653   NITRITE NEGATIVE 04/17/2019 1203   LEUKOCYTESUR NEGATIVE 04/17/2019 1203    Sepsis Labs:  Leukocytosis  Personally Reviewed EKG:  Normal sinus rhythm without acute ischemic finding  Assessment/Plan Emesis/abdominal pain: could be due to mild evolving DKA.  This could also be referred pain from descending thoracic aorta with possible splenic and renal infarct.  Currently she is pain-free.  No emesis in ED. -PRN Zofran -Treat possible causes as below.  Hyperglycemia with evolving DKA/poorly controlled NIDDM-2: ABG 7.3/41/<31/20.  Bicarb 18.  Anion gap 20.  Started on insulin drip and IV fluids in ED. -Transition to subcu insulin with IV fluid -Check A1c and lipid panel -CBG monitoring -Started high-dose statin.  Descending thoracic order plaque rupture: CT angiogram revealed long segment (> 6 cm) of probable ulcerated plaque/soft plaque and loose debris's in the descending thoracic cord without evidence of dissection, splenic and lower pole left renal infarcts. Per ED PA, vascular surgery, Dr. Scot Dock consulted and recommended starting anticoagulation and outpatient follow-up.  -Started heparin drip-can transition to p.o. in the morning -Started aspirin and high-dose statin.  History of adenocarcinoma status post LUL wedge resection: Followed by oncology.  -CTA chest showed slowly enlarging groundglass nodules in right lung apex concerning for slow-growing adenocarcinoma -She has upcoming appointment with oncology  Tobacco use disorder: Smokes about 1.5 pack a day -Counseled to quit -Declined nicotine patch but will order as  needed.  DVT prophylaxis: Heparin drip  Code Status: Full code Family Communication: Patient update family let me know if any question  Disposition Plan: Admit to telemetry. Consults called: Vascular surgery per ED PA Admission status: Observation status.   Mercy Riding MD [facility]  If 7PM-7AM, please contact night-coverage [email or web address] Password [facility]  04/17/2019, 7:21 PM

## 2019-04-18 ENCOUNTER — Other Ambulatory Visit: Payer: Self-pay

## 2019-04-18 DIAGNOSIS — Z79899 Other long term (current) drug therapy: Secondary | ICD-10-CM | POA: Diagnosis not present

## 2019-04-18 DIAGNOSIS — I2510 Atherosclerotic heart disease of native coronary artery without angina pectoris: Secondary | ICD-10-CM | POA: Diagnosis present

## 2019-04-18 DIAGNOSIS — N280 Ischemia and infarction of kidney: Secondary | ICD-10-CM | POA: Diagnosis not present

## 2019-04-18 DIAGNOSIS — J449 Chronic obstructive pulmonary disease, unspecified: Secondary | ICD-10-CM | POA: Diagnosis not present

## 2019-04-18 DIAGNOSIS — Z90711 Acquired absence of uterus with remaining cervical stump: Secondary | ICD-10-CM | POA: Diagnosis not present

## 2019-04-18 DIAGNOSIS — R109 Unspecified abdominal pain: Secondary | ICD-10-CM | POA: Diagnosis present

## 2019-04-18 DIAGNOSIS — Z1159 Encounter for screening for other viral diseases: Secondary | ICD-10-CM | POA: Diagnosis not present

## 2019-04-18 DIAGNOSIS — R69 Illness, unspecified: Secondary | ICD-10-CM | POA: Diagnosis not present

## 2019-04-18 DIAGNOSIS — E1110 Type 2 diabetes mellitus with ketoacidosis without coma: Secondary | ICD-10-CM | POA: Diagnosis not present

## 2019-04-18 DIAGNOSIS — Z880 Allergy status to penicillin: Secondary | ICD-10-CM | POA: Diagnosis not present

## 2019-04-18 DIAGNOSIS — F17210 Nicotine dependence, cigarettes, uncomplicated: Secondary | ICD-10-CM | POA: Diagnosis present

## 2019-04-18 DIAGNOSIS — I700 Atherosclerosis of aorta: Secondary | ICD-10-CM | POA: Diagnosis present

## 2019-04-18 DIAGNOSIS — R739 Hyperglycemia, unspecified: Secondary | ICD-10-CM | POA: Diagnosis not present

## 2019-04-18 DIAGNOSIS — F419 Anxiety disorder, unspecified: Secondary | ICD-10-CM | POA: Diagnosis present

## 2019-04-18 DIAGNOSIS — I252 Old myocardial infarction: Secondary | ICD-10-CM | POA: Diagnosis not present

## 2019-04-18 DIAGNOSIS — E785 Hyperlipidemia, unspecified: Secondary | ICD-10-CM | POA: Diagnosis not present

## 2019-04-18 DIAGNOSIS — E871 Hypo-osmolality and hyponatremia: Secondary | ICD-10-CM | POA: Diagnosis not present

## 2019-04-18 DIAGNOSIS — Z7901 Long term (current) use of anticoagulants: Secondary | ICD-10-CM | POA: Diagnosis not present

## 2019-04-18 DIAGNOSIS — Z85118 Personal history of other malignant neoplasm of bronchus and lung: Secondary | ICD-10-CM | POA: Diagnosis not present

## 2019-04-18 DIAGNOSIS — C3492 Malignant neoplasm of unspecified part of left bronchus or lung: Secondary | ICD-10-CM | POA: Diagnosis not present

## 2019-04-18 DIAGNOSIS — Z7984 Long term (current) use of oral hypoglycemic drugs: Secondary | ICD-10-CM | POA: Diagnosis not present

## 2019-04-18 DIAGNOSIS — E1151 Type 2 diabetes mellitus with diabetic peripheral angiopathy without gangrene: Secondary | ICD-10-CM | POA: Diagnosis present

## 2019-04-18 DIAGNOSIS — D735 Infarction of spleen: Secondary | ICD-10-CM | POA: Diagnosis present

## 2019-04-18 DIAGNOSIS — K219 Gastro-esophageal reflux disease without esophagitis: Secondary | ICD-10-CM | POA: Diagnosis not present

## 2019-04-18 DIAGNOSIS — K5790 Diverticulosis of intestine, part unspecified, without perforation or abscess without bleeding: Secondary | ICD-10-CM | POA: Diagnosis present

## 2019-04-18 DIAGNOSIS — E1169 Type 2 diabetes mellitus with other specified complication: Secondary | ICD-10-CM | POA: Diagnosis not present

## 2019-04-18 DIAGNOSIS — H0260 Xanthelasma of unspecified eye, unspecified eyelid: Secondary | ICD-10-CM | POA: Diagnosis present

## 2019-04-18 DIAGNOSIS — Z9049 Acquired absence of other specified parts of digestive tract: Secondary | ICD-10-CM | POA: Diagnosis not present

## 2019-04-18 DIAGNOSIS — I10 Essential (primary) hypertension: Secondary | ICD-10-CM | POA: Diagnosis not present

## 2019-04-18 LAB — BASIC METABOLIC PANEL
Anion gap: 11 (ref 5–15)
Anion gap: 8 (ref 5–15)
Anion gap: 9 (ref 5–15)
Anion gap: 8 (ref 5–15)
Anion gap: 9 (ref 5–15)
Anion gap: 9 (ref 5–15)
BUN: 13 mg/dL (ref 6–20)
BUN: 12 mg/dL (ref 6–20)
BUN: 12 mg/dL (ref 6–20)
BUN: 14 mg/dL (ref 6–20)
BUN: 13 mg/dL (ref 6–20)
BUN: 11 mg/dL (ref 6–20)
CO2: 21 mmol/L — ABNORMAL LOW (ref 22–32)
CO2: 20 mmol/L — ABNORMAL LOW (ref 22–32)
CO2: 24 mmol/L (ref 22–32)
CO2: 21 mmol/L — ABNORMAL LOW (ref 22–32)
CO2: 21 mmol/L — ABNORMAL LOW (ref 22–32)
CO2: 21 mmol/L — ABNORMAL LOW (ref 22–32)
Calcium: 8.6 mg/dL — ABNORMAL LOW (ref 8.9–10.3)
Calcium: 8.6 mg/dL — ABNORMAL LOW (ref 8.9–10.3)
Calcium: 8.8 mg/dL — ABNORMAL LOW (ref 8.9–10.3)
Calcium: 8.5 mg/dL — ABNORMAL LOW (ref 8.9–10.3)
Calcium: 8.2 mg/dL — ABNORMAL LOW (ref 8.9–10.3)
Calcium: 8.3 mg/dL — ABNORMAL LOW (ref 8.9–10.3)
Chloride: 101 mmol/L (ref 98–111)
Chloride: 106 mmol/L (ref 98–111)
Chloride: 103 mmol/L (ref 98–111)
Chloride: 104 mmol/L (ref 98–111)
Chloride: 99 mmol/L (ref 98–111)
Chloride: 105 mmol/L (ref 98–111)
Creatinine, Ser: 0.58 mg/dL (ref 0.44–1.00)
Creatinine, Ser: 0.53 mg/dL (ref 0.44–1.00)
Creatinine, Ser: 0.57 mg/dL (ref 0.44–1.00)
Creatinine, Ser: 0.58 mg/dL (ref 0.44–1.00)
Creatinine, Ser: 0.56 mg/dL (ref 0.44–1.00)
Creatinine, Ser: 0.43 mg/dL — ABNORMAL LOW (ref 0.44–1.00)
GFR calc Af Amer: >60 mL/min (ref >60)
GFR calc Af Amer: >60 mL/min (ref >60)
GFR calc Af Amer: >60 mL/min (ref >60)
GFR calc Af Amer: >60 mL/min (ref >60)
GFR calc Af Amer: >60 mL/min (ref >60)
GFR calc Af Amer: >60 mL/min (ref >60)
GFR calc non Af Amer: >60 mL/min (ref >60)
GFR calc non Af Amer: >60 mL/min (ref >60)
GFR calc non Af Amer: >60 mL/min (ref >60)
GFR calc non Af Amer: >60 mL/min (ref >60)
GFR calc non Af Amer: >60 mL/min (ref >60)
GFR calc non Af Amer: >60 mL/min (ref >60)
Glucose, Bld: 259 mg/dL — ABNORMAL HIGH (ref 70–99)
Glucose, Bld: 289 mg/dL — ABNORMAL HIGH (ref 70–99)
Glucose, Bld: 160 mg/dL — ABNORMAL HIGH (ref 70–99)
Glucose, Bld: 315 mg/dL — ABNORMAL HIGH (ref 70–99)
Glucose, Bld: 277 mg/dL — ABNORMAL HIGH (ref 70–99)
Glucose, Bld: 256 mg/dL — ABNORMAL HIGH (ref 70–99)
Potassium: 3.9 mmol/L (ref 3.5–5.1)
Potassium: 3.8 mmol/L (ref 3.5–5.1)
Potassium: 3.3 mmol/L — ABNORMAL LOW (ref 3.5–5.1)
Potassium: 3.6 mmol/L (ref 3.5–5.1)
Potassium: 3.9 mmol/L (ref 3.5–5.1)
Potassium: 3.3 mmol/L — ABNORMAL LOW (ref 3.5–5.1)
Sodium: 133 mmol/L — ABNORMAL LOW (ref 135–145)
Sodium: 134 mmol/L — ABNORMAL LOW (ref 135–145)
Sodium: 136 mmol/L (ref 135–145)
Sodium: 133 mmol/L — ABNORMAL LOW (ref 135–145)
Sodium: 129 mmol/L — ABNORMAL LOW (ref 135–145)
Sodium: 135 mmol/L (ref 135–145)

## 2019-04-18 LAB — CBC
HCT: 44.2 % (ref 36.0–46.0)
Hemoglobin: 14.2 g/dL (ref 12.0–15.0)
MCH: 29.8 pg (ref 26.0–34.0)
MCHC: 32.1 g/dL (ref 30.0–36.0)
MCV: 92.7 fL (ref 80.0–100.0)
Platelets: 231 10*3/uL (ref 150–400)
RBC: 4.77 MIL/uL (ref 3.87–5.11)
RDW: 13.2 % (ref 11.5–15.5)
WBC: 14 10*3/uL — ABNORMAL HIGH (ref 4.0–10.5)
nRBC: 0 % (ref 0.0–0.2)

## 2019-04-18 LAB — GLUCOSE, CAPILLARY
Glucose-Capillary: 272 mg/dL — ABNORMAL HIGH (ref 70–99)
Glucose-Capillary: 287 mg/dL — ABNORMAL HIGH (ref 70–99)
Glucose-Capillary: 266 mg/dL — ABNORMAL HIGH (ref 70–99)
Glucose-Capillary: 235 mg/dL — ABNORMAL HIGH (ref 70–99)
Glucose-Capillary: 214 mg/dL — ABNORMAL HIGH (ref 70–99)
Glucose-Capillary: 210 mg/dL — ABNORMAL HIGH (ref 70–99)
Glucose-Capillary: 96 mg/dL (ref 70–99)
Glucose-Capillary: 280 mg/dL — ABNORMAL HIGH (ref 70–99)
Glucose-Capillary: 276 mg/dL — ABNORMAL HIGH (ref 70–99)
Glucose-Capillary: 203 mg/dL — ABNORMAL HIGH (ref 70–99)

## 2019-04-18 LAB — NOVEL CORONAVIRUS, NAA (HOSP ORDER, SEND-OUT TO REF LAB; TAT 18-24 HRS): SARS-CoV-2, NAA: NOT DETECTED

## 2019-04-18 LAB — HEPARIN LEVEL (UNFRACTIONATED)
Heparin Unfractionated: 0.33 IU/mL (ref 0.30–0.70)
Heparin Unfractionated: 0.27 IU/mL — ABNORMAL LOW (ref 0.30–0.70)
Heparin Unfractionated: 0.48 IU/mL (ref 0.30–0.70)

## 2019-04-18 LAB — MRSA PCR SCREENING: MRSA by PCR: NEGATIVE

## 2019-04-18 MED ORDER — BUSPIRONE HCL 5 MG PO TABS
5.0000 mg | ORAL_TABLET | Freq: Three times a day (TID) | ORAL | Status: DC
  Administered 2019-04-18 – 2019-04-19 (×4): 5 mg via ORAL
  Filled 2019-04-18 (×4): qty 1

## 2019-04-18 MED ORDER — INSULIN REGULAR(HUMAN) IN NACL 100-0.9 UT/100ML-% IV SOLN
INTRAVENOUS | Status: DC
  Administered 2019-04-18: 2.3 [IU]/h via INTRAVENOUS
  Filled 2019-04-18: qty 100

## 2019-04-18 MED ORDER — SODIUM CHLORIDE 0.9 % IV SOLN
INTRAVENOUS | Status: DC
  Administered 2019-04-18 – 2019-04-19 (×3): via INTRAVENOUS

## 2019-04-18 MED ORDER — LIVING WELL WITH DIABETES BOOK
Freq: Once | Status: AC
  Administered 2019-04-18: 11:00:00

## 2019-04-18 MED ORDER — INSULIN ASPART 100 UNIT/ML ~~LOC~~ SOLN
0.0000 [IU] | Freq: Every day | SUBCUTANEOUS | Status: DC
  Administered 2019-04-18: 3 [IU] via SUBCUTANEOUS

## 2019-04-18 MED ORDER — INSULIN GLARGINE 100 UNIT/ML ~~LOC~~ SOLN
12.0000 [IU] | Freq: Once | SUBCUTANEOUS | Status: AC
  Administered 2019-04-18: 12 [IU] via SUBCUTANEOUS
  Filled 2019-04-18: qty 0.12

## 2019-04-18 MED ORDER — POTASSIUM CHLORIDE 10 MEQ/100ML IV SOLN: 10.0000 meq | INTRAVENOUS | Status: DC

## 2019-04-18 MED ORDER — OXYCODONE HCL 5 MG PO TABS
5.0000 mg | ORAL_TABLET | Freq: Four times a day (QID) | ORAL | Status: DC | PRN
  Administered 2019-04-18 – 2019-04-19 (×3): 5 mg via ORAL
  Filled 2019-04-18 (×3): qty 1

## 2019-04-18 MED ORDER — INSULIN GLARGINE 100 UNIT/ML ~~LOC~~ SOLN
15.0000 [IU] | Freq: Every day | SUBCUTANEOUS | Status: DC
  Administered 2019-04-18: 15 [IU] via SUBCUTANEOUS
  Filled 2019-04-18 (×2): qty 0.15

## 2019-04-18 MED ORDER — SODIUM CHLORIDE 0.9 % IV SOLN: INTRAVENOUS | Status: DC

## 2019-04-18 MED ORDER — INSULIN ASPART 100 UNIT/ML ~~LOC~~ SOLN
0.0000 [IU] | Freq: Three times a day (TID) | SUBCUTANEOUS | Status: DC
  Administered 2019-04-18: 8 [IU] via SUBCUTANEOUS
  Administered 2019-04-19: 5 [IU] via SUBCUTANEOUS
  Administered 2019-04-19: 11 [IU] via SUBCUTANEOUS

## 2019-04-18 MED ORDER — HEPARIN BOLUS VIA INFUSION
1000.0000 [IU] | Freq: Once | INTRAVENOUS | Status: AC
  Administered 2019-04-18: 1000 [IU] via INTRAVENOUS
  Filled 2019-04-18: qty 1000

## 2019-04-18 MED ORDER — INSULIN ASPART 100 UNIT/ML ~~LOC~~ SOLN
12.0000 [IU] | Freq: Once | SUBCUTANEOUS | Status: AC
  Administered 2019-04-18: 12 [IU] via SUBCUTANEOUS

## 2019-04-18 MED ORDER — DEXTROSE-NACL 5-0.45 % IV SOLN
INTRAVENOUS | Status: DC
  Administered 2019-04-18: 07:00:00 via INTRAVENOUS

## 2019-04-18 NOTE — Progress Notes (Signed)
Patient Demographics:    Marissa [name], is a 53 y.o. female, DOB - 1966-03-26, CBU:[id number]  Admit date - 04/17/2019   Admitting Physician Mercy Riding, MD  Outpatient Primary MD for the patient is Marissa School, MD  LOS - 0   Chief Complaint  Patient presents with   Abdominal Pain        Subjective:    [name] today has no fevers, no emesis,  No chest pain, appetite is fair  Assessment  & Plan :    Active Problems:   Hyperglycemia  Brief summary 53 y.o. 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, COPD, tobacco use disorder  admitted on 04/17/2019 with nausea vomiting and found to have DKA  A/p 1)DKA--anion gap is closed, transition from IV insulin to subcu Lantus insulin along with sliding scale coverage--- request diabetic education and dietary education, continue to hydrate IV and orally  2)Probable ulcerated plaque/soft plaque and loose debris's in the descending thoracic cord without evidence of dissection, splenic and lower pole left renal infarcts--- admission CT angiogram findings noted as per vascular surgeon Dr.Dickinson,  continue on Full coagulation and follow-up with vascular surgery as outpatient.  Currently on IV heparin plan to transition to either subcu Lovenox or p.o. Eliquis in a.m.Marland Kitchen  Continue aspirin 81 mg and Lipitor 80 mg  3)History of adenocarcinoma status post LUL wedge resection: Followed by oncology.   4) tobacco abuse--- patient is not ready to quit smoking at this time  5) anxiety disorder--continue BuSpar 5 mg 3 times daily  Disposition/Need for in-Hospital Stay- patient unable to be discharged at this time due to aortic plaque rupture requiring IV heparin and diabetic ketoacidosis requiring IV insulin with transition to subcu insulin*  Code Status : Full  Family Communication:   NA (patient is alert, awake and  coherent)   Disposition Plan  : home in 1 to 2 days continues to improve  Consults  :  Dr. Rachelle Hora (vascular) DVT Prophylaxis  : iv Heparin - SCDs   Lab Results  Component Value Date   PLT 231 04/18/2019    Inpatient Medications  Scheduled Meds:  aspirin EC  81 mg Oral Daily   atorvastatin  80 mg Oral q1800   busPIRone  5 mg Oral TID   insulin aspart  0-15 Units Subcutaneous TID WC   insulin aspart  0-5 Units Subcutaneous QHS   insulin glargine  15 Units Subcutaneous QHS   senna  1 tablet Oral BID   Continuous Infusions:  sodium chloride 125 mL/hr at 04/18/19 0509   sodium chloride 100 mL/hr at 04/18/19 1101   heparin 1,300 Units/hr (04/18/19 1148)   PRN Meds:.acetaminophen **OR** acetaminophen, ondansetron **OR** ondansetron (ZOFRAN) IV, oxyCODONE, polyethylene glycol    Anti-infectives (From admission, onward)   None        Objective:   Vitals:   04/18/19 0800 04/18/19 1141 04/18/19 1210 04/18/19 1614  BP: 139/84  130/80   Pulse: 92     Resp: (!) 23  18   Temp: 98.9 F (37.2 C) 97.8 F (36.6 C)  99 F (37.2 C)  TempSrc: Oral Oral  Oral  SpO2: 95%     Weight:  Height:        Wt Readings from Last 3 Encounters:  04/18/19 72.7 kg  05/02/18 75.6 kg  04/25/17 75.3 kg     Intake/Output Summary (Last 24 hours) at 04/18/2019 1751 Last data filed at 04/18/2019 1658 Gross per 24 hour  Intake 2935.45 ml  Output 500 ml  Net 2435.45 ml    Physical Exam  Gen:- Awake Alert,   HEENT:- [place].AT, No sclera icterus Neck-Supple Neck,No JVD,.  Lungs-  CTAB , fair symmetrical air movement CV- S1, S2 normal, regular  Abd-  +ve B.Sounds, Abd Soft, No tenderness,    Extremity/Skin:- No  edema, pedal pulses present  Psych-affect is appropriate, oriented x3 Neuro-no new focal deficits, no tremors   Data Review:   Micro Results Recent Results (from the past 240 hour(s))  Novel Coronavirus,NAA,(SEND-OUT TO REF LAB - TAT 24-48 hrs); Hosp Order      Status: None   Collection Time: 04/17/19  2:58 PM   Specimen: Nasopharyngeal Swab; Respiratory  Result Value Ref Range Status   SARS-CoV-2, NAA NOT DETECTED NOT DETECTED Final    Comment: (NOTE) This test was developed and its performance characteristics determined by [employer]. This test has not been FDA cleared or approved. This test has been authorized by FDA under an Emergency Use Authorization (EUA). This test is only authorized for the duration of time the declaration that circumstances exist justifying the authorization of the emergency use of in vitro diagnostic tests for detection of SARS-CoV-2 virus and/or diagnosis of COVID-19 infection under section 564(b)(1) of the Act, 21 U.S.C. 578ION-6(E)(9), unless the authorization is terminated or revoked sooner. When diagnostic testing is negative, the possibility of a false negative result should be considered in the context of a patient's recent exposures and the presence of clinical signs and symptoms consistent with COVID-19. An individual without symptoms of COVID-19 and who is not shedding SARS-CoV-2 virus would expect to have a negative (not detected) result in this assay. Performed  At: [facility] [address] [place], [place] [id number] Rush Farmer MD WN:[id number]    Jefferson Davis  Final    Comment: Performed at [facility], [address] [place], [place] [address]  MRSA PCR Screening     Status: None   Collection Time: 04/18/19  4:47 AM   Specimen: Nasal Mucosa; Nasopharyngeal  Result Value Ref Range Status   MRSA by PCR NEGATIVE NEGATIVE Final    Comment:        The GeneXpert MRSA Assay (FDA approved for NASAL specimens only), is one component of a comprehensive MRSA colonization surveillance program. It is not intended to diagnose MRSA infection nor to guide or monitor treatment for MRSA infections. Performed at [facility], [address] [place], [place]  [address]     Radiology Reports Dg Chest 2 View  Result Date: 04/17/2019 CLINICAL DATA:  Left lower quadrant pain, emesis, history of lung adenocarcinoma and left upper lobectomy. EXAM: CHEST - 2 VIEW COMPARISON:  CT chest performed April 20, 2018 FINDINGS: Stable postsurgical changes from left upper lobectomy with unchanged elevation of left hemidiaphragm and surgical clips along the left mediastinal border. No consolidation, features of edema, pneumothorax, or effusion. Cardiomediastinal contours are stable. No acute osseous or soft tissue abnormality. Stable post thoracotomy changes of the left chest wall. Degenerative changes in the shoulders and spine. Cholecystectomy clips in the right upper quadrant. IMPRESSION: No active cardiopulmonary disease. Postsurgical changes from prior thoracotomy and left upper lobectomy. Electronically Signed  By: MD Lovena Le   On: 04/17/2019 16:49   Ct Chest W Contrast  Result Date: 04/17/2019 CLINICAL DATA:  Left-sided abdominal pain with vomiting and diarrhea for 1 day. History of lung cancer. Wedge resection in 2006. EXAM: CT CHEST, ABDOMEN, AND PELVIS WITH CONTRAST TECHNIQUE: Multidetector CT imaging of the chest, abdomen and pelvis was performed following the standard protocol during bolus administration of intravenous contrast. CONTRAST:  159mL OMNIPAQUE IOHEXOL 300 MG/ML  SOLN COMPARISON:  Chest CT 04/20/2018 FINDINGS: CT CHEST FINDINGS Cardiovascular: The heart is normal in size. No pericardial effusion. The aorta is normal in caliber. Scattered atherosclerotic calcifications. Moderate three-vessel coronary artery calcifications In the descending thoracic aorta there is a long segment of atherosclerotic disease with what appears to be possible ulcerated plaque and loose debris in the aortic lumen. This is certainly a worrisome finding and it appears the patient has infarct of the lower pole region upper left kidney consistent with embolic phenomenon. There may be  a small infarct in the upper aspect of the spleen also. Mediastinum/Nodes: No mediastinal or hilar mass or adenopathy. The esophagus is grossly normal Lungs/Pleura: Advanced emphysematous changes and areas of pulmonary scarring. Surgical changes involving the left lung from a prior wedge resection. There is a 14 mm ground-glass opacity in the right upper lobe on image number 22. This has slowly enlarged over the past several CT scans and is somewhat suspicious for a slow growing adenocarcinoma. Musculoskeletal: No breast masses, supraclavicular or axillary adenopathy. The thyroid gland appears normal. The bony structures are intact. CT ABDOMEN PELVIS FINDINGS Hepatobiliary: Diffuse fatty infiltration of the liver but no focal hepatic lesions or intrahepatic biliary dilatation. The gallbladder is surgically absent. No common bile duct dilatation. Pancreas: No mass, inflammation or ductal dilatation. Spleen: Band of low attenuation in the upper aspect of the spleen worrisome for a small infarct. Adrenals/Urinary Tract: The adrenal glands are normal. Extensive scarring changes involving the right kidney with several cortical defects but no hydronephrosis or mass. Large area of low attenuation involving the lower pole region of left kidney most consistent with renal infarct. I think pyelonephritis is less likely given the lack of surrounding inflammatory change and the fact that the patient has debris/soft plaque in the upstream aorta. The bladder is unremarkable. Stomach/Bowel: Stomach, duodenum and small bowel are unremarkable. No acute inflammatory changes, mass lesions or obstructive findings. The terminal ileum appears normal. The appendix is surgically absent. Mild fibrofatty infiltrated type changes involving the ascending colon likely due to remote inflammatory bowel disease. Vascular/Lymphatic: Moderate atherosclerotic calcifications involving the aorta and branch vessels and iliac arteries. Reproductive: The  uterus and right ovary appear normal. Left ovary is quite small. Other: No free pelvic fluid collections or pelvic mass. No pelvic adenopathy. No inguinal mass or adenopathy. Musculoskeletal: No significant bony findings. IMPRESSION: 1. Long segment (over 6 cm) of probable ulcerated plaque/soft plaque and loose debris in the descending thoracic aorta. No obvious dissection. Patient probably needs anticoagulation and vascular surgery consultation. 2. Splenic and lower pole left renal infarcts likely due to above finding. 3. Surgical changes involving the left lung but no findings for recurrent tumor. 4. Slowly enlarging ground-glass nodule in the right lung apex worrisome for slow growing adenocarcinoma. 5. Advanced emphysematous changes and pulmonary scarring. 6. Age advanced atherosclerotic calcifications involving the thoracic and abdominal aorta and branch vessels including the coronary arteries. 7. Diffuse fatty infiltration of the liver. 8. Remote scarring changes involving both kidneys. 9. Status post cholecystectomy.  No biliary dilatation. Electronically Signed   By: Marijo Sanes M.D.   On: 04/17/2019 17:27   Ct Abdomen Pelvis W Contrast  Result Date: 04/17/2019 CLINICAL DATA:  Left-sided abdominal pain with vomiting and diarrhea for 1 day. History of lung cancer. Wedge resection in 2006. EXAM: CT CHEST, ABDOMEN, AND PELVIS WITH CONTRAST TECHNIQUE: Multidetector CT imaging of the chest, abdomen and pelvis was performed following the standard protocol during bolus administration of intravenous contrast. CONTRAST:  165mL OMNIPAQUE IOHEXOL 300 MG/ML  SOLN COMPARISON:  Chest CT 04/20/2018 FINDINGS: CT CHEST FINDINGS Cardiovascular: The heart is normal in size. No pericardial effusion. The aorta is normal in caliber. Scattered atherosclerotic calcifications. Moderate three-vessel coronary artery calcifications In the descending thoracic aorta there is a long segment of atherosclerotic disease with what  appears to be possible ulcerated plaque and loose debris in the aortic lumen. This is certainly a worrisome finding and it appears the patient has infarct of the lower pole region upper left kidney consistent with embolic phenomenon. There may be a small infarct in the upper aspect of the spleen also. Mediastinum/Nodes: No mediastinal or hilar mass or adenopathy. The esophagus is grossly normal Lungs/Pleura: Advanced emphysematous changes and areas of pulmonary scarring. Surgical changes involving the left lung from a prior wedge resection. There is a 14 mm ground-glass opacity in the right upper lobe on image number 22. This has slowly enlarged over the past several CT scans and is somewhat suspicious for a slow growing adenocarcinoma. Musculoskeletal: No breast masses, supraclavicular or axillary adenopathy. The thyroid gland appears normal. The bony structures are intact. CT ABDOMEN PELVIS FINDINGS Hepatobiliary: Diffuse fatty infiltration of the liver but no focal hepatic lesions or intrahepatic biliary dilatation. The gallbladder is surgically absent. No common bile duct dilatation. Pancreas: No mass, inflammation or ductal dilatation. Spleen: Band of low attenuation in the upper aspect of the spleen worrisome for a small infarct. Adrenals/Urinary Tract: The adrenal glands are normal. Extensive scarring changes involving the right kidney with several cortical defects but no hydronephrosis or mass. Large area of low attenuation involving the lower pole region of left kidney most consistent with renal infarct. I think pyelonephritis is less likely given the lack of surrounding inflammatory change and the fact that the patient has debris/soft plaque in the upstream aorta. The bladder is unremarkable. Stomach/Bowel: Stomach, duodenum and small bowel are unremarkable. No acute inflammatory changes, mass lesions or obstructive findings. The terminal ileum appears normal. The appendix is surgically absent. Mild  fibrofatty infiltrated type changes involving the ascending colon likely due to remote inflammatory bowel disease. Vascular/Lymphatic: Moderate atherosclerotic calcifications involving the aorta and branch vessels and iliac arteries. Reproductive: The uterus and right ovary appear normal. Left ovary is quite small. Other: No free pelvic fluid collections or pelvic mass. No pelvic adenopathy. No inguinal mass or adenopathy. Musculoskeletal: No significant bony findings. IMPRESSION: 1. Long segment (over 6 cm) of probable ulcerated plaque/soft plaque and loose debris in the descending thoracic aorta. No obvious dissection. Patient probably needs anticoagulation and vascular surgery consultation. 2. Splenic and lower pole left renal infarcts likely due to above finding. 3. Surgical changes involving the left lung but no findings for recurrent tumor. 4. Slowly enlarging ground-glass nodule in the right lung apex worrisome for slow growing adenocarcinoma. 5. Advanced emphysematous changes and pulmonary scarring. 6. Age advanced atherosclerotic calcifications involving the thoracic and abdominal aorta and branch vessels including the coronary arteries. 7. Diffuse fatty infiltration of the liver. 8. Remote scarring changes  involving both kidneys. 9. Status post cholecystectomy.  No biliary dilatation. Electronically Signed   By: Marijo Sanes M.D.   On: 04/17/2019 17:27     CBC Recent Labs  Lab 04/17/19 1341 04/18/19 0215  WBC 19.2* 14.0*  HGB 16.6* 14.2  HCT 51.1* 44.2  PLT 317 231  MCV 92.7 92.7  MCH 30.1 29.8  MCHC 32.5 32.1  RDW 13.3 13.2    Chemistries  Recent Labs  Lab 04/17/19 1341  04/18/19 0215 04/18/19 0500 04/18/19 0947 04/18/19 1319 04/18/19 1653  NA 133*   < > 133* 134* 136 133* 129*  K 4.8   < > 3.9 3.8 3.3* 3.6 3.9  CL 95*   < > 101 106 103 104 99  CO2 18*   < > 21* 20* 24 21* 21*  GLUCOSE 391*   < > 259* 289* 160* 315* 277*  BUN 19   < > 13 12 12 14 13   CREATININE 0.80   < >  0.58 0.53 0.57 0.58 0.56  CALCIUM 9.7   < > 8.6* 8.6* 8.8* 8.5* 8.2*  MG 2.2  --   --   --   --   --   --   AST 24  --   --   --   --   --   --   ALT 36  --   --   --   --   --   --   ALKPHOS 125  --   --   --   --   --   --   BILITOT 1.1  --   --   --   --   --   --    < > = values in this interval not displayed.   ------------------------------------------------------------------------------------------------------------------ Recent Labs    04/17/19 2034  CHOL 228*  HDL 24*  LDLCALC 152*  TRIG 258*  CHOLHDL 9.5    Lab Results  Component Value Date   HGBA1C (H) 09/11/2008    11.0 (NOTE)   The ADA recommends the following therapeutic goal for glycemic   control related to Hgb A1C measurement:   Goal of Therapy:   < 7.0% Hgb A1C   Reference: American Diabetes Association: Clinical Practice   Recommendations 2008, Diabetes Care,  2008, 31:(Suppl 1).   ------------------------------------------------------------------------------------------------------------------ No results for input(s): TSH, T4TOTAL, T3FREE, THYROIDAB in the last 72 hours.  Invalid input(s): FREET3 ------------------------------------------------------------------------------------------------------------------ No results for input(s): VITAMINB12, FOLATE, FERRITIN, TIBC, IRON, RETICCTPCT in the last 72 hours.  Coagulation profile No results for input(s): INR, PROTIME in the last 168 hours.  No results for input(s): DDIMER in the last 72 hours.  Cardiac Enzymes No results for input(s): CKMB, TROPONINI, MYOGLOBIN in the last 168 hours.  Invalid input(s): CK ------------------------------------------------------------------------------------------------------------------ No results found for: BNP   Roxan Hockey M.D on 04/18/2019 at 5:51 PM  Go to [email or web address] - for contact info  [facility] - Office  [phone]

## 2019-04-18 NOTE — Progress Notes (Signed)
I spoke to Dr. Maudie Mercury because I realized pt had not received any long acting insulin when she was taken off the insulin drip. She was given a meal down in the ED before she came up.  Pt's blood sugars were rising and I was concerned about her flipped back into DKA.  Orders to transfer to SDU for insulin drip.  Report given. Patient transported by [facility], and myself.

## 2019-04-18 NOTE — Progress Notes (Signed)
OT Cancellation Note  Patient Details Name: Marissa [name] MRN: [id number] DOB: [birth year]   Cancelled Treatment:    Reason Eval/Treat Not Completed: OT screened, no needs identified, will sign off. Pt screened in room for OT needs. Pt performing ADLs at baseline independence. No further OT needs at this time.    [name], OTR/L  [phone] 04/18/2019, 8:30 AM

## 2019-04-18 NOTE — Evaluation (Signed)
Physical Therapy Evaluation Patient Details Name: Marissa [name] MRN: [id number] DOB: 09/15/1966 Today's Date: 04/18/2019   History of Present Illness  Marissa [name] is a 53 y.o. 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, COPD, tobacco use disorder and anxiety presenting with emesis and abdominal pain.Patient had emesis and abdominal pain for the last 2 days.  Pain pain is over LLQ and across lower abdomen.  She described the pain as dull. More severe when she lies on the right side. She rated her left flank pain as 7/10 before coming to ED.  Currently her pain has gone.   She denies urinary symptoms.  Had frequent emesis.  She says she had about 4-5 episodes of emesis today.  Emesis was nonbloody.   Last emesis was this morning.   She says she always has loose stool due to metformin. Patient denies fever, URI symptoms, chest pain, dyspnea, headache, vision change or focal neuro symptoms.  She says she has cough which is at baseline.    Clinical Impression  Patient functioning at baseline for functional mobility and gait.  Plan:  Patient discharged from physical therapy to care of nursing for ambulation daily as tolerated for length of stay.     Follow Up Recommendations No PT follow up    Equipment Recommendations  None recommended by PT    Recommendations for Other Services       Precautions / Restrictions Precautions Precautions: None Restrictions Weight Bearing Restrictions: No      Mobility  Bed Mobility Overal bed mobility: Independent                Transfers Overall transfer level: Independent                  Ambulation/Gait Ambulation/Gait assistance: Independent Gait Distance (Feet): 250 Feet Assistive device: None Gait Pattern/deviations: WFL(Within Functional Limits) Gait velocity: normal   General Gait Details: Patient demonstrates good return for ambulation in room and hallways without loss of  balance  Stairs            Wheelchair Mobility    Modified Rankin (Stroke Patients Only)       Balance Overall balance assessment: Independent                                           Pertinent Vitals/Pain Pain Assessment: 0-10 Pain Score: 5  Pain Location: low back and left side of stomach Pain Descriptors / Indicators: Aching Pain Intervention(s): Limited activity within patient's tolerance;Monitored during session    Home Living Family/patient expects to be discharged to:: Private residence Living Arrangements: Spouse/significant other Available Help at Discharge: Family;Available PRN/intermittently Type of Home: Mobile home Home Access: Ramped entrance     Home Layout: One level Home Equipment: None      Prior Function Level of Independence: Independent         Comments: Hydrographic surveyor, drives     Journalist, newspaper        Extremity/Trunk Assessment   Upper Extremity Assessment Upper Extremity Assessment: Overall WFL for tasks assessed    Lower Extremity Assessment Lower Extremity Assessment: Overall WFL for tasks assessed    Cervical / Trunk Assessment Cervical / Trunk Assessment: Normal  Communication   Communication: No difficulties  Cognition Arousal/Alertness: Awake/alert Behavior During Therapy: WFL for tasks assessed/performed Overall Cognitive Status: Within Functional Limits  for tasks assessed                                        General Comments      Exercises     Assessment/Plan    PT Assessment Patent does not need any further PT services  PT Problem List         PT Treatment Interventions      PT Goals (Current goals can be found in the Care Plan section)  Acute Rehab PT Goals Patient Stated Goal: return home PT Goal Formulation: With patient Time For Goal Achievement: 04/18/19 Potential to Achieve Goals: Good    Frequency     Barriers to discharge         Co-evaluation               AM-PAC PT "6 Clicks" Mobility  Outcome Measure Help needed turning from your back to your side while in a flat bed without using bedrails?: None Help needed moving from lying on your back to sitting on the side of a flat bed without using bedrails?: None Help needed moving to and from a bed to a chair (including a wheelchair)?: None Help needed standing up from a chair using your arms (e.g., wheelchair or bedside chair)?: None Help needed to walk in hospital room?: None Help needed climbing 3-5 steps with a railing? : None 6 Click Score: 24    End of Session   Activity Tolerance: Patient tolerated treatment well Patient left: in chair Nurse Communication: Mobility status PT Visit Diagnosis: Unsteadiness on feet (R26.81);Other abnormalities of gait and mobility (R26.89);Muscle weakness (generalized) (M62.81)    Time: 3810-1751 PT Time Calculation (min) (ACUTE ONLY): 16 min   Charges:   PT Evaluation $PT Eval Low Complexity: 1 Low PT Treatments $Gait Training: 8-22 mins        4:00 PM, 04/18/19 Lonell Grandchild, MPT Physical Therapist with [facility] [place] [address] [phone] office [phone] mobile phone

## 2019-04-18 NOTE — Progress Notes (Signed)
ANTICOAGULATION CONSULT NOTE   Pharmacy Consult for Heparin gtt  Indication: thoracic plaque rupture  Allergies  Allergen Reactions  . Bee Venom Anaphylaxis  . Poison Oak Extract Anaphylaxis  . Penicillins Swelling and Rash    Did it involve swelling of the face/tongue/throat, SOB, or low BP? Yes-whole body (swelling) Did it involve sudden or severe rash/hives, skin peeling, or any reaction on the inside of your mouth or nose? Yes-Hives Did you need to seek medical attention at a hospital or doctor's office? Yes When did it last happen?Childhood If all above answers are "NO", may proceed with cephalosporin use.    Patient Measurements: Height: 5\' 5"  (165.1 cm) Weight: 160 lb 4.4 oz (72.7 kg) IBW/kg (Calculated) : 57  HEPARIN DW (KG): 71.7   Vital Signs: Temp: 98.9 F (37.2 C) (07/08 0800) Temp Source: Oral (07/08 0800) BP: 139/84 (07/08 0800) Pulse Rate: 92 (07/08 0800)  Labs: Recent Labs    04/17/19 1341  04/18/19 0215 04/18/19 0500 04/18/19 0947  HGB 16.6*  --  14.2  --   --   HCT 51.1*  --  44.2  --   --   PLT 317  --  231  --   --   HEPARINUNFRC  --   --  0.33  --  0.27*  CREATININE 0.80   < > 0.58 0.53 0.57   < > = values in this interval not displayed.    Estimated Creatinine Clearance: 80.3 mL/min (by C-G formula based on SCr of 0.57 mg/dL).    Assessment: Marissa [name] a 53 y.o. female requires anticoagulation with a heparin iv infusion for the indication of  thoracic plaque rupture. Heparin gtt will be started following pharmacy protocol. Patient is NOT on previous oral anticoagulant that will require aPTT/HL correlation before transitioning to only HL monitoring. Initial Heparin level therapeutic but follow up is just subtherapeutic. Will adjust dosing.   Goal of Therapy:  Heparin level 0.3-0.7 units/ml Monitor platelets by anticoagulation protocol: Yes   Plan:  Heparin bolus 1000 units and increase heparin infusion to 1300 units/hr Check  anti-Xa level in 6 hours and daily while on heparin Continue to monitor H&H and platelets  Isac Sarna, BS [place], BCPS Clinical Pharmacist Pager [phone] 04/18/2019 at 1109

## 2019-04-18 NOTE — Progress Notes (Signed)
ANTICOAGULATION CONSULT NOTE   Pharmacy Consult for Heparin gtt  Indication: thoracic plaque rupture  Allergies  Allergen Reactions  . Bee Venom Anaphylaxis  . Poison Oak Extract Anaphylaxis  . Penicillins Swelling and Rash    Did it involve swelling of the face/tongue/throat, SOB, or low BP? Yes-whole body (swelling) Did it involve sudden or severe rash/hives, skin peeling, or any reaction on the inside of your mouth or nose? Yes-Hives Did you need to seek medical attention at a hospital or doctor's office? Yes When did it last happen?Childhood If all above answers are "NO", may proceed with cephalosporin use.    Patient Measurements: Height: 5\' 5"  (165.1 cm) Weight: 160 lb 12.8 oz (72.9 kg) IBW/kg (Calculated) : 57 Heparin Dosing Weight: HEPARIN DW (KG): 71.8   Vital Signs: Temp: 99 F (37.2 C) (07/07 2146) Temp Source: Oral (07/07 2146) BP: 122/82 (07/07 2146) Pulse Rate: 99 (07/07 2146)  Labs: Recent Labs    04/17/19 1341 04/17/19 2034 04/18/19 0215  HGB 16.6*  --  14.2  HCT 51.1*  --  44.2  PLT 317  --  231  HEPARINUNFRC  --   --  0.33  CREATININE 0.80 0.57  --     Estimated Creatinine Clearance: 80.5 mL/min (by C-G formula based on SCr of 0.57 mg/dL).    Assessment: Marissa [name] a 53 y.o. female requires anticoagulation with a heparin iv infusion for the indication of  thoracic plaque rupture. Heparin gtt will be started following pharmacy protocol. Patient is NOT on previous oral anticoagulant that will require aPTT/HL correlation before transitioning to only HL monitoring.   7/8 AM update:  Initial heparin level is therapeutic   Goal of Therapy:  Heparin level 0.3-0.7 units/ml Monitor platelets by anticoagulation protocol: Yes   Plan:  Cont heparin at 1150 units/hr Confirmatory heparin level at [place], PharmD, [place] Pharmacist Phone: [phone]

## 2019-04-18 NOTE — Progress Notes (Signed)
ANTICOAGULATION CONSULT NOTE   Pharmacy Consult for Heparin gtt  Indication: thoracic plaque rupture  Allergies  Allergen Reactions  . Bee Venom Anaphylaxis  . Poison Oak Extract Anaphylaxis  . Penicillins Swelling and Rash    Did it involve swelling of the face/tongue/throat, SOB, or low BP? Yes-whole body (swelling) Did it involve sudden or severe rash/hives, skin peeling, or any reaction on the inside of your mouth or nose? Yes-Hives Did you need to seek medical attention at a hospital or doctor's office? Yes When did it last happen?Childhood If all above answers are "NO", may proceed with cephalosporin use.    Patient Measurements: Height: 5\' 5"  (165.1 cm) Weight: 160 lb 4.4 oz (72.7 kg) IBW/kg (Calculated) : 57  HEPARIN DW (KG): 71.7   Vital Signs: Temp: 99 F (37.2 C) (07/08 1614) Temp Source: Oral (07/08 1614) BP: 130/80 (07/08 1210) Pulse Rate: 92 (07/08 0800)  Labs: Recent Labs    04/17/19 1341  04/18/19 0215  04/18/19 0947 04/18/19 1319 04/18/19 1653  HGB 16.6*  --  14.2  --   --   --   --   HCT 51.1*  --  44.2  --   --   --   --   PLT 317  --  231  --   --   --   --   HEPARINUNFRC  --   --  0.33  --  0.27*  --  0.48  CREATININE 0.80   < > 0.58   < > 0.57 0.58 0.56   < > = values in this interval not displayed.    Estimated Creatinine Clearance: 80.3 mL/min (by C-G formula based on SCr of 0.56 mg/dL).    Assessment: Marissa [name] a 53 y.o. female requires anticoagulation with a heparin iv infusion for the indication of  thoracic plaque rupture. Heparin gtt will be started following pharmacy protocol.  Heparin level therapeutic    Goal of Therapy:  Heparin level 0.3-0.7 units/ml Monitor platelets by anticoagulation protocol: Yes   Plan:  Continue heparin infusion at 1300 units/hr Check anti-Xa level daily while on heparin Continue to monitor H&H and platelets  Isac Sarna, BS [place], BCPS Clinical Pharmacist Pager  [phone] 04/18/2019 at 1109

## 2019-04-18 NOTE — Progress Notes (Signed)
Inpatient Diabetes Program Recommendations  AACE/ADA: New Consensus Statement on Inpatient Glycemic Control (2015)  Target Ranges:  Prepandial:   less than 140 mg/dL      Peak postprandial:   less than 180 mg/dL (1-2 hours)      Critically ill patients:  140 - 180 mg/dL   Lab Results  Component Value Date   GLUCAP 214 (H) 04/18/2019   HGBA1C (H) 09/11/2008    11.0 (NOTE)   The ADA recommends the following therapeutic goal for glycemic   control related to Hgb A1C measurement:   Goal of Therapy:   < 7.0% Hgb A1C   Reference: American Diabetes Association: Clinical Practice   Recommendations 2008, Diabetes Care,  2008, 31:(Suppl 1).    Review of Glycemic Control Results for Marissa [name], Marissa [name] (MRN [id number]) as of 04/18/2019 09:19  Ref. Range 04/18/2019 04:21 04/18/2019 04:58 04/18/2019 06:03 04/18/2019 06:57 04/18/2019 07:58  Glucose-Capillary Latest Ref Range: 70 - 99 mg/dL 272 (H) 287 (H) 266 (H) 235 (H) 214 (H)   Diabetes history: DM2 Outpatient Diabetes medications: Amaryl 4 mg + Glucotrol 5 mg  Current orders for Inpatient glycemic control: IV insulin with Lantus 10 ordered x 1 and Novolog 10 units x 1  Inpatient Diabetes Program Recommendations:    Please consider: -Increase Lantus to 15 units daily to be started 2 hrs prior to IV insulin stopped. -Add Novolog 4 units tid meal coverage if eats 50% -Novolog sensitive correction tid + hs  Noted patient did not receive basal prior to IV insulin discontinued during the night and IV drip restarted. Please give basal insulin 2 hrs prior to IV insulin drip discontinued and cover CBG with Novolog correction @ time of IV drip discontinued.  Noted patient on Amaryl + Glucotrol both so may need to review and may only order one on discharge home.  Spoke with patient by phone and patient states she is not taking Invokana or Metformin. Invokana was not covered by her insurance and patient was getting yeast infections with Metformin.   Thank  you, Nani Gasser. Elayah Klooster, RN, MSN, CDE  Diabetes Coordinator Inpatient Glycemic Control Team Team Pager [phone] (8am-5pm) 04/18/2019 9:56 AM

## 2019-04-18 NOTE — Plan of Care (Signed)
  Problem: Skin Integrity: Goal: Risk for impaired skin integrity will decrease Outcome: Progressing   Problem: Clinical Measurements: Goal: Ability to maintain clinical measurements within normal limits will improve Outcome: Progressing   Problem: Pain Managment: Goal: General experience of comfort will improve Outcome: Progressing

## 2019-04-19 DIAGNOSIS — E1110 Type 2 diabetes mellitus with ketoacidosis without coma: Principal | ICD-10-CM

## 2019-04-19 DIAGNOSIS — C3492 Malignant neoplasm of unspecified part of left bronchus or lung: Secondary | ICD-10-CM

## 2019-04-19 DIAGNOSIS — Z720 Tobacco use: Secondary | ICD-10-CM | POA: Diagnosis present

## 2019-04-19 DIAGNOSIS — H0263 Xanthelasma of right eye, unspecified eyelid: Secondary | ICD-10-CM | POA: Diagnosis present

## 2019-04-19 DIAGNOSIS — E785 Hyperlipidemia, unspecified: Secondary | ICD-10-CM

## 2019-04-19 DIAGNOSIS — I739 Peripheral vascular disease, unspecified: Secondary | ICD-10-CM | POA: Diagnosis present

## 2019-04-19 DIAGNOSIS — Z7901 Long term (current) use of anticoagulants: Secondary | ICD-10-CM

## 2019-04-19 DIAGNOSIS — E1169 Type 2 diabetes mellitus with other specified complication: Secondary | ICD-10-CM | POA: Diagnosis present

## 2019-04-19 LAB — HEPARIN LEVEL (UNFRACTIONATED): Heparin Unfractionated: 0.38 IU/mL (ref 0.30–0.70)

## 2019-04-19 LAB — GLUCOSE, CAPILLARY
Glucose-Capillary: 220 mg/dL — ABNORMAL HIGH (ref 70–99)
Glucose-Capillary: 305 mg/dL — ABNORMAL HIGH (ref 70–99)

## 2019-04-19 LAB — CBC
HCT: 41.1 % (ref 36.0–46.0)
Hemoglobin: 13.1 g/dL (ref 12.0–15.0)
MCH: 30.1 pg (ref 26.0–34.0)
MCHC: 31.9 g/dL (ref 30.0–36.0)
MCV: 94.5 fL (ref 80.0–100.0)
Platelets: 220 10*3/uL (ref 150–400)
RBC: 4.35 MIL/uL (ref 3.87–5.11)
RDW: 12.7 % (ref 11.5–15.5)
WBC: 10.8 10*3/uL — ABNORMAL HIGH (ref 4.0–10.5)
nRBC: 0 % (ref 0.0–0.2)

## 2019-04-19 LAB — HEMOGLOBIN A1C
Hgb A1c MFr Bld: 12.4 % — ABNORMAL HIGH (ref 4.8–5.6)
Mean Plasma Glucose: 309 mg/dL

## 2019-04-19 LAB — HIV ANTIBODY (ROUTINE TESTING W REFLEX): HIV Screen 4th Generation wRfx: NONREACTIVE

## 2019-04-19 MED ORDER — ELIQUIS 5 MG VTE STARTER PACK: ORAL_TABLET | ORAL | 0 refills | Status: DC

## 2019-04-19 MED ORDER — INSULIN GLARGINE 100 UNIT/ML ~~LOC~~ SOLN
15.0000 [IU] | Freq: Every day | SUBCUTANEOUS | Status: DC
  Administered 2019-04-19: 15 [IU] via SUBCUTANEOUS
  Filled 2019-04-19 (×2): qty 0.15

## 2019-04-19 MED ORDER — APIXABAN 5 MG PO TABS: 5.0000 mg | ORAL_TABLET | Freq: Two times a day (BID) | ORAL | Status: DC

## 2019-04-19 MED ORDER — APIXABAN 5 MG PO TABS
10.0000 mg | ORAL_TABLET | Freq: Two times a day (BID) | ORAL | Status: DC
  Administered 2019-04-19: 10 mg via ORAL
  Filled 2019-04-19 (×2): qty 2

## 2019-04-19 MED ORDER — INSULIN GLARGINE 100 UNIT/ML SOLOSTAR PEN: 20.0000 [IU] | PEN_INJECTOR | Freq: Every day | SUBCUTANEOUS | 11 refills | Status: DC

## 2019-04-19 MED ORDER — LISINOPRIL 10 MG PO TABS: 10.0000 mg | ORAL_TABLET | Freq: Every day | ORAL | 5 refills | Status: DC

## 2019-04-19 MED ORDER — IPRATROPIUM-ALBUTEROL 18-103 MCG/ACT IN AERO: 2.0000 | INHALATION_SPRAY | Freq: Four times a day (QID) | RESPIRATORY_TRACT | 1 refills | Status: AC | PRN

## 2019-04-19 MED ORDER — ATORVASTATIN CALCIUM 80 MG PO TABS: 80.0000 mg | ORAL_TABLET | Freq: Every day | ORAL | 6 refills | Status: AC

## 2019-04-19 MED ORDER — GLIMEPIRIDE 4 MG PO TABS: 4.0000 mg | ORAL_TABLET | Freq: Two times a day (BID) | ORAL | 4 refills | Status: DC

## 2019-04-19 MED ORDER — ACETAMINOPHEN 325 MG PO TABS: 650.0000 mg | ORAL_TABLET | Freq: Four times a day (QID) | ORAL | 0 refills | Status: DC | PRN

## 2019-04-19 MED ORDER — NICOTINE 14 MG/24HR TD PT24: 14.0000 mg | MEDICATED_PATCH | Freq: Every day | TRANSDERMAL | 0 refills | Status: DC

## 2019-04-19 MED ORDER — INSULIN DETEMIR 100 UNIT/ML FLEXPEN: 20.0000 [IU] | PEN_INJECTOR | Freq: Every day | SUBCUTANEOUS | 11 refills | Status: DC

## 2019-04-19 MED ORDER — METFORMIN HCL 500 MG PO TABS: 500.0000 mg | ORAL_TABLET | Freq: Two times a day (BID) | ORAL | 11 refills | Status: DC

## 2019-04-19 MED ORDER — LEVOTHYROXINE SODIUM 50 MCG PO TABS: 50.0000 ug | ORAL_TABLET | Freq: Every day | ORAL | 3 refills | Status: AC

## 2019-04-19 MED ORDER — APIXABAN 5 MG PO TABS: 5.0000 mg | ORAL_TABLET | Freq: Two times a day (BID) | ORAL | 5 refills | Status: DC

## 2019-04-19 MED ORDER — ESOMEPRAZOLE MAGNESIUM 40 MG PO CPDR: 40.0000 mg | DELAYED_RELEASE_CAPSULE | Freq: Every day | ORAL | 3 refills | Status: AC

## 2019-04-19 NOTE — Progress Notes (Signed)
[place] for Heparin gtt --> eliquis Indication: thoracic plaque rupture  Allergies  Allergen Reactions  . Bee Venom Anaphylaxis  . Poison Oak Extract Anaphylaxis  . Penicillins Swelling and Rash    Did it involve swelling of the face/tongue/throat, SOB, or low BP? Yes-whole body (swelling) Did it involve sudden or severe rash/hives, skin peeling, or any reaction on the inside of your mouth or nose? Yes-Hives Did you need to seek medical attention at a hospital or doctor's office? Yes When did it last happen?Childhood If all above answers are "NO", may proceed with cephalosporin use.    Patient Measurements: Height: 5\' 5"  (165.1 cm) Weight: 160 lb 4.4 oz (72.7 kg) IBW/kg (Calculated) : 57  HEPARIN DW (KG): 71.7   Vital Signs: Temp: 98.5 F (36.9 C) (07/09 1100) Temp Source: Oral (07/09 1100) BP: 135/75 (07/09 1100) Pulse Rate: 92 (07/09 1100)  Labs: Recent Labs    04/17/19 1341  04/18/19 0215  04/18/19 0947 04/18/19 1319 04/18/19 1653 04/18/19 2059 04/19/19 0433  HGB 16.6*  --  14.2  --   --   --   --   --  13.1  HCT 51.1*  --  44.2  --   --   --   --   --  41.1  PLT 317  --  231  --   --   --   --   --  220  HEPARINUNFRC  --    < > 0.33  --  0.27*  --  0.48  --  0.38  CREATININE 0.80   < > 0.58   < > 0.57 0.58 0.56 0.43*  --    < > = values in this interval not displayed.    Estimated Creatinine Clearance: 80.3 mL/min (A) (by C-G formula based on SCr of 0.43 mg/dL (L)).    Assessment: Marissa [name] a 53 y.o. female requires anticoagulation with a heparin iv infusion for the indication of  thoracic plaque rupture. Heparin to transition to eliquis now.    Goal of Therapy:  Monitor platelets by anticoagulation protocol: Yes   Plan:  Eliquis 10mg  po BID x 7 days, then 5mg  po bid Educate on eliquis Monitor for S/S of bleeding  Isac Sarna, BS [place], BCPS Clinical Pharmacist Pager [phone] 04/18/2019 at  1109

## 2019-04-19 NOTE — Discharge Instructions (Signed)
1)You are taking Apixaban/Eliquis for blood thinner so Avoid ibuprofen/Advil/Aleve/Motrin/Goody Powders/Naproxen/BC powders/Meloxicam/Diclofenac/Indomethacin and other Nonsteroidal anti-inflammatory medications as these will make you more likely to bleed and can cause stomach ulcers, can also cause Kidney problems.  2) take apixaban/Eliquis as prescribed for blood thinning effect--- avoid injury anticoagulation due to increased risk of bleeding, call or return if dark or bloody stools or bloody urine nosebleeds or any other concerns about bleeding' 3) your diabetes is way out of control----please take Levemir insulin, metformin and glimepiride/Amaryl as prescribed--- diet and lifestyle changes advised 4) smoking cessation strongly encouraged--- okay to use nicotine patch to help you quit smoking 5) please take your cholesterol medicine as prescribed 6) follow-up with vascular surgeon Dr. Deitra Mayo in [place] [phone]) to discuss further management of the ruptured plaque in the descending thoracic aorta

## 2019-04-19 NOTE — Care Management (Signed)
Eliquis coupon given by pharmacist.

## 2019-04-19 NOTE — Progress Notes (Signed)
Inpatient Diabetes Program Recommendations  AACE/ADA: New Consensus Statement on Inpatient Glycemic Control (2015)  Target Ranges:  Prepandial:   less than 140 mg/dL      Peak postprandial:   less than 180 mg/dL (1-2 hours)      Critically ill patients:  140 - 180 mg/dL   Review of Glycemic Control  Diabetes history: DM 2  Current orders for Inpatient glycemic control: Lantus 15 units, Novolog 0-15 units tid, Novolog 0-5 qhs  A1c 12.4%  Inpatient Diabetes Program Recommendations:    Consider Lantus 20 units.   Spoke with patient over the phone regarding A1c level. Discussed plan of care with her. She mentioned being on insulin in the past the the MD told her she was going home on insulin. Described how and when to take insulin and for her to continue to check her glucose.  Patient reported her glucose was int he 300-400 range at home. Told patient to call her PCP every week if glucose remains elevated >200 all the time and/or if glucose is below 70.  Patient was taken off of her insulin and placed on orals in the recent past, however insurance would not cover [place].  Patient would not be a good candidate for Invokana due to hx of yeast infections.  Patient has diarrhea with metformin (discussed taking with food and mentioned the extended release version of the medication to her).  Patient does not have any questions or concerns at this time.  Thanks,  Tama Headings RN, MSN, BC-ADM Inpatient Diabetes Coordinator Team Pager [phone] (8a-5p)'

## 2019-04-19 NOTE — Plan of Care (Signed)

## 2019-04-19 NOTE — Progress Notes (Signed)
ANTICOAGULATION CONSULT NOTE   Pharmacy Consult for Heparin gtt  Indication: thoracic plaque rupture  Allergies  Allergen Reactions  . Bee Venom Anaphylaxis  . Poison Oak Extract Anaphylaxis  . Penicillins Swelling and Rash    Did it involve swelling of the face/tongue/throat, SOB, or low BP? Yes-whole body (swelling) Did it involve sudden or severe rash/hives, skin peeling, or any reaction on the inside of your mouth or nose? Yes-Hives Did you need to seek medical attention at a hospital or doctor's office? Yes When did it last happen?Childhood If all above answers are "NO", may proceed with cephalosporin use.    Patient Measurements: Height: 5\' 5"  (165.1 cm) Weight: 160 lb 4.4 oz (72.7 kg) IBW/kg (Calculated) : 57  HEPARIN DW (KG): 71.7   Vital Signs: Temp: 98.7 F (37.1 C) (07/09 0627) Temp Source: Oral (07/09 0627) BP: 141/77 (07/09 0627) Pulse Rate: 87 (07/09 0627)  Labs: Recent Labs    04/17/19 1341  04/18/19 0215  04/18/19 0947 04/18/19 1319 04/18/19 1653 04/18/19 2059 04/19/19 0433  HGB 16.6*  --  14.2  --   --   --   --   --  13.1  HCT 51.1*  --  44.2  --   --   --   --   --  41.1  PLT 317  --  231  --   --   --   --   --  220  HEPARINUNFRC  --    < > 0.33  --  0.27*  --  0.48  --  0.38  CREATININE 0.80   < > 0.58   < > 0.57 0.58 0.56 0.43*  --    < > = values in this interval not displayed.    Estimated Creatinine Clearance: 80.3 mL/min (A) (by C-G formula based on SCr of 0.43 mg/dL (L)).    Assessment: Marissa [name] a 53 y.o. female requires anticoagulation with a heparin iv infusion for the indication of  thoracic plaque rupture. Heparin gtt will be started following pharmacy protocol.  Heparin level therapeutic this AM at 0.38   Goal of Therapy:  Heparin level 0.3-0.7 units/ml Monitor platelets by anticoagulation protocol: Yes   Plan:  Continue heparin infusion at 1300 units/hr Check anti-Xa level daily while on heparin Continue to  monitor H&H and platelets  Isac Sarna, BS [place], BCPS Clinical Pharmacist Pager [phone] 04/18/2019 at 1109

## 2019-04-19 NOTE — Discharge Summary (Addendum)
Marissa [name], is a 53 y.o. female  DOB 05-17-1966  MRN [id number].  Admission date:  04/17/2019  Admitting Physician  Mercy Riding, MD  Discharge Date:  04/19/2019   Primary MD  Redmond School, MD  Recommendations for primary care physician for things to follow:   1)You are taking Apixaban/Eliquis for blood thinner so Avoid ibuprofen/Advil/Aleve/Motrin/Goody Powders/Naproxen/BC powders/Meloxicam/Diclofenac/Indomethacin and other Nonsteroidal anti-inflammatory medications as these will make you more likely to bleed and can cause stomach ulcers, can also cause Kidney problems.  2) take apixaban/Eliquis as prescribed for blood thinning effect--- avoid injury anticoagulation due to increased risk of bleeding, call or return if dark or bloody stools or bloody urine nosebleeds or any other concerns about bleeding' 3) your diabetes is way out of control----please take Levemir insulin, metformin and glimepiride/Amaryl as prescribed--- diet and lifestyle changes advised 4) smoking cessation strongly encouraged--- okay to use nicotine patch to help you quit smoking 5) please take your cholesterol medicine as prescribed 6) follow-up with vascular surgeon Dr. Deitra Mayo in [place] [phone]) to discuss further management of the ruptured plaque in the descending thoracic aorta   Admission Diagnosis  Atherosclerosis of aorta ([place]) [I70.0] Splenic infarction [D73.5] Renal infarct ([place]) [N28.0] Diabetic ketoacidosis without coma associated with type 2 diabetes mellitus ([place]) [E11.10]   Discharge Diagnosis  Atherosclerosis of aorta ([place]) [I70.0] Splenic infarction [D73.5] Renal infarct ([place]) [N28.0] Diabetic ketoacidosis without coma associated with type 2 diabetes mellitus ([place]) [E11.10]    Principal Problem:   DKA, type 2 ([place]) Active Problems:   PAD (peripheral artery disease)/descending thoracic  aorta plaque rupture   Adenocarcinoma of lung (HCC)   Hyperglycemia   Type 2 diabetes mellitus with other specified complication (HCC)   Anticoagulant long-term use   Tobacco abuse   Hyperlipidemia LDL goal <100   Xanthelasma of eyelid, bilateral/HLD      Past Medical History:  Diagnosis Date   Adenocarcinoma of lung ([place]) 02/06/2014   Anxiety    Arthritis    COPD (chronic obstructive pulmonary disease) ([place])    Coronary artery disease    Diabetes mellitus    GERD (gastroesophageal reflux disease)    Hypertension    Lung cancer ([place])     Past Surgical History:  Procedure Laterality Date   APPENDECTOMY     CHOLECYSTECTOMY     LOBECTOMY Left 2006   [facility] MI  05/2008   lexiscan -perfusion defect in anterior myocardium (breast attenuation); remaining myocardium with NO evidence of ischemia or infarct; EF 78%; low risk scan   OOPHORECTOMY Left    PARTIAL HYSTERECTOMY  1985   SALPINGOOPHORECTOMY Left    benign 9lb mass removed and a massive wall of smaller tumors removed   TRANSTHORACIC ECHOCARDIOGRAM  05/2008   EF =/>55%, mild MR; trace TR       HPI  from the history and physical done on the day of admission:    Chief Complaint: Vomiting and abdominal pain  HPI: Marissa [name] is a 54 y.o.  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, COPD, tobacco use disorder and anxiety presenting with emesis and abdominal pain.  Patient had emesis and abdominal pain for the last 2 days.  Pain pain is over LLQ and across lower abdomen.  She described the pain as dull. More severe when she lies on the right side. She rated her left flank pain as 7/10 before coming to ED.  Currently her pain has gone.   She denies urinary symptoms.  Had frequent emesis.  She says she had about 4-5 episodes of emesis today.  Emesis was nonbloody.   Last emesis was this morning.   She says she always has loose stool due to metformin.  Patient  denies fever, URI symptoms, chest pain, dyspnea, headache, vision change or focal neuro symptoms.  She says she has cough which is at baseline.  She says she is on glipizide and glimepiride at the same time. Reports smoking about 1.5 pack a day.  Denies drinking alcohol recreational drug use.  In ED, afebrile and hemodynamically stable.  Saturating in upper 90s.  WBC 19.2.  On BMP: Sodium 133, CO2 18, anion gap 20, glucose 391.  ABG 7.3 4/41/< 31/20.  EKG normal sinus rhythm.  XR without acute finding.  CTA chest/abdomen/pelvis concerning for long segment (> 6 cm) of probable ulcerated plaque/soft plaque and loose debris's in the descending thoracic cord without evidence of dissection, splenic and lower pole left renal infarcts, slowly enlarging groundglass nodules in right lung apex concerning for slow-growing adenocarcinoma and other chronic findings.  See detailed read below.  Per ED PA, vascular surgery, Dr. Scot Dock consulted and recommended starting anticoagulation and outpatient follow-up.  Patient was started on insulin drip per DKA protocol.  CBG improved to 197 and hospital service was called for admission.     Hospital Course:     Brief summary 53 y.o.femalewith history ofNIDDM-2, adenocarcinoma of LUL status post wedge resection, COPD, tobacco use disorder admitted on 04/17/2019 with nausea vomiting and found to have DKA  A/p 1)DKA--anion gap has closed, DKA resolved, patient was transitioned from IV insulin to subcu insulin.  Diabetic and nutritional education was provided.  A1c 12.4 reflecting uncontrolled DM.  Will discharge home on Levemir 20 units qpm , along with Amaryl with meals and metformin 500 twice daily with meals.  --Hyponatremia resolved as glycemic control improved  2)Probable ulcerated plaque/soft plaque and loose debris's in the descending thoracic cord without evidence of dissection,splenic and lower pole left renal infarcts--- admission CT angiogram findings  noted , I called and Discussed with  vascular surgeon Dr.Dickinson, who reviewed imaging studies, patient is to follow-up with vascular surgeon Dr. Deitra Mayo in [place] [phone]) to discuss further management of the ruptured plaque in the descending thoracic aorta  .  Treated with IV heparin as inpatient per Dr. Scot Dock okay to discharge on p.o. Eliquis , c/n Lipitor 80 mg  3)History of adenocarcinoma status post LUL wedge resection:Followed by oncology.  4)Tobacco abuse--- smoking cessation strongly advised, continue nicotine patch  5)Anxiety disorder--continue BuSpar   Code Status : Full  Family Communication:    (patient is alert, awake and coherent)--- outpatient request I called and spoke with his sister Ms Leslie Andrea   Disposition Plan  : home    Consults  :  Dr. Rachelle Hora (vascular)  Discharge Condition: stable  Follow UP  Diet and Activity recommendation:  As advised  Discharge Instructions    Discharge Instructions  Call MD for:  difficulty breathing, headache or visual disturbances   Complete by: As directed    Call MD for:  persistant dizziness or light-headedness   Complete by: As directed    Call MD for:  persistant nausea and vomiting   Complete by: As directed    Call MD for:  severe uncontrolled pain   Complete by: As directed    Call MD for:  temperature >100.4   Complete by: As directed    Diet - low sodium heart healthy   Complete by: As directed    Diet Carb Modified   Complete by: As directed    Discharge instructions   Complete by: As directed    1)You are taking Apixaban/Eliquis for blood thinner so Avoid ibuprofen/Advil/Aleve/Motrin/Goody Powders/Naproxen/BC powders/Meloxicam/Diclofenac/Indomethacin and other Nonsteroidal anti-inflammatory medications as these will make you more likely to bleed and can cause stomach ulcers, can also cause Kidney problems.  2) take apixaban/Eliquis as prescribed for blood thinning  effect--- avoid injury anticoagulation due to increased risk of bleeding, call or return if dark or bloody stools or bloody urine nosebleeds or any other concerns about bleeding' 3) your diabetes is way out of control----please take Levemir insulin, metformin and glimepiride/Amaryl as prescribed--- diet and lifestyle changes advised 4) smoking cessation strongly encouraged--- okay to use nicotine patch to help you quit smoking 5) please take your cholesterol medicine as prescribed 6) follow-up with vascular surgeon Dr. Deitra Mayo in [place] [phone]) to discuss further management of the ruptured plaque in the descending thoracic aorta   Increase activity slowly   Complete by: As directed       Discharge Medications     Allergies as of 04/19/2019      Reactions   Bee Venom Anaphylaxis   Poison Oak Extract Anaphylaxis   Penicillins Swelling, Rash   Did it involve swelling of the face/tongue/throat, SOB, or low BP? Yes-whole body (swelling) Did it involve sudden or severe rash/hives, skin peeling, or any reaction on the inside of your mouth or nose? Yes-Hives Did you need to seek medical attention at a hospital or doctor's office? Yes When did it last happen?Childhood If all above answers are NO, may proceed with cephalosporin use.      Medication List    STOP taking these medications   enalapril 2.5 MG tablet Commonly known as: VASOTEC   glipiZIDE 5 MG tablet Commonly known as: GLUCOTROL     TAKE these medications   acetaminophen 325 MG tablet Commonly known as: TYLENOL Take 2 tablets (650 mg total) by mouth every 6 (six) hours as needed for mild pain (or Fever >/= 101).   albuterol-ipratropium 18-103 MCG/ACT inhaler Commonly known as: COMBIVENT Inhale 2 puffs into the lungs every 6 (six) hours as needed for wheezing or shortness of breath.   atorvastatin 80 MG tablet Commonly known as: LIPITOR Take 1 tablet (80 mg total) by mouth daily at 6 PM.     busPIRone 30 MG tablet Commonly known as: BUSPAR Take 15 mg by mouth 2 (two) times a day.   Eliquis DVT/PE Starter Pack 5 MG Tabs Take as directed on package: start with two-5mg  tablets twice daily for 7 days. On day 8, switch to one-5mg  tablet twice daily.   apixaban 5 MG Tabs tablet Commonly known as: Eliquis Take 1 tablet (5 mg total) by mouth 2 (two) times daily. Start around 05/18/2019 after completing the starter pack   EpiPen 2-Pak 0.3 mg/0.3 mL Soaj injection Generic drug: EPINEPHrine  Inject 0.3 mg into the muscle as needed for anaphylaxis. Has available   esomeprazole 40 MG capsule Commonly known as: NEXIUM Take 1 capsule (40 mg total) by mouth daily before breakfast.   ezetimibe 10 MG tablet Commonly known as: ZETIA Take 10 mg by mouth daily.   fish oil-omega-3 fatty acids 1000 MG capsule Take 2 g by mouth 2 (two) times a day. 4 tablets daily   glimepiride 4 MG tablet Commonly known as: AMARYL Take 1 tablet (4 mg total) by mouth 2 (two) times daily with a meal. What changed: when to take this   Insulin Detemir 100 UNIT/ML Pen Commonly known as: LEVEMIR Inject 20 Units into the skin at bedtime.   levothyroxine 50 MCG tablet Commonly known as: SYNTHROID Take 1 tablet (50 mcg total) by mouth daily.   lisinopril 10 MG tablet Commonly known as: ZESTRIL Take 1 tablet (10 mg total) by mouth daily. What changed:   medication strength  how much to take   metFORMIN 500 MG tablet Commonly known as: Glucophage Take 1 tablet (500 mg total) by mouth 2 (two) times daily with a meal.   nicotine 14 mg/24hr patch Commonly known as: NICODERM CQ - dosed in mg/24 hours Place 1 patch (14 mg total) onto the skin daily.   oxyCODONE 15 MG immediate release tablet Commonly known as: ROXICODONE Take 15 mg by mouth every 4 (four) hours as needed.   Silenor 6 MG Tabs Generic drug: Doxepin HCl Take 6 mg by mouth at bedtime.   vitamin E 100 UNIT capsule Take 100 Units by  mouth daily.      Major procedures and Radiology Reports - PLEASE review detailed and final reports for all details, in brief -    Dg Chest 2 View  Result Date: 04/17/2019 CLINICAL DATA:  Left lower quadrant pain, emesis, history of lung adenocarcinoma and left upper lobectomy. EXAM: CHEST - 2 VIEW COMPARISON:  CT chest performed April 20, 2018 FINDINGS: Stable postsurgical changes from left upper lobectomy with unchanged elevation of left hemidiaphragm and surgical clips along the left mediastinal border. No consolidation, features of edema, pneumothorax, or effusion. Cardiomediastinal contours are stable. No acute osseous or soft tissue abnormality. Stable post thoracotomy changes of the left chest wall. Degenerative changes in the shoulders and spine. Cholecystectomy clips in the right upper quadrant. IMPRESSION: No active cardiopulmonary disease. Postsurgical changes from prior thoracotomy and left upper lobectomy. Electronically Signed   By: MD Lovena Le   On: 04/17/2019 16:49   Ct Chest W Contrast  Result Date: 04/17/2019 CLINICAL DATA:  Left-sided abdominal pain with vomiting and diarrhea for 1 day. History of lung cancer. Wedge resection in 2006. EXAM: CT CHEST, ABDOMEN, AND PELVIS WITH CONTRAST TECHNIQUE: Multidetector CT imaging of the chest, abdomen and pelvis was performed following the standard protocol during bolus administration of intravenous contrast. CONTRAST:  174mL OMNIPAQUE IOHEXOL 300 MG/ML  SOLN COMPARISON:  Chest CT 04/20/2018 FINDINGS: CT CHEST FINDINGS Cardiovascular: The heart is normal in size. No pericardial effusion. The aorta is normal in caliber. Scattered atherosclerotic calcifications. Moderate three-vessel coronary artery calcifications In the descending thoracic aorta there is a long segment of atherosclerotic disease with what appears to be possible ulcerated plaque and loose debris in the aortic lumen. This is certainly a worrisome finding and it appears the patient  has infarct of the lower pole region upper left kidney consistent with embolic phenomenon. There may be a small infarct in the upper aspect of the spleen  also. Mediastinum/Nodes: No mediastinal or hilar mass or adenopathy. The esophagus is grossly normal Lungs/Pleura: Advanced emphysematous changes and areas of pulmonary scarring. Surgical changes involving the left lung from a prior wedge resection. There is a 14 mm ground-glass opacity in the right upper lobe on image number 22. This has slowly enlarged over the past several CT scans and is somewhat suspicious for a slow growing adenocarcinoma. Musculoskeletal: No breast masses, supraclavicular or axillary adenopathy. The thyroid gland appears normal. The bony structures are intact. CT ABDOMEN PELVIS FINDINGS Hepatobiliary: Diffuse fatty infiltration of the liver but no focal hepatic lesions or intrahepatic biliary dilatation. The gallbladder is surgically absent. No common bile duct dilatation. Pancreas: No mass, inflammation or ductal dilatation. Spleen: Band of low attenuation in the upper aspect of the spleen worrisome for a small infarct. Adrenals/Urinary Tract: The adrenal glands are normal. Extensive scarring changes involving the right kidney with several cortical defects but no hydronephrosis or mass. Large area of low attenuation involving the lower pole region of left kidney most consistent with renal infarct. I think pyelonephritis is less likely given the lack of surrounding inflammatory change and the fact that the patient has debris/soft plaque in the upstream aorta. The bladder is unremarkable. Stomach/Bowel: Stomach, duodenum and small bowel are unremarkable. No acute inflammatory changes, mass lesions or obstructive findings. The terminal ileum appears normal. The appendix is surgically absent. Mild fibrofatty infiltrated type changes involving the ascending colon likely due to remote inflammatory bowel disease. Vascular/Lymphatic: Moderate  atherosclerotic calcifications involving the aorta and branch vessels and iliac arteries. Reproductive: The uterus and right ovary appear normal. Left ovary is quite small. Other: No free pelvic fluid collections or pelvic mass. No pelvic adenopathy. No inguinal mass or adenopathy. Musculoskeletal: No significant bony findings. IMPRESSION: 1. Long segment (over 6 cm) of probable ulcerated plaque/soft plaque and loose debris in the descending thoracic aorta. No obvious dissection. Patient probably needs anticoagulation and vascular surgery consultation. 2. Splenic and lower pole left renal infarcts likely due to above finding. 3. Surgical changes involving the left lung but no findings for recurrent tumor. 4. Slowly enlarging ground-glass nodule in the right lung apex worrisome for slow growing adenocarcinoma. 5. Advanced emphysematous changes and pulmonary scarring. 6. Age advanced atherosclerotic calcifications involving the thoracic and abdominal aorta and branch vessels including the coronary arteries. 7. Diffuse fatty infiltration of the liver. 8. Remote scarring changes involving both kidneys. 9. Status post cholecystectomy.  No biliary dilatation. Electronically Signed   By: Marijo Sanes M.D.   On: 04/17/2019 17:27   Ct Abdomen Pelvis W Contrast  Result Date: 04/17/2019 CLINICAL DATA:  Left-sided abdominal pain with vomiting and diarrhea for 1 day. History of lung cancer. Wedge resection in 2006. EXAM: CT CHEST, ABDOMEN, AND PELVIS WITH CONTRAST TECHNIQUE: Multidetector CT imaging of the chest, abdomen and pelvis was performed following the standard protocol during bolus administration of intravenous contrast. CONTRAST:  157mL OMNIPAQUE IOHEXOL 300 MG/ML  SOLN COMPARISON:  Chest CT 04/20/2018 FINDINGS: CT CHEST FINDINGS Cardiovascular: The heart is normal in size. No pericardial effusion. The aorta is normal in caliber. Scattered atherosclerotic calcifications. Moderate three-vessel coronary artery  calcifications In the descending thoracic aorta there is a long segment of atherosclerotic disease with what appears to be possible ulcerated plaque and loose debris in the aortic lumen. This is certainly a worrisome finding and it appears the patient has infarct of the lower pole region upper left kidney consistent with embolic phenomenon. There may be a small  infarct in the upper aspect of the spleen also. Mediastinum/Nodes: No mediastinal or hilar mass or adenopathy. The esophagus is grossly normal Lungs/Pleura: Advanced emphysematous changes and areas of pulmonary scarring. Surgical changes involving the left lung from a prior wedge resection. There is a 14 mm ground-glass opacity in the right upper lobe on image number 22. This has slowly enlarged over the past several CT scans and is somewhat suspicious for a slow growing adenocarcinoma. Musculoskeletal: No breast masses, supraclavicular or axillary adenopathy. The thyroid gland appears normal. The bony structures are intact. CT ABDOMEN PELVIS FINDINGS Hepatobiliary: Diffuse fatty infiltration of the liver but no focal hepatic lesions or intrahepatic biliary dilatation. The gallbladder is surgically absent. No common bile duct dilatation. Pancreas: No mass, inflammation or ductal dilatation. Spleen: Band of low attenuation in the upper aspect of the spleen worrisome for a small infarct. Adrenals/Urinary Tract: The adrenal glands are normal. Extensive scarring changes involving the right kidney with several cortical defects but no hydronephrosis or mass. Large area of low attenuation involving the lower pole region of left kidney most consistent with renal infarct. I think pyelonephritis is less likely given the lack of surrounding inflammatory change and the fact that the patient has debris/soft plaque in the upstream aorta. The bladder is unremarkable. Stomach/Bowel: Stomach, duodenum and small bowel are unremarkable. No acute inflammatory changes, mass  lesions or obstructive findings. The terminal ileum appears normal. The appendix is surgically absent. Mild fibrofatty infiltrated type changes involving the ascending colon likely due to remote inflammatory bowel disease. Vascular/Lymphatic: Moderate atherosclerotic calcifications involving the aorta and branch vessels and iliac arteries. Reproductive: The uterus and right ovary appear normal. Left ovary is quite small. Other: No free pelvic fluid collections or pelvic mass. No pelvic adenopathy. No inguinal mass or adenopathy. Musculoskeletal: No significant bony findings. IMPRESSION: 1. Long segment (over 6 cm) of probable ulcerated plaque/soft plaque and loose debris in the descending thoracic aorta. No obvious dissection. Patient probably needs anticoagulation and vascular surgery consultation. 2. Splenic and lower pole left renal infarcts likely due to above finding. 3. Surgical changes involving the left lung but no findings for recurrent tumor. 4. Slowly enlarging ground-glass nodule in the right lung apex worrisome for slow growing adenocarcinoma. 5. Advanced emphysematous changes and pulmonary scarring. 6. Age advanced atherosclerotic calcifications involving the thoracic and abdominal aorta and branch vessels including the coronary arteries. 7. Diffuse fatty infiltration of the liver. 8. Remote scarring changes involving both kidneys. 9. Status post cholecystectomy.  No biliary dilatation. Electronically Signed   By: Marijo Sanes M.D.   On: 04/17/2019 17:27    Micro Results    Recent Results (from the past 240 hour(s))  Novel Coronavirus,NAA,(SEND-OUT TO REF LAB - TAT 24-48 hrs); Hosp Order     Status: None   Collection Time: 04/17/19  2:58 PM   Specimen: Nasopharyngeal Swab; Respiratory  Result Value Ref Range Status   SARS-CoV-2, NAA NOT DETECTED NOT DETECTED Final    Comment: (NOTE) This test was developed and its performance characteristics determined by [employer]. This test  has not been FDA cleared or approved. This test has been authorized by FDA under an Emergency Use Authorization (EUA). This test is only authorized for the duration of time the declaration that circumstances exist justifying the authorization of the emergency use of in vitro diagnostic tests for detection of SARS-CoV-2 virus and/or diagnosis of COVID-19 infection under section 564(b)(1) of the Act, 21 U.S.C. 008QPY-1(P)(5), unless the authorization is terminated or revoked  sooner. When diagnostic testing is negative, the possibility of a false negative result should be considered in the context of a patient's recent exposures and the presence of clinical signs and symptoms consistent with COVID-19. An individual without symptoms of COVID-19 and who is not shedding SARS-CoV-2 virus would expect to have a negative (not detected) result in this assay. Performed  At: [facility] [address] [place], [place] [id number] Rush Farmer MD IO:[id number]    [place]  Final    Comment: Performed at [facility], [address], [place], [place] [address]  MRSA PCR Screening     Status: None   Collection Time: 04/18/19  4:47 AM   Specimen: Nasal Mucosa; Nasopharyngeal  Result Value Ref Range Status   MRSA by PCR NEGATIVE NEGATIVE Final    Comment:        The GeneXpert MRSA Assay (FDA approved for NASAL specimens only), is one component of a comprehensive MRSA colonization surveillance program. It is not intended to diagnose MRSA infection nor to guide or monitor treatment for MRSA infections. Performed at [facility], [address] [place], [place] [address]        Today   Subjective    Marissa [name] today has no new complaints..... No fever  Or chills  No nausea, vomiting, diarrhea           Patient has been seen and examined prior to discharge   Objective   Blood pressure 135/75, pulse 92, temperature 98.5 F (36.9 C), temperature  source Oral, resp. rate 18, height 5\' 5"  (1.651 m), weight 72.7 kg, SpO2 99 %.   Intake/Output Summary (Last 24 hours) at 04/19/2019 1332 Last data filed at 04/19/2019 1216 Gross per 24 hour  Intake 3427.48 ml  Output 1900 ml  Net 1527.48 ml    Exam Gen:- Awake Alert, no acute distress  HEENT:- [place].AT, No sclera icterus Neck-Supple Neck,No JVD,.  Lungs-  CTAB , good air movement bilaterally  CV- S1, S2 normal, regular Abd-  +ve B.Sounds, Abd Soft, No tenderness,    Extremity/Skin:- No  edema,   good pulses Psych-affect is appropriate, oriented x3 Neuro-no new focal deficits, no tremors    Data Review   CBC w Diff:  Lab Results  Component Value Date   WBC 10.8 (H) 04/19/2019   HGB 13.1 04/19/2019   HCT 41.1 04/19/2019   PLT 220 04/19/2019   LYMPHOPCT 27 09/10/2008   MONOPCT 8 09/10/2008   EOSPCT 2 09/10/2008   BASOPCT 0 09/10/2008    CMP:  Lab Results  Component Value Date   NA 135 04/18/2019   K 3.3 (L) 04/18/2019   CL 105 04/18/2019   CO2 21 (L) 04/18/2019   BUN 11 04/18/2019   CREATININE 0.43 (L) 04/18/2019   PROT 8.4 (H) 04/17/2019   ALBUMIN 4.7 04/17/2019   BILITOT 1.1 04/17/2019   ALKPHOS 125 04/17/2019   AST 24 04/17/2019   ALT 36 04/17/2019  .   Total Discharge time is about 33 minutes  Roxan Hockey M.D on 04/19/2019 at 1:32 PM  Go to [email or web address] -  for contact info  [facility] - Office  [phone]

## 2019-04-25 DIAGNOSIS — E119 Type 2 diabetes mellitus without complications: Secondary | ICD-10-CM | POA: Diagnosis not present

## 2019-04-25 DIAGNOSIS — G894 Chronic pain syndrome: Secondary | ICD-10-CM | POA: Diagnosis not present

## 2019-04-25 DIAGNOSIS — E860 Dehydration: Secondary | ICD-10-CM | POA: Diagnosis not present

## 2019-04-30 ENCOUNTER — Other Ambulatory Visit: Payer: Self-pay

## 2019-04-30 ENCOUNTER — Ambulatory Visit (HOSPITAL_COMMUNITY): Admission: RE | Admit: 2019-04-30 | Payer: Medicare HMO | Source: Ambulatory Visit

## 2019-04-30 ENCOUNTER — Inpatient Hospital Stay (HOSPITAL_COMMUNITY): Payer: Medicare HMO | Attending: Hematology

## 2019-04-30 DIAGNOSIS — F17210 Nicotine dependence, cigarettes, uncomplicated: Secondary | ICD-10-CM | POA: Insufficient documentation

## 2019-04-30 DIAGNOSIS — C3412 Malignant neoplasm of upper lobe, left bronchus or lung: Secondary | ICD-10-CM | POA: Diagnosis not present

## 2019-04-30 DIAGNOSIS — C3492 Malignant neoplasm of unspecified part of left bronchus or lung: Secondary | ICD-10-CM

## 2019-04-30 DIAGNOSIS — Z800 Family history of malignant neoplasm of digestive organs: Secondary | ICD-10-CM | POA: Diagnosis not present

## 2019-04-30 DIAGNOSIS — R69 Illness, unspecified: Secondary | ICD-10-CM | POA: Diagnosis not present

## 2019-04-30 DIAGNOSIS — Z801 Family history of malignant neoplasm of trachea, bronchus and lung: Secondary | ICD-10-CM | POA: Insufficient documentation

## 2019-04-30 LAB — COMPREHENSIVE METABOLIC PANEL
ALT: 26 U/L (ref 0–44)
AST: 17 U/L (ref 15–41)
Albumin: 3.8 g/dL (ref 3.5–5.0)
Alkaline Phosphatase: 102 U/L (ref 38–126)
Anion gap: 10 (ref 5–15)
BUN: 11 mg/dL (ref 6–20)
CO2: 26 mmol/L (ref 22–32)
Calcium: 9.2 mg/dL (ref 8.9–10.3)
Chloride: 101 mmol/L (ref 98–111)
Creatinine, Ser: 0.56 mg/dL (ref 0.44–1.00)
GFR calc Af Amer: >60 mL/min (ref >60)
GFR calc non Af Amer: >60 mL/min (ref >60)
Glucose, Bld: 261 mg/dL — ABNORMAL HIGH (ref 70–99)
Potassium: 4.2 mmol/L (ref 3.5–5.1)
Sodium: 137 mmol/L (ref 135–145)
Total Bilirubin: 0.6 mg/dL (ref 0.3–1.2)
Total Protein: 7.2 g/dL (ref 6.5–8.1)

## 2019-04-30 LAB — CBC WITH DIFFERENTIAL/PLATELET
Abs Immature Granulocytes: 0.06 10*3/uL (ref 0.00–0.07)
Basophils Absolute: 0.1 10*3/uL (ref 0.0–0.1)
Basophils Relative: 1 %
Eosinophils Absolute: 0.3 10*3/uL (ref 0.0–0.5)
Eosinophils Relative: 3 %
HCT: 44.3 % (ref 36.0–46.0)
Hemoglobin: 14.3 g/dL (ref 12.0–15.0)
Immature Granulocytes: 1 %
Lymphocytes Relative: 26 %
Lymphs Abs: 2.7 10*3/uL (ref 0.7–4.0)
MCH: 30.2 pg (ref 26.0–34.0)
MCHC: 32.3 g/dL (ref 30.0–36.0)
MCV: 93.5 fL (ref 80.0–100.0)
Monocytes Absolute: 0.9 10*3/uL (ref 0.1–1.0)
Monocytes Relative: 8 %
Neutro Abs: 6.4 10*3/uL (ref 1.7–7.7)
Neutrophils Relative %: 61 %
Platelets: 361 10*3/uL (ref 150–400)
RBC: 4.74 MIL/uL (ref 3.87–5.11)
RDW: 12.1 % (ref 11.5–15.5)
WBC: 10.4 10*3/uL (ref 4.0–10.5)
nRBC: 0 % (ref 0.0–0.2)

## 2019-05-03 ENCOUNTER — Other Ambulatory Visit: Payer: Self-pay

## 2019-05-03 ENCOUNTER — Inpatient Hospital Stay (HOSPITAL_BASED_OUTPATIENT_CLINIC_OR_DEPARTMENT_OTHER): Payer: Medicare HMO | Admitting: Hematology

## 2019-05-03 VITALS — BP 104/77 | HR 80 | Temp 97.1°F | Resp 18 | Wt 163.2 lb

## 2019-05-03 DIAGNOSIS — F17210 Nicotine dependence, cigarettes, uncomplicated: Secondary | ICD-10-CM | POA: Diagnosis not present

## 2019-05-03 DIAGNOSIS — Z801 Family history of malignant neoplasm of trachea, bronchus and lung: Secondary | ICD-10-CM

## 2019-05-03 DIAGNOSIS — R69 Illness, unspecified: Secondary | ICD-10-CM | POA: Diagnosis not present

## 2019-05-03 DIAGNOSIS — C3412 Malignant neoplasm of upper lobe, left bronchus or lung: Secondary | ICD-10-CM

## 2019-05-03 DIAGNOSIS — Z800 Family history of malignant neoplasm of digestive organs: Secondary | ICD-10-CM

## 2019-05-03 DIAGNOSIS — C3492 Malignant neoplasm of unspecified part of left bronchus or lung: Secondary | ICD-10-CM

## 2019-05-03 MED ORDER — CHANTIX STARTING MONTH PAK 0.5 MG X 11 & 1 MG X 42 PO TABS: ORAL_TABLET | ORAL | 0 refills | Status: DC

## 2019-05-03 NOTE — Progress Notes (Signed)
Marissa [name], [place] [id number]   CLINIC:  Medical Oncology/Hematology  PCP:  Marissa [name], Marissa [name] [id number] [phone]   REASON FOR VISIT:  Follow-up for Lung cancer  CURRENT THERAPY: Clinical surveillance  BRIEF ONCOLOGIC HISTORY:  Oncology History  Adenocarcinoma of lung ([place])  03/23/2005 Initial Diagnosis   Adenocarcinoma of lung with left upper lobe wedge resection by Dr. Arlyce Dice measuring 1.4 cm.  0/4 lymph nodes.   01/27/2015 Imaging   CT chest- 1. Stable examination demonstrating multiple tiny 2-3 mm pulmonary nodules in the right upper lobe which are likely benign areas of mucoid impaction in terminal bronchioles, in addition to an unchanged 6 x 5 mm ground-glass attenuation nodule in the apex of the right upper lobe. Continued attention on annual followup examinations is recommended to ensure continued stability. 2. Status post left upper lobectomy. No findings to suggest local recurrence of disease. 3. Atherosclerosis, including three-vessel coronary artery disease. Please note that although the presence of coronary artery calcium documents the presence of coronary artery disease, the severity of this disease and any potential stenosis cannot be assessed on this non-gated CT examination. Assessment for potential risk factor modification, dietary therapy or pharmacologic therapy may be warranted, if clinically indicated.   04/19/2017 Imaging   CT chest- 1. No evidence lung cancer recurrence in the LEFT lower lung. 2. Increase in the ground-glass surrounding a RIGHT upper lobe pulmonary nodule. Minimal size increased over 3 year interval. Recommend follow-up CT in 12 months for part solid nodule. This recommendation follows the consensus statement: Guidelines for Management of Incidental Pulmonary Nodules Detected on CT Images: From the Fleischner Society 2017; Radiology 2017; 284:228-243. These  results will be called to the ordering clinician or representative by the Radiologist Assistant, and communication documented in the PACS or zVision Dashboard.      CANCER STAGING: Cancer Staging Adenocarcinoma of lung ([place]) Staging form: Lung, AJCC 7th Edition - Clinical: Stage IA (T1a, N0, M0) - Signed by Baird Cancer, PA-C on 02/06/2014    INTERVAL HISTORY:  Marissa [name] 53 y.o. female presents today for follow-up.  She reports overall doing well.  She denies any significant fatigue.  She was recently seen in the emergency room for abdominal pain and vomiting.  CT was performed on April 17, 2019 revealed a long segment of ulcerated plaque and loose debris is in the descending thoracic aorta.  She was started on Eliquis and now follows with cardiology.  Also there was a evidence of a slow-growing groundglass opacity in the right lung apex worrisome for adenocarcinoma.  He is here to discuss a treatment plan.  She also reports she continues to smoke 3 to 5 cigarettes a day.  She would like to be restarted on Chantix.   REVIEW OF SYSTEMS:  Review of Systems  All other systems reviewed and are negative.    PAST MEDICAL/SURGICAL HISTORY:  Past Medical History:  Diagnosis Date  . Adenocarcinoma of lung ([place]) 02/06/2014  . Anxiety   . Arthritis   . COPD (chronic obstructive pulmonary disease) ([place])   . Coronary artery disease   . Diabetes mellitus   . GERD (gastroesophageal reflux disease)   . Hypertension   . Lung cancer [facility])    Past Surgical History:  Procedure Laterality Date  . APPENDECTOMY    . CHOLECYSTECTOMY    . LOBECTOMY Left 2006  . NM MYOCAR IMG MI  05/2008   lexiscan -perfusion defect  in anterior myocardium (breast attenuation); remaining myocardium with NO evidence of ischemia or infarct; EF 78%; low risk scan  . OOPHORECTOMY Left   . PARTIAL HYSTERECTOMY  1985  . SALPINGOOPHORECTOMY Left    benign 9lb mass removed and a massive wall of smaller tumors removed   . TRANSTHORACIC ECHOCARDIOGRAM  05/2008   EF =/>55%, mild MR; trace TR     SOCIAL HISTORY:  Social History   Socioeconomic History  . Marital status: Married    Spouse name: Not on file  . Number of children: Not on file  . Years of education: Not on file  . Highest education level: Not on file  Occupational History  . Not on file  Social Needs  . Financial resource strain: Not hard at all  . Food insecurity    Worry: Never true    Inability: Never true  . Transportation needs    Medical: No    Non-medical: No  Tobacco Use  . Smoking status: Current Every Day Smoker    Packs/day: 1.50    Years: 32.00    Pack years: 48.00    Types: Cigarettes  . Smokeless tobacco: Never Used  Substance and Sexual Activity  . Alcohol use: No  . Drug use: No  . Sexual activity: Not on file  Lifestyle  . Physical activity    Days per week: 0 days    Minutes per session: 0 min  . Stress: Not at all  Relationships  . Social connections    Talks on phone: More than three times a week    Gets together: Three times a week    Attends religious service: Never    Active member of club or organization: No    Attends meetings of clubs or organizations: Never    Relationship status: Married  . Intimate partner violence    Fear of current or ex partner: No    Emotionally abused: No    Physically abused: No    Forced sexual activity: No  Other Topics Concern  . Not on file  Social History Narrative  . Not on file    FAMILY HISTORY:  Family History  Problem Relation Age of Onset  . Colon cancer Mother   . Lung cancer Mother   . Heart disease Mother   . Heart disease Father   . Ovarian cancer Sister   . Hepatitis Brother   . Hernia Brother     CURRENT MEDICATIONS:  Outpatient Encounter Medications as of 05/03/2019  Medication Sig  . acetaminophen (TYLENOL) 325 MG tablet Take 2 tablets (650 mg total) by mouth every 6 (six) hours as needed for mild pain (or Fever >/= 101).  Marland Kitchen  albuterol-ipratropium (COMBIVENT) 18-103 MCG/ACT inhaler Inhale 2 puffs into the lungs every 6 (six) hours as needed for wheezing or shortness of breath.  Marland Kitchen apixaban (ELIQUIS) 5 MG TABS tablet Take 1 tablet (5 mg total) by mouth 2 (two) times daily. Start around 05/18/2019 after completing the starter pack  . atorvastatin (LIPITOR) 80 MG tablet Take 1 tablet (80 mg total) by mouth daily at 6 PM.  . busPIRone (BUSPAR) 30 MG tablet Take 15 mg by mouth 2 (two) times a day.   . Eliquis DVT/PE Starter Pack (ELIQUIS STARTER PACK) 5 MG TABS Take as directed on package: start with two-5mg  tablets twice daily for 7 days. On day 8, switch to one-5mg  tablet twice daily.  Marland Kitchen EPIPEN 2-PAK 0.3 MG/0.3ML SOAJ injection Inject 0.3 mg into the  muscle as needed for anaphylaxis. Has available  . esomeprazole (NEXIUM) 40 MG capsule Take 1 capsule (40 mg total) by mouth daily before breakfast.  . ezetimibe (ZETIA) 10 MG tablet Take 10 mg by mouth daily.    . fish oil-omega-3 fatty acids 1000 MG capsule Take 2 g by mouth 2 (two) times a day. 4 tablets daily   . glimepiride (AMARYL) 4 MG tablet Take 1 tablet (4 mg total) by mouth 2 (two) times daily with a meal.  . Insulin Detemir (LEVEMIR) 100 UNIT/ML Pen Inject 20 Units into the skin at bedtime.  Marland Kitchen levothyroxine (SYNTHROID) 50 MCG tablet Take 1 tablet (50 mcg total) by mouth daily.  Marland Kitchen lisinopril (ZESTRIL) 10 MG tablet Take 1 tablet (10 mg total) by mouth daily.  . metFORMIN (GLUCOPHAGE) 500 MG tablet Take 1 tablet (500 mg total) by mouth 2 (two) times daily with a meal.  . nicotine (NICODERM CQ - DOSED IN MG/24 HOURS) 14 mg/24hr patch Place 1 patch (14 mg total) onto the skin daily.  Marland Kitchen oxyCODONE (ROXICODONE) 15 MG immediate release tablet Take 15 mg by mouth every 4 (four) hours as needed.   Marland Kitchen SILENOR 6 MG TABS Take 6 mg by mouth at bedtime.   . vitamin E 100 UNIT capsule Take 100 Units by mouth daily.    Marland Kitchen ALPRAZolam (XANAX) 0.5 MG tablet TK 1-2 TS TID PRN TAPERING USE   . varenicline (CHANTIX STARTING MONTH PAK) 0.5 MG X 11 & 1 MG X 42 tablet Take one 0.5 mg tablet by mouth once daily for 3 days, then increase to one 0.5 mg tablet twice daily for 4 days, then increase to one 1 mg tablet twice daily.   No facility-administered encounter medications on file as of 05/03/2019.     ALLERGIES:  Allergies  Allergen Reactions  . Bee Venom Anaphylaxis  . Poison Oak Extract Anaphylaxis  . Penicillins Swelling and Rash    Did it involve swelling of the face/tongue/throat, SOB, or low BP? Yes-whole body (swelling) Did it involve sudden or severe rash/hives, skin peeling, or any reaction on the inside of your mouth or nose? Yes-Hives Did you need to seek medical attention at a hospital or doctor's office? Yes When did it last happen?Childhood If all above answers are "NO", may proceed with cephalosporin use.     PHYSICAL EXAM:  ECOG Performance status: 1  Vitals:   05/03/19 1123  BP: 104/77  Pulse: 80  Resp: 18  Temp: (!) 97.1 F (36.2 C)  SpO2: 98%   Filed Weights   05/03/19 1123  Weight: 163 lb 3.2 oz (74 kg)    Physical Exam Constitutional:      Appearance: Normal appearance. She is obese.  HENT:     Head: Normocephalic.     Nose: Nose normal.     Mouth/Throat:     Mouth: Mucous membranes are moist.     Pharynx: Oropharynx is clear.  Eyes:     Extraocular Movements: Extraocular movements intact.     Conjunctiva/sclera: Conjunctivae normal.  Cardiovascular:     Rate and Rhythm: Normal rate and regular rhythm.     Pulses: Normal pulses.     Heart sounds: Normal heart sounds.  Pulmonary:     Effort: Pulmonary effort is normal.     Breath sounds: Normal breath sounds.  Abdominal:     General: Bowel sounds are normal.  Musculoskeletal: Normal range of motion.  Skin:    General: Skin is warm and  dry.  Neurological:     General: No focal deficit present.     Mental Status: She is alert and oriented to person, place, and time.   Psychiatric:        Mood and Affect: Mood normal.        Behavior: Behavior normal.      LABORATORY DATA:  I have reviewed the labs as listed.  CBC    Component Value Date/Time   WBC 10.4 04/30/2019 1102   RBC 4.74 04/30/2019 1102   HGB 14.3 04/30/2019 1102   HCT 44.3 04/30/2019 1102   PLT 361 04/30/2019 1102   MCV 93.5 04/30/2019 1102   MCH 30.2 04/30/2019 1102   MCHC 32.3 04/30/2019 1102   RDW 12.1 04/30/2019 1102   LYMPHSABS 2.7 04/30/2019 1102   MONOABS 0.9 04/30/2019 1102   EOSABS 0.3 04/30/2019 1102   BASOSABS 0.1 04/30/2019 1102   CMP Latest Ref Rng & Units 04/30/2019 04/18/2019 04/18/2019  Glucose 70 - 99 mg/dL 261(H) 256(H) 277(H)  BUN 6 - 20 mg/dL 11 11 13   Creatinine 0.44 - 1.00 mg/dL 0.56 0.43(L) 0.56  Sodium 135 - 145 mmol/L 137 135 129(L)  Potassium 3.5 - 5.1 mmol/L 4.2 3.3(L) 3.9  Chloride 98 - 111 mmol/L 101 105 99  CO2 22 - 32 mmol/L 26 21(L) 21(L)  Calcium 8.9 - 10.3 mg/dL 9.2 8.3(L) 8.2(L)  Total Protein 6.5 - 8.1 g/dL 7.2 - -  Total Bilirubin 0.3 - 1.2 mg/dL 0.6 - -  Alkaline Phos 38 - 126 U/L 102 - -  AST 15 - 41 U/L 17 - -  ALT 0 - 44 U/L 26 - -        ASSESSMENT & PLAN:   Adenocarcinoma of lung ([facility]) 1.  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  Reportedly tried Chantix which caused behavioral changes.  Also tried Wellbutrin which made her nauseous.  Reports that she cannot chew Nicorette gum because of her dentures.  We have recommended trying nicotine lozenges. -CT scan of the chest without contrast dated 04/20/2018 which did not show any evidence of recurrence.  There is slight interval increase in size of the groundglass opacity in the right upper lobe sub-solid nodule. - CT chest performed on 04/17/2019 revealed atherosclerosis of the aorta with splenic infract and renal infarct.  There was redemonstration of  right upper lobe groundglass opacity, 1.4 cm in size concerning for adenocarcinoma.  She is currently following up with cardiology for atherosclerosis of the aorta and is currently on Eliquis.  Concerning, groundglass opacity in the right apex have recommended patient proceed with PET CT to determine if nodule is FDG avid. -She will return to clinic once scans are complete.  2.  Tobacco abuse - Patient was counseled on the harmful effects of smoking and was advised to stop smoking.  We have started her on Chantix.  Side effects were discussed in detail with patient.      Orders placed this encounter:  Orders Placed This Encounter  Procedures  . NM PET Image Restag (PS) Skull Base To Thigh     Roger Shelter, [place] [phone]

## 2019-05-03 NOTE — Assessment & Plan Note (Signed)
1.  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  Reportedly tried Chantix which caused behavioral changes.  Also tried Wellbutrin which made her nauseous.  Reports that she cannot chew Nicorette gum because of her dentures.  We have recommended trying nicotine lozenges. -CT scan of the chest without contrast dated 04/20/2018 which did not show any evidence of recurrence.  There is slight interval increase in size of the groundglass opacity in the right upper lobe sub-solid nodule. - CT chest performed on 04/17/2019 revealed atherosclerosis of the aorta with splenic infract and renal infarct.  There was redemonstration of right upper lobe groundglass opacity, 1.4 cm in size concerning for adenocarcinoma.  She is currently following up with cardiology for atherosclerosis of the aorta and is currently on Eliquis.  Concerning, groundglass opacity in the right apex have recommended patient proceed with PET CT to determine if nodule is FDG avid. -She will return to clinic once scans are complete.  2.  Tobacco abuse - Patient was counseled on the harmful effects of smoking and was advised to stop smoking.  We have started her on Chantix.  Side effects were discussed in detail with patient.

## 2019-05-11 ENCOUNTER — Ambulatory Visit (HOSPITAL_COMMUNITY): Payer: Medicare HMO | Admitting: Hematology

## 2019-05-14 ENCOUNTER — Other Ambulatory Visit: Payer: Self-pay

## 2019-05-14 ENCOUNTER — Ambulatory Visit (HOSPITAL_COMMUNITY)
Admission: RE | Admit: 2019-05-14 | Discharge: 2019-05-14 | Disposition: A | Discharge status: Home / Self Care | Payer: Medicare HMO | Source: Ambulatory Visit | Attending: Hematology | Admitting: Hematology

## 2019-05-14 DIAGNOSIS — R911 Solitary pulmonary nodule: Secondary | ICD-10-CM | POA: Diagnosis not present

## 2019-05-14 DIAGNOSIS — C3492 Malignant neoplasm of unspecified part of left bronchus or lung: Secondary | ICD-10-CM | POA: Insufficient documentation

## 2019-05-14 DIAGNOSIS — I739 Peripheral vascular disease, unspecified: Secondary | ICD-10-CM

## 2019-05-14 MED ORDER — FLUDEOXYGLUCOSE F - 18 (FDG) INJECTION
10.0200 | Freq: Once | INTRAVENOUS | Status: AC | PRN
  Administered 2019-05-14: 10.02 via INTRAVENOUS

## 2019-05-16 ENCOUNTER — Other Ambulatory Visit: Payer: Self-pay | Admitting: *Deleted

## 2019-05-16 ENCOUNTER — Encounter: Payer: Self-pay | Admitting: Vascular Surgery

## 2019-05-16 ENCOUNTER — Ambulatory Visit (HOSPITAL_COMMUNITY)
Admission: RE | Admit: 2019-05-16 | Discharge: 2019-05-16 | Disposition: A | Discharge status: Home / Self Care | Payer: Medicare HMO | Source: Ambulatory Visit | Attending: Family | Admitting: Family

## 2019-05-16 ENCOUNTER — Ambulatory Visit: Payer: Medicare HMO | Admitting: Vascular Surgery

## 2019-05-16 ENCOUNTER — Encounter: Payer: Self-pay | Admitting: *Deleted

## 2019-05-16 ENCOUNTER — Other Ambulatory Visit: Payer: Self-pay

## 2019-05-16 VITALS — BP 112/83 | HR 94 | Temp 97.5°F | Resp 20 | Ht 65.0 in | Wt 158.0 lb

## 2019-05-16 DIAGNOSIS — I739 Peripheral vascular disease, unspecified: Secondary | ICD-10-CM | POA: Diagnosis not present

## 2019-05-16 DIAGNOSIS — I700 Atherosclerosis of aorta: Secondary | ICD-10-CM

## 2019-05-16 MED ORDER — APIXABAN 5 MG PO TABS: 5.0000 mg | ORAL_TABLET | Freq: Two times a day (BID) | ORAL | 1 refills | Status: DC

## 2019-05-16 MED ORDER — APIXABAN 2.5 MG PO TABS: 5.0000 mg | ORAL_TABLET | Freq: Two times a day (BID) | ORAL | Status: DC

## 2019-05-16 NOTE — Progress Notes (Signed)
REASON FOR CONSULT:    Ulcerated plaque of descending thoracic aorta.  The consult was requested by the [facility] emergency department.  ASSESSMENT & PLAN:   ULCERATED PLAQUE DESCENDING THORACIC AORTA: This patient has an ulcerated plaque of the descending thoracic aorta extending over approximately 6 cm.  This appears to have embolized to her left kidney and spleen.  She has been on Eliquis now has had no new symptoms.  She denies abdominal pain, nausea, vomiting, hematuria.  I explained that without addressing this she is at risk for embolization to her lower extremities, mesenteric vessels, and renal arteries.  For this reason I recommended that she undergo thoracic stent placement to address this pathology.  I have discussed the indications for the procedure and the potential complications.  She does have calcific disease in her iliac vessels and we have discussed the potential risk of injury to these vessels with placement of a stent.  We have also discussed the need for continued follow-up, and the risk of embolization.  All of her questions were answered and her procedure has been scheduled for 06/04/2019.  I have refilled her Eliquis so that she can continue this week up until the procedure.  We will stop it 48 hours prior to the procedure.  Deitra Mayo, MD, FACS Beeper [phone] Office: [phone]   HPI:   Marissa [name] is a pleasant 53 y.o. female, who would presented with abdominal pain to the [facility] emergency department.  This prompted a CT of the chest and abdomen.  She was found to have an ulcerated plaque in the descending thoracic aorta and was sent for vascular consultation.  The patient had presented with nausea and vomiting was felt to have diabetic ketoacidosis although she tells me this was ruled out.  She does have type 2 diabetes.  She denies any abdominal pain, flank pain, nausea, vomiting, or hematuria.  She has been on Eliquis.  Her risk factors for  peripheral vascular disease include diabetes, hypertension, hypercholesterolemia, and tobacco use.  She had smoked 3 packs/day for many years but is now cut back to 10 cigarettes a day.  He denies any family history of premature cardiovascular disease.  Past Medical History:  Diagnosis Date  . Adenocarcinoma of lung ([place]) 02/06/2014  . Anxiety   . Arthritis   . COPD (chronic obstructive pulmonary disease) ([place])   . Coronary artery disease   . Diabetes mellitus   . GERD (gastroesophageal reflux disease)   . Hypertension   . Lung cancer ([place])     Family History  Problem Relation Age of Onset  . Colon cancer Mother   . Lung cancer Mother   . Heart disease Mother   . Heart disease Father   . Ovarian cancer Sister   . Hepatitis Brother   . Hernia Brother     SOCIAL HISTORY: Social History   Socioeconomic History  . Marital status: Married    Spouse name: Not on file  . Number of children: Not on file  . Years of education: Not on file  . Highest education level: Not on file  Occupational History  . Not on file  Social Needs  . Financial resource strain: Not hard at all  . Food insecurity    Worry: Never true    Inability: Never true  . Transportation needs    Medical: No    Non-medical: No  Tobacco Use  . Smoking status: Current Every Day Smoker    Packs/day:  0.50    Years: 32.00    Pack years: 16.00    Types: Cigarettes  . Smokeless tobacco: Never Used  Substance and Sexual Activity  . Alcohol use: No  . Drug use: No  . Sexual activity: Not on file  Lifestyle  . Physical activity    Days per week: 0 days    Minutes per session: 0 min  . Stress: Not at all  Relationships  . Social connections    Talks on phone: More than three times a week    Gets together: Three times a week    Attends religious service: Never    Active member of club or organization: No    Attends meetings of clubs or organizations: Never    Relationship status: Married  . Intimate  partner violence    Fear of current or ex partner: No    Emotionally abused: No    Physically abused: No    Forced sexual activity: No  Other Topics Concern  . Not on file  Social History Narrative  . Not on file    Allergies  Allergen Reactions  . Bee Venom Anaphylaxis  . Poison Oak Extract Anaphylaxis  . Penicillins Swelling and Rash    Did it involve swelling of the face/tongue/throat, SOB, or low BP? Yes-whole body (swelling) Did it involve sudden or severe rash/hives, skin peeling, or any reaction on the inside of your mouth or nose? Yes-Hives Did you need to seek medical attention at a hospital or doctor's office? Yes When did it last happen?Childhood If all above answers are "NO", may proceed with cephalosporin use.    Current Outpatient Medications  Medication Sig Dispense Refill  . acetaminophen (TYLENOL) 325 MG tablet Take 2 tablets (650 mg total) by mouth every 6 (six) hours as needed for mild pain (or Fever >/= 101). 12 tablet 0  . albuterol-ipratropium (COMBIVENT) 18-103 MCG/ACT inhaler Inhale 2 puffs into the lungs every 6 (six) hours as needed for wheezing or shortness of breath. 1 Inhaler 1  . ALPRAZolam (XANAX) 0.5 MG tablet TK 1-2 TS TID PRN TAPERING USE    . apixaban (ELIQUIS) 5 MG TABS tablet Take 1 tablet (5 mg total) by mouth 2 (two) times daily. Start around 05/18/2019 after completing the starter pack 60 tablet 5  . atorvastatin (LIPITOR) 80 MG tablet Take 1 tablet (80 mg total) by mouth daily at 6 PM. 30 tablet 6  . busPIRone (BUSPAR) 30 MG tablet Take 15 mg by mouth 2 (two) times a day.     . Continuous Blood Gluc Receiver (FREESTYLE LIBRE 14 DAY READER) DEVI See admin instructions.    . Continuous Blood Gluc Sensor (FREESTYLE LIBRE 14 DAY SENSOR) MISC REPLACE EVERY 14 DAYS    . Eliquis DVT/PE Starter Pack (ELIQUIS STARTER PACK) 5 MG TABS Take as directed on package: start with two-5mg  tablets twice daily for 7 days. On day 8, switch to one-5mg  tablet  twice daily. 1 each 0  . EPIPEN 2-PAK 0.3 MG/0.3ML SOAJ injection Inject 0.3 mg into the muscle as needed for anaphylaxis. Has available  1  . esomeprazole (NEXIUM) 40 MG capsule Take 1 capsule (40 mg total) by mouth daily before breakfast. 30 capsule 3  . ezetimibe (ZETIA) 10 MG tablet Take 10 mg by mouth daily.      . fish oil-omega-3 fatty acids 1000 MG capsule Take 2 g by mouth 2 (two) times a day. 4 tablets daily     . glimepiride (AMARYL)  4 MG tablet Take 1 tablet (4 mg total) by mouth 2 (two) times daily with a meal. 60 tablet 4  . Insulin Detemir (LEVEMIR) 100 UNIT/ML Pen Inject 20 Units into the skin at bedtime. 15 mL 11  . levothyroxine (SYNTHROID) 50 MCG tablet Take 1 tablet (50 mcg total) by mouth daily. 30 tablet 3  . lisinopril (ZESTRIL) 10 MG tablet Take 1 tablet (10 mg total) by mouth daily. 30 tablet 5  . metFORMIN (GLUCOPHAGE) 500 MG tablet Take 1 tablet (500 mg total) by mouth 2 (two) times daily with a meal. 60 tablet 11  . oxyCODONE (ROXICODONE) 15 MG immediate release tablet Take 15 mg by mouth every 4 (four) hours as needed.     Marland Kitchen SILENOR 6 MG TABS Take 6 mg by mouth at bedtime.   2  . varenicline (CHANTIX STARTING MONTH PAK) 0.5 MG X 11 & 1 MG X 42 tablet Take one 0.5 mg tablet by mouth once daily for 3 days, then increase to one 0.5 mg tablet twice daily for 4 days, then increase to one 1 mg tablet twice daily. 53 tablet 0  . vitamin E 100 UNIT capsule Take 100 Units by mouth daily.       No current facility-administered medications for this visit.     REVIEW OF SYSTEMS:  [X]  denotes positive finding, [ ]  denotes negative finding Cardiac  Comments:  Chest pain or chest pressure:    Shortness of breath upon exertion:    Short of breath when lying flat:    Irregular heart rhythm:        Vascular    Pain in calf, thigh, or hip brought on by ambulation: x   Pain in feet at night that wakes you up from your sleep:     Blood clot in your veins:    Leg swelling:          Pulmonary    Oxygen at home:    Productive cough:     Wheezing:         Neurologic    Sudden weakness in arms or legs:     Sudden numbness in arms or legs:     Sudden onset of difficulty speaking or slurred speech:    Temporary loss of vision in one eye:     Problems with dizziness:         Gastrointestinal    Blood in stool:     Vomited blood:         Genitourinary    Burning when urinating:     Blood in urine:        Psychiatric    Major depression:         Hematologic    Bleeding problems:    Problems with blood clotting too easily:        Skin    Rashes or ulcers:        Constitutional    Fever or chills:     PHYSICAL EXAM:   Vitals:   05/16/19 1109  BP: 112/83  Pulse: 94  Resp: 20  Temp: (!) 97.5 F (36.4 C)  TempSrc: Temporal  SpO2: 95%  Weight: 158 lb (71.7 kg)  Height: 5\' 5"  (1.651 m)    GENERAL: The patient is a well-nourished female, in no acute distress. The vital signs are documented above. CARDIAC: There is a regular rate and rhythm.  VASCULAR: I do not detect carotid bruits. On the right side she has a  palpable radial, femoral, popliteal, and dorsalis pedis pulse. On the left side she has a palpable radial, femoral, popliteal, dorsalis pedis, and posterior tibial pulse. PULMONARY: There is good air exchange bilaterally without wheezing or rales. ABDOMEN: Soft and non-tender with normal pitched bowel sounds.  MUSCULOSKELETAL: There are no major deformities or cyanosis. NEUROLOGIC: No focal weakness or paresthesias are detected. SKIN: There are no ulcers or rashes noted. PSYCHIATRIC: The patient has a normal affect.  DATA:    CT CHEST: The patient has a long segment of ulcerated plaque in the descending thoracic aorta without obvious dissection.  Patient has splenic and lower pole left renal infarcts which are likely secondary to this proximal lesion.  ARTERIAL DOPPLER STUDY: I have independently interpreted her arterial Doppler study  today.  On the right side there is a triphasic dorsalis pedis and posterior tibial signal.  ABI is 100%.  Toe pressures 105 mmHg.  On the left side there is a triphasic dorsalis pedis and posterior tibial signal.  ABIs 100%.  Toe pressures 55 mmHg.  LABS: Her GFR is greater than 60.  Creatinine is 0.56.  Hemoglobin 14.3.  Platelets 361,000.

## 2019-05-16 NOTE — H&P (View-Only) (Signed)
REASON FOR CONSULT:    Ulcerated plaque of descending thoracic aorta.  The consult was requested by the [facility] emergency department.  ASSESSMENT & PLAN:   ULCERATED PLAQUE DESCENDING THORACIC AORTA: This patient has an ulcerated plaque of the descending thoracic aorta extending over approximately 6 cm.  This appears to have embolized to her left kidney and spleen.  She has been on Eliquis now has had no new symptoms.  She denies abdominal pain, nausea, vomiting, hematuria.  I explained that without addressing this she is at risk for embolization to her lower extremities, mesenteric vessels, and renal arteries.  For this reason I recommended that she undergo thoracic stent placement to address this pathology.  I have discussed the indications for the procedure and the potential complications.  She does have calcific disease in her iliac vessels and we have discussed the potential risk of injury to these vessels with placement of a stent.  We have also discussed the need for continued follow-up, and the risk of embolization.  All of her questions were answered and her procedure has been scheduled for 06/04/2019.  I have refilled her Eliquis so that she can continue this week up until the procedure.  We will stop it 48 hours prior to the procedure.  Deitra Mayo, MD, FACS Beeper [phone] Office: [phone]   HPI:   Marissa [name] is a pleasant 53 y.o. female, who would presented with abdominal pain to the [facility] emergency department.  This prompted a CT of the chest and abdomen.  She was found to have an ulcerated plaque in the descending thoracic aorta and was sent for vascular consultation.  The patient had presented with nausea and vomiting was felt to have diabetic ketoacidosis although she tells me this was ruled out.  She does have type 2 diabetes.  She denies any abdominal pain, flank pain, nausea, vomiting, or hematuria.  She has been on Eliquis.  Her risk factors for  peripheral vascular disease include diabetes, hypertension, hypercholesterolemia, and tobacco use.  She had smoked 3 packs/day for many years but is now cut back to 10 cigarettes a day.  He denies any family history of premature cardiovascular disease.  Past Medical History:  Diagnosis Date  . Adenocarcinoma of lung ([place]) 02/06/2014  . Anxiety   . Arthritis   . COPD (chronic obstructive pulmonary disease) ([place])   . Coronary artery disease   . Diabetes mellitus   . GERD (gastroesophageal reflux disease)   . Hypertension   . Lung cancer ([place])     Family History  Problem Relation Age of Onset  . Colon cancer Mother   . Lung cancer Mother   . Heart disease Mother   . Heart disease Father   . Ovarian cancer Sister   . Hepatitis Brother   . Hernia Brother     SOCIAL HISTORY: Social History   Socioeconomic History  . Marital status: Married    Spouse name: Not on file  . Number of children: Not on file  . Years of education: Not on file  . Highest education level: Not on file  Occupational History  . Not on file  Social Needs  . Financial resource strain: Not hard at all  . Food insecurity    Worry: Never true    Inability: Never true  . Transportation needs    Medical: No    Non-medical: No  Tobacco Use  . Smoking status: Current Every Day Smoker    Packs/day:  0.50    Years: 32.00    Pack years: 16.00    Types: Cigarettes  . Smokeless tobacco: Never Used  Substance and Sexual Activity  . Alcohol use: No  . Drug use: No  . Sexual activity: Not on file  Lifestyle  . Physical activity    Days per week: 0 days    Minutes per session: 0 min  . Stress: Not at all  Relationships  . Social connections    Talks on phone: More than three times a week    Gets together: Three times a week    Attends religious service: Never    Active member of club or organization: No    Attends meetings of clubs or organizations: Never    Relationship status: Married  . Intimate  partner violence    Fear of current or ex partner: No    Emotionally abused: No    Physically abused: No    Forced sexual activity: No  Other Topics Concern  . Not on file  Social History Narrative  . Not on file    Allergies  Allergen Reactions  . Bee Venom Anaphylaxis  . Poison Oak Extract Anaphylaxis  . Penicillins Swelling and Rash    Did it involve swelling of the face/tongue/throat, SOB, or low BP? Yes-whole body (swelling) Did it involve sudden or severe rash/hives, skin peeling, or any reaction on the inside of your mouth or nose? Yes-Hives Did you need to seek medical attention at a hospital or doctor's office? Yes When did it last happen?Childhood If all above answers are "NO", may proceed with cephalosporin use.    Current Outpatient Medications  Medication Sig Dispense Refill  . acetaminophen (TYLENOL) 325 MG tablet Take 2 tablets (650 mg total) by mouth every 6 (six) hours as needed for mild pain (or Fever >/= 101). 12 tablet 0  . albuterol-ipratropium (COMBIVENT) 18-103 MCG/ACT inhaler Inhale 2 puffs into the lungs every 6 (six) hours as needed for wheezing or shortness of breath. 1 Inhaler 1  . ALPRAZolam (XANAX) 0.5 MG tablet TK 1-2 TS TID PRN TAPERING USE    . apixaban (ELIQUIS) 5 MG TABS tablet Take 1 tablet (5 mg total) by mouth 2 (two) times daily. Start around 05/18/2019 after completing the starter pack 60 tablet 5  . atorvastatin (LIPITOR) 80 MG tablet Take 1 tablet (80 mg total) by mouth daily at 6 PM. 30 tablet 6  . busPIRone (BUSPAR) 30 MG tablet Take 15 mg by mouth 2 (two) times a day.     . Continuous Blood Gluc Receiver (FREESTYLE LIBRE 14 DAY READER) DEVI See admin instructions.    . Continuous Blood Gluc Sensor (FREESTYLE LIBRE 14 DAY SENSOR) MISC REPLACE EVERY 14 DAYS    . Eliquis DVT/PE Starter Pack (ELIQUIS STARTER PACK) 5 MG TABS Take as directed on package: start with two-5mg  tablets twice daily for 7 days. On day 8, switch to one-5mg  tablet  twice daily. 1 each 0  . EPIPEN 2-PAK 0.3 MG/0.3ML SOAJ injection Inject 0.3 mg into the muscle as needed for anaphylaxis. Has available  1  . esomeprazole (NEXIUM) 40 MG capsule Take 1 capsule (40 mg total) by mouth daily before breakfast. 30 capsule 3  . ezetimibe (ZETIA) 10 MG tablet Take 10 mg by mouth daily.      . fish oil-omega-3 fatty acids 1000 MG capsule Take 2 g by mouth 2 (two) times a day. 4 tablets daily     . glimepiride (AMARYL)  4 MG tablet Take 1 tablet (4 mg total) by mouth 2 (two) times daily with a meal. 60 tablet 4  . Insulin Detemir (LEVEMIR) 100 UNIT/ML Pen Inject 20 Units into the skin at bedtime. 15 mL 11  . levothyroxine (SYNTHROID) 50 MCG tablet Take 1 tablet (50 mcg total) by mouth daily. 30 tablet 3  . lisinopril (ZESTRIL) 10 MG tablet Take 1 tablet (10 mg total) by mouth daily. 30 tablet 5  . metFORMIN (GLUCOPHAGE) 500 MG tablet Take 1 tablet (500 mg total) by mouth 2 (two) times daily with a meal. 60 tablet 11  . oxyCODONE (ROXICODONE) 15 MG immediate release tablet Take 15 mg by mouth every 4 (four) hours as needed.     Marland Kitchen SILENOR 6 MG TABS Take 6 mg by mouth at bedtime.   2  . varenicline (CHANTIX STARTING MONTH PAK) 0.5 MG X 11 & 1 MG X 42 tablet Take one 0.5 mg tablet by mouth once daily for 3 days, then increase to one 0.5 mg tablet twice daily for 4 days, then increase to one 1 mg tablet twice daily. 53 tablet 0  . vitamin E 100 UNIT capsule Take 100 Units by mouth daily.       No current facility-administered medications for this visit.     REVIEW OF SYSTEMS:  [X]  denotes positive finding, [ ]  denotes negative finding Cardiac  Comments:  Chest pain or chest pressure:    Shortness of breath upon exertion:    Short of breath when lying flat:    Irregular heart rhythm:        Vascular    Pain in calf, thigh, or hip brought on by ambulation: x   Pain in feet at night that wakes you up from your sleep:     Blood clot in your veins:    Leg swelling:          Pulmonary    Oxygen at home:    Productive cough:     Wheezing:         Neurologic    Sudden weakness in arms or legs:     Sudden numbness in arms or legs:     Sudden onset of difficulty speaking or slurred speech:    Temporary loss of vision in one eye:     Problems with dizziness:         Gastrointestinal    Blood in stool:     Vomited blood:         Genitourinary    Burning when urinating:     Blood in urine:        Psychiatric    Major depression:         Hematologic    Bleeding problems:    Problems with blood clotting too easily:        Skin    Rashes or ulcers:        Constitutional    Fever or chills:     PHYSICAL EXAM:   Vitals:   05/16/19 1109  BP: 112/83  Pulse: 94  Resp: 20  Temp: (!) 97.5 F (36.4 C)  TempSrc: Temporal  SpO2: 95%  Weight: 158 lb (71.7 kg)  Height: 5\' 5"  (1.651 m)    GENERAL: The patient is a well-nourished female, in no acute distress. The vital signs are documented above. CARDIAC: There is a regular rate and rhythm.  VASCULAR: I do not detect carotid bruits. On the right side she has a  palpable radial, femoral, popliteal, and dorsalis pedis pulse. On the left side she has a palpable radial, femoral, popliteal, dorsalis pedis, and posterior tibial pulse. PULMONARY: There is good air exchange bilaterally without wheezing or rales. ABDOMEN: Soft and non-tender with normal pitched bowel sounds.  MUSCULOSKELETAL: There are no major deformities or cyanosis. NEUROLOGIC: No focal weakness or paresthesias are detected. SKIN: There are no ulcers or rashes noted. PSYCHIATRIC: The patient has a normal affect.  DATA:    CT CHEST: The patient has a long segment of ulcerated plaque in the descending thoracic aorta without obvious dissection.  Patient has splenic and lower pole left renal infarcts which are likely secondary to this proximal lesion.  ARTERIAL DOPPLER STUDY: I have independently interpreted her arterial Doppler study  today.  On the right side there is a triphasic dorsalis pedis and posterior tibial signal.  ABI is 100%.  Toe pressures 105 mmHg.  On the left side there is a triphasic dorsalis pedis and posterior tibial signal.  ABIs 100%.  Toe pressures 55 mmHg.  LABS: Her GFR is greater than 60.  Creatinine is 0.56.  Hemoglobin 14.3.  Platelets 361,000.

## 2019-05-17 ENCOUNTER — Inpatient Hospital Stay (HOSPITAL_COMMUNITY): Payer: Medicare HMO | Attending: Hematology | Admitting: Hematology

## 2019-05-17 ENCOUNTER — Encounter (HOSPITAL_COMMUNITY): Payer: Self-pay | Admitting: Hematology

## 2019-05-17 VITALS — BP 122/97 | HR 106 | Temp 97.9°F | Resp 18 | Wt 161.1 lb

## 2019-05-17 DIAGNOSIS — F17210 Nicotine dependence, cigarettes, uncomplicated: Secondary | ICD-10-CM | POA: Insufficient documentation

## 2019-05-17 DIAGNOSIS — I700 Atherosclerosis of aorta: Secondary | ICD-10-CM | POA: Insufficient documentation

## 2019-05-17 DIAGNOSIS — R69 Illness, unspecified: Secondary | ICD-10-CM | POA: Diagnosis not present

## 2019-05-17 DIAGNOSIS — R918 Other nonspecific abnormal finding of lung field: Secondary | ICD-10-CM | POA: Insufficient documentation

## 2019-05-17 DIAGNOSIS — Z801 Family history of malignant neoplasm of trachea, bronchus and lung: Secondary | ICD-10-CM | POA: Diagnosis not present

## 2019-05-17 DIAGNOSIS — Z7901 Long term (current) use of anticoagulants: Secondary | ICD-10-CM | POA: Diagnosis not present

## 2019-05-17 DIAGNOSIS — C3492 Malignant neoplasm of unspecified part of left bronchus or lung: Secondary | ICD-10-CM | POA: Diagnosis not present

## 2019-05-17 DIAGNOSIS — Z902 Acquired absence of lung [part of]: Secondary | ICD-10-CM | POA: Diagnosis not present

## 2019-05-17 DIAGNOSIS — Z85118 Personal history of other malignant neoplasm of bronchus and lung: Secondary | ICD-10-CM | POA: Diagnosis not present

## 2019-05-17 DIAGNOSIS — Z800 Family history of malignant neoplasm of digestive organs: Secondary | ICD-10-CM | POA: Insufficient documentation

## 2019-05-17 MED ORDER — VARENICLINE TARTRATE 1 MG PO TABS: 1.0000 mg | ORAL_TABLET | Freq: Two times a day (BID) | ORAL | 1 refills | Status: DC

## 2019-05-17 NOTE — Assessment & Plan Note (Signed)
1.  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  Reportedly tried Chantix which caused behavioral changes.  Also tried Wellbutrin which made her nauseous.  Reports that she cannot chew Nicorette gum because of her dentures.  We have recommended trying nicotine lozenges. -CT scan of the chest without contrast dated 04/20/2018 which did not show any evidence of recurrence.  There is slight interval increase in size of the groundglass opacity in the right upper lobe sub-solid nodule. - CT chest performed on 04/17/2019 revealed atherosclerosis of the aorta with splenic infract and renal infarct.  There was redemonstration of right upper lobe groundglass opacity, 1.4 cm in size concerning for adenocarcinoma.  She is currently following up with cardiology for atherosclerosis of the aorta and is currently on Eliquis.  Concerning, groundglass opacity in the right apex have recommended patient proceed with PET CT to determine if nodule is FDG avid. - PET/CT confirmed apical right upper lobe 1.9 x 1.1 cm groundglass nodule.  The nodule only demonstrated low-level metabolism with a max SUV of 1.0.  There were no hypermetabolic pulmonary findings.  However, indolent primary bronchiogenic adenocarcinoma cannot be excluded.  Recommend patient continue with surveillance CT scans every 6 months. - Patient will return in 6 months.  2.  Tobacco abuse - Patient was counseled on the harmful effects of smoking and was advised to stop smoking.  We have started her on Chantix.  Side effects were discussed in detail with patient. - Patient continues on Chantix.  Patient has stopped smoking 3-4 6 packs of cigarettes a day now down to 10 cigarettes a day.  She plans to complete Chantix until she stops smoking.

## 2019-05-17 NOTE — Progress Notes (Signed)
[place], [place] [address]   CLINIC:  Medical Oncology/Hematology  PCP:  [facility], [place] [address] [phone]   REASON FOR VISIT:  Follow-up for lung cancer  CURRENT THERAPY: Clinical surveillance  BRIEF ONCOLOGIC HISTORY:  Oncology History  Adenocarcinoma of lung ([facility])  03/23/2005 Initial Diagnosis   Adenocarcinoma of lung with left upper lobe wedge resection by Dr. Arlyce Dice measuring 1.4 cm.  0/4 lymph nodes.   01/27/2015 Imaging   CT chest- 1. Stable examination demonstrating multiple tiny 2-3 mm pulmonary nodules in the right upper lobe which are likely benign areas of mucoid impaction in terminal bronchioles, in addition to an unchanged 6 x 5 mm ground-glass attenuation nodule in the apex of the right upper lobe. Continued attention on annual followup examinations is recommended to ensure continued stability. 2. Status post left upper lobectomy. No findings to suggest local recurrence of disease. 3. Atherosclerosis, including three-vessel coronary artery disease. Please note that although the presence of coronary artery calcium documents the presence of coronary artery disease, the severity of this disease and any potential stenosis cannot be assessed on this non-gated CT examination. Assessment for potential risk factor modification, dietary therapy or pharmacologic therapy may be warranted, if clinically indicated.   04/19/2017 Imaging   CT chest- 1. No evidence lung cancer recurrence in the LEFT lower lung. 2. Increase in the ground-glass surrounding a RIGHT upper lobe pulmonary nodule. Minimal size increased over 3 year interval. Recommend follow-up CT in 12 months for part solid nodule. This recommendation follows the consensus statement: Guidelines for Management of Incidental Pulmonary Nodules Detected on CT Images: From the Fleischner Society 2017; Radiology 2017; 284:228-243. These  results will be called to the ordering clinician or representative by the Radiologist Assistant, and communication documented in the PACS or zVision Dashboard.      CANCER STAGING: Cancer Staging Adenocarcinoma of lung ([facility]) Staging form: Lung, AJCC 7th Edition - Clinical: Stage IA (T1a, N0, M0) - Signed by Baird Cancer, PA-C on 02/06/2014    INTERVAL HISTORY:  Marissa [name] 53 y.o. female presents today for follow-up.  Reports overall doing well.  Patient is scheduled for aorta stent placement on August 24.  She states she is actively trying to stop smoking.  She is down to 10 cigarettes a day from 3 to 4 packs a day.  She is taking Chantix.  She denies any chest pain, shortness of breath, lightheadedness or dizziness.  She is here to discuss results of recent PET scan.   REVIEW OF SYSTEMS:  Review of Systems  Constitutional: Negative.   HENT:  Negative.   Eyes: Negative.   Respiratory: Negative.   Cardiovascular: Negative.   Gastrointestinal: Negative.   Endocrine: Negative.   Genitourinary: Negative.    Musculoskeletal: Negative.   Skin: Negative.   Neurological: Negative.   Hematological: Negative.   Psychiatric/Behavioral: Negative.      PAST MEDICAL/SURGICAL HISTORY:  Past Medical History:  Diagnosis Date  . Adenocarcinoma of lung ([facility]) 02/06/2014  . Anxiety   . Arthritis   . COPD (chronic obstructive pulmonary disease) ([facility])   . Coronary artery disease   . Diabetes mellitus   . GERD (gastroesophageal reflux disease)   . Hypertension   . Lung cancer [facility])    Past Surgical History:  Procedure Laterality Date  . APPENDECTOMY    . CHOLECYSTECTOMY    . LOBECTOMY Left 2006  . [facility] IMG MI  05/2008  lexiscan -perfusion defect in anterior myocardium (breast attenuation); remaining myocardium with NO evidence of ischemia or infarct; EF 78%; low risk scan  . OOPHORECTOMY Left   . PARTIAL HYSTERECTOMY  1985  . SALPINGOOPHORECTOMY Left    benign 9lb mass  removed and a massive wall of smaller tumors removed  . TRANSTHORACIC ECHOCARDIOGRAM  05/2008   EF =/>55%, mild MR; trace TR     SOCIAL HISTORY:  Social History   Socioeconomic History  . Marital status: Married    Spouse name: Not on file  . Number of children: Not on file  . Years of education: Not on file  . Highest education level: Not on file  Occupational History  . Not on file  Social Needs  . Financial resource strain: Not hard at all  . Food insecurity    Worry: Never true    Inability: Never true  . Transportation needs    Medical: No    Non-medical: No  Tobacco Use  . Smoking status: Current Every Day Smoker    Packs/day: 0.50    Years: 32.00    Pack years: 16.00    Types: Cigarettes  . Smokeless tobacco: Never Used  Substance and Sexual Activity  . Alcohol use: No  . Drug use: No  . Sexual activity: Not on file  Lifestyle  . Physical activity    Days per week: 0 days    Minutes per session: 0 min  . Stress: Not at all  Relationships  . Social connections    Talks on phone: More than three times a week    Gets together: Three times a week    Attends religious service: Never    Active member of club or organization: No    Attends meetings of clubs or organizations: Never    Relationship status: Married  . Intimate partner violence    Fear of current or ex partner: No    Emotionally abused: No    Physically abused: No    Forced sexual activity: No  Other Topics Concern  . Not on file  Social History Narrative  . Not on file    FAMILY HISTORY:  Family History  Problem Relation Age of Onset  . Colon cancer Mother   . Lung cancer Mother   . Heart disease Mother   . Heart disease Father   . Ovarian cancer Sister   . Hepatitis Brother   . Hernia Brother     CURRENT MEDICATIONS:  Outpatient Encounter Medications as of 05/17/2019  Medication Sig  . acetaminophen (TYLENOL) 325 MG tablet Take 2 tablets (650 mg total) by mouth every 6 (six) hours  as needed for mild pain (or Fever >/= 101).  Marland Kitchen albuterol-ipratropium (COMBIVENT) 18-103 MCG/ACT inhaler Inhale 2 puffs into the lungs every 6 (six) hours as needed for wheezing or shortness of breath.  . ALPRAZolam (XANAX) 0.5 MG tablet TK 1-2 TS TID PRN TAPERING USE  . apixaban (ELIQUIS) 5 MG TABS tablet Take 1 tablet (5 mg total) by mouth 2 (two) times daily.  Marland Kitchen atorvastatin (LIPITOR) 80 MG tablet Take 1 tablet (80 mg total) by mouth daily at 6 PM.  . busPIRone (BUSPAR) 30 MG tablet Take 15 mg by mouth 2 (two) times a day.   . Continuous Blood Gluc Receiver (FREESTYLE LIBRE 14 DAY READER) DEVI See admin instructions.  . Continuous Blood Gluc Sensor (FREESTYLE LIBRE 14 DAY SENSOR) MISC REPLACE EVERY 14 DAYS  . EPIPEN 2-PAK 0.3 MG/0.3ML SOAJ injection  Inject 0.3 mg into the muscle as needed for anaphylaxis. Has available  . esomeprazole (NEXIUM) 40 MG capsule Take 1 capsule (40 mg total) by mouth daily before breakfast.  . ezetimibe (ZETIA) 10 MG tablet Take 10 mg by mouth daily.    . fish oil-omega-3 fatty acids 1000 MG capsule Take 2 g by mouth 2 (two) times a day. 4 tablets daily   . glimepiride (AMARYL) 4 MG tablet Take 1 tablet (4 mg total) by mouth 2 (two) times daily with a meal.  . Insulin Detemir (LEVEMIR) 100 UNIT/ML Pen Inject 20 Units into the skin at bedtime.  Marland Kitchen levothyroxine (SYNTHROID) 50 MCG tablet Take 1 tablet (50 mcg total) by mouth daily.  Marland Kitchen lisinopril (ZESTRIL) 10 MG tablet Take 1 tablet (10 mg total) by mouth daily.  . metFORMIN (GLUCOPHAGE) 500 MG tablet Take 1 tablet (500 mg total) by mouth 2 (two) times daily with a meal.  . oxyCODONE (ROXICODONE) 15 MG immediate release tablet Take 15 mg by mouth every 4 (four) hours as needed.   Marland Kitchen SILENOR 6 MG TABS Take 6 mg by mouth at bedtime.   . varenicline (CHANTIX STARTING MONTH PAK) 0.5 MG X 11 & 1 MG X 42 tablet Take one 0.5 mg tablet by mouth once daily for 3 days, then increase to one 0.5 mg tablet twice daily for 4 days, then  increase to one 1 mg tablet twice daily.  . vitamin E 100 UNIT capsule Take 100 Units by mouth daily.     No facility-administered encounter medications on file as of 05/17/2019.     ALLERGIES:  Allergies  Allergen Reactions  . Bee Venom Anaphylaxis  . Poison Oak Extract Anaphylaxis  . Penicillins Swelling and Rash    Did it involve swelling of the face/tongue/throat, SOB, or low BP? Yes-whole body (swelling) Did it involve sudden or severe rash/hives, skin peeling, or any reaction on the inside of your mouth or nose? Yes-Hives Did you need to seek medical attention at a hospital or doctor's office? Yes When did it last happen?Childhood If all above answers are "NO", may proceed with cephalosporin use.     PHYSICAL EXAM:  ECOG Performance status: 1  Vitals:   05/17/19 1300  BP: (!) 122/97  Pulse: (!) 106  Resp: 18  Temp: 97.9 F (36.6 C)  SpO2: 97%   Filed Weights   05/17/19 1300  Weight: 161 lb 2 oz (73.1 kg)    Physical Exam Constitutional:      Appearance: Normal appearance. She is obese.  HENT:     Head: Normocephalic.     Right Ear: External ear normal.     Left Ear: External ear normal.     Nose: Nose normal.     Mouth/Throat:     Mouth: Mucous membranes are moist.     Pharynx: Oropharynx is clear.  Eyes:     Extraocular Movements: Extraocular movements intact.     Conjunctiva/sclera: Conjunctivae normal.  Neck:     Musculoskeletal: Normal range of motion.  Cardiovascular:     Rate and Rhythm: Regular rhythm. Tachycardia present.     Pulses: Normal pulses.     Heart sounds: Normal heart sounds.  Pulmonary:     Effort: Pulmonary effort is normal.     Breath sounds: Normal breath sounds.  Abdominal:     General: Bowel sounds are normal.     Palpations: Abdomen is soft.  Musculoskeletal: Normal range of motion.  Skin:  General: Skin is warm and dry.  Neurological:     General: No focal deficit present.     Mental Status: She is alert and  oriented to person, place, and time.  Psychiatric:        Mood and Affect: Mood normal.        Behavior: Behavior normal.        Thought Content: Thought content normal.        Judgment: Judgment normal.      LABORATORY DATA:  I have reviewed the labs as listed.  CBC    Component Value Date/Time   WBC 10.4 04/30/2019 1102   RBC 4.74 04/30/2019 1102   HGB 14.3 04/30/2019 1102   HCT 44.3 04/30/2019 1102   PLT 361 04/30/2019 1102   MCV 93.5 04/30/2019 1102   MCH 30.2 04/30/2019 1102   MCHC 32.3 04/30/2019 1102   RDW 12.1 04/30/2019 1102   LYMPHSABS 2.7 04/30/2019 1102   MONOABS 0.9 04/30/2019 1102   EOSABS 0.3 04/30/2019 1102   BASOSABS 0.1 04/30/2019 1102   CMP Latest Ref Rng & Units 04/30/2019 04/18/2019 04/18/2019  Glucose 70 - 99 mg/dL 261(H) 256(H) 277(H)  BUN 6 - 20 mg/dL 11 11 13   Creatinine 0.44 - 1.00 mg/dL 0.56 0.43(L) 0.56  Sodium 135 - 145 mmol/L 137 135 129(L)  Potassium 3.5 - 5.1 mmol/L 4.2 3.3(L) 3.9  Chloride 98 - 111 mmol/L 101 105 99  CO2 22 - 32 mmol/L 26 21(L) 21(L)  Calcium 8.9 - 10.3 mg/dL 9.2 8.3(L) 8.2(L)  Total Protein 6.5 - 8.1 g/dL 7.2 - -  Total Bilirubin 0.3 - 1.2 mg/dL 0.6 - -  Alkaline Phos 38 - 126 U/L 102 - -  AST 15 - 41 U/L 17 - -  ALT 0 - 44 U/L 26 - -         ASSESSMENT & PLAN:   Adenocarcinoma of lung ([facility]) 1.  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  Reportedly tried Chantix which caused behavioral changes.  Also tried Wellbutrin which made her nauseous.  Reports that she cannot chew Nicorette gum because of her dentures.  We have recommended trying nicotine lozenges. -CT scan of the chest without contrast dated 04/20/2018 which did not show any evidence of recurrence.  There is slight interval increase in size of the groundglass opacity in the right upper lobe sub-solid nodule. - CT chest performed  on 04/17/2019 revealed atherosclerosis of the aorta with splenic infract and renal infarct.  There was redemonstration of right upper lobe groundglass opacity, 1.4 cm in size concerning for adenocarcinoma.  She is currently following up with cardiology for atherosclerosis of the aorta and is currently on Eliquis.  Concerning, groundglass opacity in the right apex have recommended patient proceed with PET CT to determine if nodule is FDG avid. - PET/CT confirmed apical right upper lobe 1.9 x 1.1 cm groundglass nodule.  The nodule only demonstrated low-level metabolism with a max SUV of 1.0.  There were no hypermetabolic pulmonary findings.  However, indolent primary bronchiogenic adenocarcinoma cannot be excluded.  Recommend patient continue with surveillance CT scans every 6 months. - Patient will return in 6 months.  2.  Tobacco abuse - Patient was counseled on the harmful effects of smoking and was advised to stop smoking.  We have started her on Chantix.  Side effects were discussed in detail with  patient. - Patient continues on Chantix.  Patient has stopped smoking 3-4 6 packs of cigarettes a day now down to 10 cigarettes a day.  She plans to complete Chantix until she stops smoking.      Orders placed this encounter:  Orders Placed This Encounter  Procedures  . CT Chest W Contrast  . CBC with Differential  . Comprehensive metabolic panel      Roger Shelter, [place] [phone]

## 2019-05-28 NOTE — Pre-Procedure Instructions (Signed)
[facility] [phone] - [place], [place] AT [place] [address] [place] [address] Phone: [phone] Fax: [phone]      Your procedure is scheduled on Monday August 24th.  Report to [facility] Main Entrance "A" at 5:30 A.M., and check in at the Admitting office.  Call this number if you have problems the morning of surgery:  [phone]  Call [phone] if you have any questions prior to your surgery date Monday-Friday 8am-4pm    Remember:  Do not eat or drink after midnight the night before your surgery    Take these medicines the morning of surgery with A SIP OF WATER  busPIRone (BUSPAR)  esomeprazole (NEXIUM) levothyroxine (SYNTHROID)  ezetimibe (ZETIA) albuterol-ipratropium (COMBIVENT) if needed ALPRAZolam Duanne Moron) if needed oxyCODONE (ROXICODONE) if needed   [place]  Why is it important to control my blood sugar before and after surgery? . Improving blood sugar levels before and after surgery helps healing and can limit problems. . A way of improving blood sugar control is eating a healthy diet by: o  Eating less sugar and carbohydrates o  Increasing activity/exercise o  Talking with your doctor about reaching your blood sugar goals . High blood sugars (greater than 180 mg/dL) can raise your risk of infections and slow your recovery, so you will need to focus on controlling your diabetes during the weeks before surgery. . Make sure that the doctor who takes care of your diabetes knows about your planned surgery including the date and location.  How do I manage my blood sugar before surgery? . Check your blood sugar at least 4 times a day, starting 2 days before surgery, to make sure that the level is not too high or low. o Check your blood sugar the morning of your surgery when you wake up and every 2 hours until you get to the Short Stay unit. . If your blood  sugar is less than 70 mg/dL, you will need to treat for low blood sugar: o Do not take insulin. o Treat a low blood sugar (less than 70 mg/dL) with  cup of clear juice (cranberry or apple), 4 glucose tablets, OR glucose gel. Recheck blood sugar in 15 minutes after treatment (to make sure it is greater than 70 mg/dL). If your blood sugar is not greater than 70 mg/dL on recheck, call [phone] o  for further instructions. . Report your blood sugar to the short stay nurse when you get to Short Stay.  . If you are admitted to the hospital after surgery: o Your blood sugar will be checked by the staff and you will probably be given insulin after surgery (instead of oral diabetes medicines) to make sure you have good blood sugar levels. o The goal for blood sugar control after surgery is 80-180 mg/dL.     WHAT DO I DO ABOUT MY DIABETES MEDICATION?   Marland Kitchen Do not take oral diabetes medicines (pills): glimepiride (AMARYL) or metFORMIN (GLUCOPHAGE) the morning of surgery.  . THE NIGHT BEFORE SURGERY, take 10 units of Insulin Detemir (LEVEMIR) insulin       Follow your surgeon's instructions on when to stop Asprin and apixaban (ELIQUIS).  If no instructions were given by your surgeon then you will need to call the office to get those instructions.     7 days prior to surgery STOP taking any Aspirin (unless otherwise instructed by your surgeon),  Aleve, Naproxen, Ibuprofen, Motrin, Advil, Goody's, BC's, all herbal medications, fish oil, and all vitamins.    The Morning of Surgery  Do not wear jewelry, make-up or nail polish.  Do not wear lotions, powders, or perfumes/colognes, or deodorant  Do not shave 48 hours prior to surgery.  Men may shave face and neck.  Do not bring valuables to the hospital.  [facility] is not responsible for any belongings or valuables.  If you are a smoker, DO NOT Smoke 24 hours prior to surgery IF you wear a CPAP at night please bring your mask, tubing, and machine  the morning of surgery   Remember that you must have someone to transport you home after your surgery, and remain with you for 24 hours if you are discharged the same day.   Contacts, glasses, hearing aids, dentures or bridgework may not be worn into surgery.    Leave your suitcase in the car.  After surgery it may be brought to your room.  For patients admitted to the hospital, discharge time will be determined by your treatment team.  Patients discharged the day of surgery will not be allowed to drive home.    Special instructions:   [facility]- Preparing For Surgery  Before surgery, you can play an important role. Because skin is not sterile, your skin needs to be as free of germs as possible. You can reduce the number of germs on your skin by washing with CHG (chlorahexidine gluconate) Soap before surgery.  CHG is an antiseptic cleaner which kills germs and bonds with the skin to continue killing germs even after washing.    Oral Hygiene is also important to reduce your risk of infection.  Remember - BRUSH YOUR TEETH THE MORNING OF SURGERY WITH YOUR REGULAR TOOTHPASTE  Please do not use if you have an allergy to CHG or antibacterial soaps. If your skin becomes reddened/irritated stop using the CHG.  Do not shave (including legs and underarms) for at least 48 hours prior to first CHG shower. It is OK to shave your face.  Please follow these instructions carefully.   1. Shower the NIGHT BEFORE SURGERY and the MORNING OF SURGERY with CHG Soap.   2. If you chose to wash your hair, wash your hair first as usual with your normal shampoo.  3. After you shampoo, rinse your hair and body thoroughly to remove the shampoo.  4. Use CHG as you would any other liquid soap. You can apply CHG directly to the skin and wash gently with a scrungie or a clean washcloth.   5. Apply the CHG Soap to your body ONLY FROM THE NECK DOWN.  Do not use on open wounds or open sores. Avoid contact with your  eyes, ears, mouth and genitals (private parts). Wash Face and genitals (private parts)  with your normal soap.   6. Wash thoroughly, paying special attention to the area where your surgery will be performed.  7. Thoroughly rinse your body with warm water from the neck down.  8. DO NOT shower/wash with your normal soap after using and rinsing off the CHG Soap.  9. Pat yourself dry with a CLEAN TOWEL.  10. Wear CLEAN PAJAMAS to bed the night before surgery, wear comfortable clothes the morning of surgery  11. Place CLEAN SHEETS on your bed the night of your first shower and DO NOT SLEEP WITH PETS.    Day of Surgery:  Do not apply any deodorants/lotions. Please shower the morning  of surgery with the CHG soap  Please wear clean clothes to the hospital/surgery center.   Remember to brush your teeth WITH YOUR REGULAR TOOTHPASTE.   Please read over the following fact sheets that you were given.

## 2019-05-29 ENCOUNTER — Encounter (HOSPITAL_COMMUNITY)
Admission: RE | Admit: 2019-05-29 | Discharge: 2019-05-29 | Disposition: A | Discharge status: Home / Self Care | Payer: Medicare HMO | Source: Ambulatory Visit | Attending: Vascular Surgery | Admitting: Vascular Surgery

## 2019-05-29 ENCOUNTER — Encounter (HOSPITAL_COMMUNITY): Payer: Self-pay

## 2019-05-29 ENCOUNTER — Other Ambulatory Visit: Payer: Self-pay

## 2019-05-29 DIAGNOSIS — Z01812 Encounter for preprocedural laboratory examination: Secondary | ICD-10-CM | POA: Diagnosis not present

## 2019-05-29 DIAGNOSIS — E1140 Type 2 diabetes mellitus with diabetic neuropathy, unspecified: Secondary | ICD-10-CM | POA: Diagnosis not present

## 2019-05-29 DIAGNOSIS — Z20828 Contact with and (suspected) exposure to other viral communicable diseases: Secondary | ICD-10-CM | POA: Insufficient documentation

## 2019-05-29 HISTORY — DX: Hypothyroidism, unspecified: E03.9

## 2019-05-29 LAB — URINALYSIS, ROUTINE W REFLEX MICROSCOPIC
Bilirubin Urine: NEGATIVE
Glucose, UA: NEGATIVE mg/dL
Hgb urine dipstick: NEGATIVE
Ketones, ur: NEGATIVE mg/dL
Leukocytes,Ua: NEGATIVE
Nitrite: NEGATIVE
Protein, ur: NEGATIVE mg/dL
Specific Gravity, Urine: 1.018 (ref 1.005–1.030)
pH: 5 (ref 5.0–8.0)

## 2019-05-29 LAB — CBC
HCT: 43.5 % (ref 36.0–46.0)
Hemoglobin: 14.1 g/dL (ref 12.0–15.0)
MCH: 29.7 pg (ref 26.0–34.0)
MCHC: 32.4 g/dL (ref 30.0–36.0)
MCV: 91.8 fL (ref 80.0–100.0)
Platelets: 353 10*3/uL (ref 150–400)
RBC: 4.74 MIL/uL (ref 3.87–5.11)
RDW: 12.4 % (ref 11.5–15.5)
WBC: 14.6 10*3/uL — ABNORMAL HIGH (ref 4.0–10.5)
nRBC: 0 % (ref 0.0–0.2)

## 2019-05-29 LAB — COMPREHENSIVE METABOLIC PANEL
ALT: 28 U/L (ref 0–44)
AST: 21 U/L (ref 15–41)
Albumin: 4.1 g/dL (ref 3.5–5.0)
Alkaline Phosphatase: 89 U/L (ref 38–126)
Anion gap: 14 (ref 5–15)
BUN: 14 mg/dL (ref 6–20)
CO2: 18 mmol/L — ABNORMAL LOW (ref 22–32)
Calcium: 9.9 mg/dL (ref 8.9–10.3)
Chloride: 105 mmol/L (ref 98–111)
Creatinine, Ser: 0.64 mg/dL (ref 0.44–1.00)
GFR calc Af Amer: >60 mL/min (ref >60)
GFR calc non Af Amer: >60 mL/min (ref >60)
Glucose, Bld: 87 mg/dL (ref 70–99)
Potassium: 4.3 mmol/L (ref 3.5–5.1)
Sodium: 137 mmol/L (ref 135–145)
Total Bilirubin: 0.2 mg/dL — ABNORMAL LOW (ref 0.3–1.2)
Total Protein: 6.8 g/dL (ref 6.5–8.1)

## 2019-05-29 LAB — TYPE AND SCREEN
ABO/RH(D): B POS
Antibody Screen: NEGATIVE

## 2019-05-29 LAB — BLOOD GAS, ARTERIAL
Acid-base deficit: 1.4 mmol/L (ref 0.0–2.0)
Bicarbonate: 22.3 mmol/L (ref 20.0–28.0)
Drawn by: 470591
FIO2: 21
O2 Saturation: 98.7 %
Patient temperature: 98.6
pCO2 arterial: 34 mmHg (ref 32.0–48.0)
pH, Arterial: 7.432 (ref 7.350–7.450)
pO2, Arterial: 138 mmHg — ABNORMAL HIGH (ref 83.0–108.0)

## 2019-05-29 LAB — GLUCOSE, CAPILLARY: Glucose-Capillary: 95 mg/dL (ref 70–99)

## 2019-05-29 LAB — PROTIME-INR
INR: 1 (ref 0.8–1.2)
Prothrombin Time: 12.8 seconds (ref 11.4–15.2)

## 2019-05-29 LAB — ABO/RH: ABO/RH(D): B POS

## 2019-05-29 LAB — HEMOGLOBIN A1C
Hgb A1c MFr Bld: 11.1 % — ABNORMAL HIGH (ref 4.8–5.6)
Mean Plasma Glucose: 271.87 mg/dL

## 2019-05-29 LAB — SURGICAL PCR SCREEN
MRSA, PCR: NEGATIVE
Staphylococcus aureus: NEGATIVE

## 2019-05-29 LAB — APTT: aPTT: 29 seconds (ref 24–36)

## 2019-05-29 NOTE — Progress Notes (Addendum)
PCP - Dr. Redmond School Cardiologist - Dr. Rollene Fare  Chest x-ray - N/A EKG - 04/17/19 Stress Test - 2009 ECHO - 2009 Cardiac Cath - denies  Sleep Study - denies   Fasting Blood Sugar - 150-200 Checks Blood Sugar -6-7 times a day  Blood Thinner Instructions: patient instructed per MD to hold Eliquis 48 hours prior to surgery. Last dose 06/01/19 Aspirin Instructions: Patient instructed to hold all Aspirin, NSAID's, herbal medications, fish oil and vitamins 7 days prior to surgery.   Anesthesia review: cardiac history  Patient denies shortness of breath, fever, cough and chest pain at PAT appointment   Patient verbalized understanding of instructions that were given to them at the PAT appointment. Patient was also instructed that they will need to review over the PAT instructions again at home before surgery.

## 2019-05-31 ENCOUNTER — Other Ambulatory Visit (HOSPITAL_COMMUNITY)
Admission: RE | Admit: 2019-05-31 | Discharge: 2019-05-31 | Disposition: A | Discharge status: Home / Self Care | Payer: Medicare HMO | Source: Ambulatory Visit | Attending: Vascular Surgery | Admitting: Vascular Surgery

## 2019-05-31 ENCOUNTER — Other Ambulatory Visit: Payer: Self-pay

## 2019-05-31 DIAGNOSIS — Z01812 Encounter for preprocedural laboratory examination: Secondary | ICD-10-CM | POA: Diagnosis not present

## 2019-05-31 DIAGNOSIS — Z20828 Contact with and (suspected) exposure to other viral communicable diseases: Secondary | ICD-10-CM | POA: Diagnosis not present

## 2019-05-31 LAB — SARS CORONAVIRUS 2 (TAT 6-24 HRS): SARS Coronavirus 2: NEGATIVE

## 2019-06-01 ENCOUNTER — Other Ambulatory Visit: Payer: Self-pay

## 2019-06-01 ENCOUNTER — Ambulatory Visit (INDEPENDENT_AMBULATORY_CARE_PROVIDER_SITE_OTHER): Payer: Medicare HMO | Admitting: Internal Medicine

## 2019-06-01 ENCOUNTER — Encounter: Payer: Self-pay | Admitting: Internal Medicine

## 2019-06-01 VITALS — BP 110/80 | HR 83 | Temp 98.3°F | Ht 65.0 in | Wt 160.0 lb

## 2019-06-01 DIAGNOSIS — E785 Hyperlipidemia, unspecified: Secondary | ICD-10-CM | POA: Diagnosis not present

## 2019-06-01 DIAGNOSIS — H0266 Xanthelasma of left eye, unspecified eyelid: Secondary | ICD-10-CM | POA: Diagnosis not present

## 2019-06-01 DIAGNOSIS — E1169 Type 2 diabetes mellitus with other specified complication: Secondary | ICD-10-CM

## 2019-06-01 DIAGNOSIS — E1140 Type 2 diabetes mellitus with diabetic neuropathy, unspecified: Secondary | ICD-10-CM | POA: Diagnosis not present

## 2019-06-01 DIAGNOSIS — H0263 Xanthelasma of right eye, unspecified eyelid: Secondary | ICD-10-CM

## 2019-06-01 LAB — POCT GLYCOSYLATED HEMOGLOBIN (HGB A1C): Hemoglobin A1C: 10.8 % — AB (ref 4.0–5.6)

## 2019-06-01 MED ORDER — NOVOLOG MIX 70/30 FLEXPEN (70-30) 100 UNIT/ML ~~LOC~~ SUPN: 16.0000 [IU] | PEN_INJECTOR | Freq: Two times a day (BID) | SUBCUTANEOUS | 11 refills | Status: DC

## 2019-06-01 MED ORDER — METFORMIN HCL 500 MG PO TABS: 500.0000 mg | ORAL_TABLET | Freq: Two times a day (BID) | ORAL | 1 refills | Status: DC

## 2019-06-01 MED ORDER — INSULIN PEN NEEDLE 32G X 4 MM MISC: 11 refills | Status: DC

## 2019-06-01 NOTE — Progress Notes (Signed)
Name: Marissa [name]  MRN/ DOB: [id number], 08-Mar-1966   Age/ Sex: 53 y.o., female    PCP: Redmond School, MD   Reason for Endocrinology Evaluation: Type 2 Diabetes Mellitus     Date of Initial Endocrinology Visit: 06/04/2019     PATIENT IDENTIFIER: [name] is a 53 y.o. female with a past medical history of HTN,T2DM, Dyslipidemia and Lung Ca (2006) . The patient presented for initial endocrinology clinic visit on 06/04/2019 for consultative assistance with her diabetes management.    HPI: Marissa [name] was    Diagnosed with T2DM in 2006 Prior Medications tried/Intolerance: Glipizide, glimepiride, metformin, insulin 01/2019, Farxiga ( not covered)  Currently checking blood sugars 3 x / day,  before meals  Hypoglycemia episodes : no              Hemoglobin A1c has ranged from 6.7% in 2015, peaking at 13.8% in 02/2019. Patient required assistance for hypoglycemia:  Patient has required hospitalization within the last 1 year from hyper or hypoglycemia:   In terms of diet, the patient  Eats 3 meals a day, and sometimes she would use splenda shakes as a meal replacement.     HOME DIABETES REGIMEN: Glimepiride 4 mg daily  Metformin 500 mg 2 tablets BID Levemir 30 units daily   Statin: Lipitor  ACE-I/ARB: Yes Prior Diabetic Education: no   METER DOWNLOAD SUMMARY: Date range evaluated: 8/8-8/21/20 Fingerstick Blood Glucose Tests = 76 Average Number Tests/Day = 5.4 Overall Mean FS Glucose =169   BG Ranges: Low = 99 High = 294   Hypoglycemic Events/30 Days: BG < 50 = 0 Episodes of symptomatic severe hypoglycemia = 0   DIABETIC COMPLICATIONS: Microvascular complications:   Neuropathy   Denies: CKD , retinopathy   Last eye exam: Completed 04/2018  Macrovascular complications:   CAD ( Pending PCI next week)   Denies:  PVD, CVA   PAST HISTORY: Past Medical History:  Past Medical History:  Diagnosis Date  . Adenocarcinoma of lung ([place]) 02/06/2014  .  Anxiety   . Arthritis   . COPD (chronic obstructive pulmonary disease) ([place])   . Coronary artery disease   . Diabetes mellitus   . GERD (gastroesophageal reflux disease)   . Hypertension   . Hypothyroidism   . Lung cancer [facility])    Past Surgical History:  Past Surgical History:  Procedure Laterality Date  . APPENDECTOMY    . CHOLECYSTECTOMY    . LOBECTOMY Left 2006  . NM MYOCAR IMG MI  05/2008   lexiscan -perfusion defect in anterior myocardium (breast attenuation); remaining myocardium with NO evidence of ischemia or infarct; EF 78%; low risk scan  . OOPHORECTOMY Left   . PARTIAL HYSTERECTOMY  1985  . SALPINGOOPHORECTOMY Left    benign 9lb mass removed and a massive wall of smaller tumors removed  . TRANSTHORACIC ECHOCARDIOGRAM  05/2008   EF =/>55%, mild MR; trace TR      Social History:  reports that she has been smoking cigarettes. She has a 8.00 pack-year smoking history. She has never used smokeless tobacco. She reports that she does not drink alcohol or use drugs. Family History:  Family History  Problem Relation Age of Onset  . Colon cancer Mother   . Lung cancer Mother   . Heart disease Mother   . Heart disease Father   . Ovarian cancer Sister   . Hepatitis Brother   . Hernia Brother      HOME MEDICATIONS: Allergies as  of 06/01/2019      Reactions   Bee Venom Anaphylaxis   Penicillins Swelling, Rash   Did it involve swelling of the face/tongue/throat, SOB, or low BP? Yes-whole body (swelling) Did it involve sudden or severe rash/hives, skin peeling, or any reaction on the inside of your mouth or nose? Yes-Hives Did you need to seek medical attention at a hospital or doctor's office? Yes When did it last happen?Childhood If all above answers are "NO", may proceed with cephalosporin use.   Poison Oak Extract Anaphylaxis      Medication List       Accurate as of June 01, 2019 11:59 PM. If you have any questions, ask your nurse or doctor.         STOP taking these medications   glimepiride 4 MG tablet Commonly known as: AMARYL Stopped by: Dorita Sciara, MD   Insulin Detemir 100 UNIT/ML Pen Commonly known as: LEVEMIR Stopped by: Dorita Sciara, MD     TAKE these medications   albuterol-ipratropium 18-103 MCG/ACT inhaler Commonly known as: COMBIVENT Inhale 2 puffs into the lungs every 6 (six) hours as needed for wheezing or shortness of breath.   ALPRAZolam 0.5 MG tablet Commonly known as: XANAX Take 0.5 mg by mouth See admin instructions. Take 0.5 mg at bedtime every night. Take 0.5mg  once a day as needed for anxiety.   apixaban 5 MG Tabs tablet Commonly known as: Eliquis Take 1 tablet (5 mg total) by mouth 2 (two) times daily.   aspirin EC 81 MG tablet Take 81 mg by mouth daily.   atorvastatin 80 MG tablet Commonly known as: LIPITOR Take 1 tablet (80 mg total) by mouth daily at 6 PM.   busPIRone 30 MG tablet Commonly known as: BUSPAR Take 15 mg by mouth 2 (two) times a day.   Chantix Starting Month Pak 0.5 MG X 11 & 1 MG X 42 tablet Generic drug: varenicline Take one 0.5 mg tablet by mouth once daily for 3 days, then increase to one 0.5 mg tablet twice daily for 4 days, then increase to one 1 mg tablet twice daily.   varenicline 1 MG tablet Commonly known as: Chantix Continuing Month Pak Take 1 tablet (1 mg total) by mouth 2 (two) times daily.   Doxepin HCl 6 MG Tabs Take 6 mg by mouth at bedtime.   EpiPen 2-Pak 0.3 mg/0.3 mL Soaj injection Generic drug: EPINEPHrine Inject 0.3 mg into the muscle as needed for anaphylaxis. Has available   esomeprazole 40 MG capsule Commonly known as: NEXIUM Take 1 capsule (40 mg total) by mouth daily before breakfast.   ezetimibe 10 MG tablet Commonly known as: ZETIA Take 10 mg by mouth daily.   fish oil-omega-3 fatty acids 1000 MG capsule Take 2 g by mouth 2 (two) times a day. 4 tablets daily   FreeStyle Libre 14 Day Reader Kerrin Mo See admin  instructions.   FreeStyle Libre 14 Day Sensor Misc REPLACE EVERY 14 DAYS   Insulin Pen Needle 32G X 4 MM Misc 2x daily Started by: Dorita Sciara, MD   levothyroxine 50 MCG tablet Commonly known as: SYNTHROID Take 1 tablet (50 mcg total) by mouth daily.   lisinopril 10 MG tablet Commonly known as: ZESTRIL Take 1 tablet (10 mg total) by mouth daily.   metFORMIN 500 MG tablet Commonly known as: Glucophage Take 1 tablet (500 mg total) by mouth 2 (two) times daily with a meal. What changed:   how much to  take  additional instructions   nicotine 21 mg/24hr patch Commonly known as: NICODERM CQ - dosed in mg/24 hours Place 21 mg onto the skin daily as needed (smoking cessation).   NovoLOG Mix 70/30 FlexPen (70-30) 100 UNIT/ML FlexPen Generic drug: insulin aspart protamine - aspart Inject 0.16 mLs (16 Units total) into the skin 2 (two) times daily with a meal. Started by: Dorita Sciara, MD   oxyCODONE 15 MG immediate release tablet Commonly known as: ROXICODONE Take 15 mg by mouth every 4 (four) hours as needed for pain.        ALLERGIES: Allergies  Allergen Reactions  . Bee Venom Anaphylaxis  . Penicillins Swelling, Rash and Other (See Comments)    Did it involve swelling of the face/tongue/throat, SOB, or low BP? Yes-whole body (swelling) Did it involve sudden or severe rash/hives, skin peeling, or any reaction on the inside of your mouth or nose? Yes-Hives Did you need to seek medical attention at a hospital or doctor's office? Yes When did it last happen?Childhood If all above answers are "NO", may proceed with cephalosporin use.  . Poison Oak Extract Anaphylaxis     REVIEW OF SYSTEMS: A comprehensive ROS was conducted with the patient and is negative except as per HPI and below:  Review of Systems  Constitutional: Negative for chills and fever.  HENT: Negative for congestion and sore throat.   Eyes: Negative for blurred vision and pain.   Respiratory: Negative for cough and shortness of breath.   Cardiovascular: Negative for chest pain and palpitations.  Gastrointestinal: Positive for diarrhea. Negative for nausea.  Genitourinary: Positive for urgency. Negative for frequency.  Neurological: Negative for tingling and tremors.  Endo/Heme/Allergies: Positive for polydipsia.      OBJECTIVE:   VITAL SIGNS: BP 110/80 (BP Location: Left Arm, Patient Position: Sitting, Cuff Size: Normal)   Pulse 83   Temp 98.3 F (36.8 C)   Ht 5\' 5"  (1.651 m)   Wt 160 lb (72.6 kg)   SpO2 97%   BMI 26.63 kg/m    PHYSICAL EXAM:  General: Pt appears well and is in NAD  Hydration: Well-hydrated with moist mucous membranes and good skin turgor  HEENT: Head: Unremarkable with good dentition. Oropharynx clear without exudate.  Eyes: External eye exam normal without stare, lid lag or exophthalmos.  EOM intact.  PERRL.  Neck: General: Supple without adenopathy or carotid bruits. Thyroid: Thyroid size normal.  No goiter or nodules appreciated. No thyroid bruit.  Lungs: Clear with good BS bilat with no rales, rhonchi, or wheezes  Heart: RRR with normal S1 and S2 and no gallops; no murmurs; no rub  Abdomen: Normoactive bowel sounds, soft, nontender, without masses or organomegaly palpable  Extremities:  Lower extremities - No pretibial edema. No lesions.  Skin: Normal texture and temperature to palpation. Xanthelasma on upper eyelids. No Acanthosis nigricans/skin tags.  Neuro: MS is good with appropriate affect, pt is alert and Ox3    DM foot exam: 06/01/19  The skin of the feet is intact without sores or ulcerations. The pedal pulses are 2+ on right and 2+ on left. The sensation is decreased to a screening 5.07, 10 gram monofilament bilaterally  DATA REVIEWED:  Lab Results  Component Value Date   HGBA1C 10.8 (A) 06/01/2019   HGBA1C 11.1 (H) 05/29/2019   HGBA1C 12.4 (H) 04/17/2019   Lab Results  Component Value Date   LDLCALC 152 (H)  04/17/2019   CREATININE 0.59 06/01/2019   No results found  for: MICRALBCREAT  Lab Results  Component Value Date   CHOL 228 (H) 04/17/2019   HDL 24 (L) 04/17/2019   LDLCALC 152 (H) 04/17/2019   TRIG 258 (H) 04/17/2019   CHOLHDL 9.5 04/17/2019       Results for Marissa [name], Marissa [name] (MRN [id number]) as of 06/04/2019 07:40  Ref. Range 06/01/2019 15:56  Sodium Latest Ref Range: 135 - 146 mmol/L 139  Potassium Latest Ref Range: 3.5 - 5.3 mmol/L 4.2  Chloride Latest Ref Range: 98 - 110 mmol/L 103  CO2 Latest Ref Range: 20 - 32 mmol/L 23  Glucose Latest Ref Range: 65 - 99 mg/dL 95  BUN Latest Ref Range: 7 - 25 mg/dL 13  Creatinine Latest Ref Range: 0.50 - 1.05 mg/dL 0.59  Calcium Latest Ref Range: 8.6 - 10.4 mg/dL 10.1  BUN/Creatinine Ratio Latest Ref Range: 6 - 22 (calc) NOT APPLICABLE   ASSESSMENT / PLAN / RECOMMENDATIONS:   1) Type 2 Diabetes Mellitus, Poorly controlled, With Neuropathic and Macrovascular  complications - Most recent A1c of 10.8 %. Goal A1c < 7.0 %.    - Historically she has poorly controlled diabetes, recently she has started to make changes, this is since her hospitalization in July. She is using online resources such as facebook to gain educations. Her Glucose readings are variable due to variable CHO intake and variable dietary indiscretions.  - Will refer her to our CDE - We discussed the importance of controlling her glucose rapidly without hypoglycemia.  - We discussed the increased risk of hypoglycemia with a combination of insulin and SU.  - She has chronic nausea and diarrhea that she attributes to metformin.  Discussed the importance of taking insulin before the meal (5-10 mins) and NOT after due to risk of hypoglycemia, she was also instructed to not miss lunch with this regimen.   MEDICATIONS: - Stop Levemir  - Stop Glimepiride  - Decrease Metformin to ONE tablet with Breakfast and ONE tablet with Supper - Start Novolog Mix 16 units with Breakfast and 16  units with Supper     EDUCATION / INSTRUCTIONS:  BG monitoring instructions: Patient is instructed to check her blood sugars 2 times a day, fasting and supper.  Call [facility] if: BG persistently < 70 or > 300. . I reviewed the Rule of 15 for the treatment of hypoglycemia in detail with the patient. Literature supplied.   2) Diabetic complications:   Eye: Does not have known diabetic retinopathy.   Neuro/ Feet: Does have known diabetic peripheral neuropathy.  Renal: Patient does not have known baseline CKD. She is on an ACEI/ARB at present.   3) Lipids: Patient is was recently started on lipitor. Pt with xanthelasma.     F/u in 8 weeks     Signed electronically by: Mack Guise, MD  [facility]  [facility] [address] [place], [place] [address] Phone: [phone] FAX: [phone]   CC: [address], [place] [address] Phone: [phone]  Fax: [phone]    Return to Endocrinology clinic as below: Future Appointments  Date Time Provider [place]  07/24/2019  1:20 PM Christalyn Goertz, Melanie Crazier, MD [facility] None  11/15/2019 12:10 PM [facility] None  11/15/2019  1:00 PM AP-CT 1 AP-CT [place] [facility]  11/19/2019 11:10 AM Roger Shelter, FNP [facility] None

## 2019-06-01 NOTE — Patient Instructions (Addendum)
-   Stop Levemir  - Stop Glimepiride   - Decrease Metformin to ONE tablet with Breakfast and ONE tablet with Supper  - Start Novolog Mix 16 units with Breakfast and 16 units with Supper   - Check sugar before breakfast and Supper    -HOW TO TREAT LOW BLOOD SUGARS (Blood sugar LESS THAN 70 MG/DL)  Please follow the RULE OF 15 for the treatment of hypoglycemia treatment (when your (blood sugars are less than 70 mg/dL)    STEP 1: Take 15 grams of carbohydrates when your blood sugar is low, which includes:   3-4 GLUCOSE TABS  OR  3-4 OZ OF JUICE OR REGULAR SODA OR  ONE TUBE OF GLUCOSE GEL     STEP 2: RECHECK blood sugar in 15 MINUTES STEP 3: If your blood sugar is still low at the 15 minute recheck --> then, go back to STEP 1 and treat AGAIN with another 15 grams of carbohydrates.

## 2019-06-01 NOTE — Anesthesia Preprocedure Evaluation (Addendum)
Anesthesia Evaluation  Patient identified by MRN, date of birth, ID band Patient awake    Reviewed: Allergy & Precautions, NPO status , Patient's Chart, lab work & pertinent test results  Airway Mallampati: II  TM Distance: >3 FB     Dental   Pulmonary Current Smoker,    breath sounds clear to auscultation       Cardiovascular hypertension, + CAD and + Peripheral Vascular Disease   Rhythm:Regular Rate:Normal     Neuro/Psych    GI/Hepatic Neg liver ROS, GERD  ,  Endo/Other  diabetesHypothyroidism   Renal/GU negative Renal ROS     Musculoskeletal   Abdominal   Peds  Hematology   Anesthesia Other Findings   Reproductive/Obstetrics                           Anesthesia Physical Anesthesia Plan  ASA: III  Anesthesia Plan: General   Post-op Pain Management:    Induction: Intravenous  PONV Risk Score and Plan: 2 and Ondansetron and Midazolam  Airway Management Planned: Oral ETT  Additional Equipment: Arterial line  Intra-op Plan:   Post-operative Plan: Extubation in OR  Informed Consent:     Dental advisory given  Plan Discussed with: Anesthesiologist and CRNA  Anesthesia Plan Comments: (Admitted July 2020 for DKA. During admission CT showed ulcerated plaque of the descending thoracic aorta extending over approximately 6 cm.  This appears to have embolized to her left kidney and spleen.   Uncontrolled IDDMII. A1c 11.1 on 05/29/19. Dr. Scot Dock aware, advised to proceed as planned.   Lung CA s/p left upper lobectomy on 03/23/2005. Continues to smoke 1 and 1/2 pack/day of cigarettes.    Remote cardiac workup for DOE. Normal echo and nonischemic nuclear stress in 2009.  Nuclear stress 2009: Impression: Perfusion defect in the anterior myocardial region is consistent with breast attenuation.  The remaining myocardium demonstrates normal myocardial perfusion with no evidence of  ischemia or infarct.  The post stress left ventricle is normal in size. The post stress ejection fraction 70%.  The post stress left ventricular function is normal. Unremarkable pharmacological stress test. This is a low risk scan.  Echo 2009: Left ventricular systolic function was normal. The left atrial size is normal. Ejection fraction = >55%. The transmitral spectral Doppler flow pattern is normal for age. Pericardial fat pad noted. No significant valvular disease seen. )   Anesthesia Quick Evaluation

## 2019-06-04 ENCOUNTER — Inpatient Hospital Stay (HOSPITAL_COMMUNITY): Payer: Medicare HMO

## 2019-06-04 ENCOUNTER — Inpatient Hospital Stay (HOSPITAL_COMMUNITY): Payer: Medicare HMO | Admitting: Anesthesiology

## 2019-06-04 ENCOUNTER — Other Ambulatory Visit: Payer: Self-pay

## 2019-06-04 ENCOUNTER — Encounter (HOSPITAL_COMMUNITY)
Admission: RE | Disposition: A | Discharge status: Home / Self Care | Payer: Self-pay | Source: Home / Self Care | Attending: Vascular Surgery

## 2019-06-04 ENCOUNTER — Encounter (HOSPITAL_COMMUNITY): Payer: Self-pay

## 2019-06-04 ENCOUNTER — Inpatient Hospital Stay (HOSPITAL_COMMUNITY)
Admission: RE | Admit: 2019-06-04 | Discharge: 2019-06-05 | DRG: 221 | Disposition: A | Discharge status: Home / Self Care | Payer: Medicare HMO | Attending: Vascular Surgery | Admitting: Vascular Surgery

## 2019-06-04 ENCOUNTER — Inpatient Hospital Stay (HOSPITAL_COMMUNITY): Payer: Medicare HMO | Admitting: Physician Assistant

## 2019-06-04 DIAGNOSIS — Z800 Family history of malignant neoplasm of digestive organs: Secondary | ICD-10-CM

## 2019-06-04 DIAGNOSIS — Z8041 Family history of malignant neoplasm of ovary: Secondary | ICD-10-CM

## 2019-06-04 DIAGNOSIS — Z8249 Family history of ischemic heart disease and other diseases of the circulatory system: Secondary | ICD-10-CM

## 2019-06-04 DIAGNOSIS — Z85118 Personal history of other malignant neoplasm of bronchus and lung: Secondary | ICD-10-CM | POA: Diagnosis not present

## 2019-06-04 DIAGNOSIS — F17210 Nicotine dependence, cigarettes, uncomplicated: Secondary | ICD-10-CM | POA: Diagnosis present

## 2019-06-04 DIAGNOSIS — Z794 Long term (current) use of insulin: Secondary | ICD-10-CM

## 2019-06-04 DIAGNOSIS — I2510 Atherosclerotic heart disease of native coronary artery without angina pectoris: Secondary | ICD-10-CM | POA: Diagnosis present

## 2019-06-04 DIAGNOSIS — Z880 Allergy status to penicillin: Secondary | ICD-10-CM

## 2019-06-04 DIAGNOSIS — E119 Type 2 diabetes mellitus without complications: Secondary | ICD-10-CM | POA: Diagnosis not present

## 2019-06-04 DIAGNOSIS — K219 Gastro-esophageal reflux disease without esophagitis: Secondary | ICD-10-CM | POA: Diagnosis present

## 2019-06-04 DIAGNOSIS — Z801 Family history of malignant neoplasm of trachea, bronchus and lung: Secondary | ICD-10-CM

## 2019-06-04 DIAGNOSIS — I10 Essential (primary) hypertension: Secondary | ICD-10-CM | POA: Diagnosis present

## 2019-06-04 DIAGNOSIS — Z955 Presence of coronary angioplasty implant and graft: Secondary | ICD-10-CM | POA: Diagnosis not present

## 2019-06-04 DIAGNOSIS — Z79890 Hormone replacement therapy (postmenopausal): Secondary | ICD-10-CM

## 2019-06-04 DIAGNOSIS — J449 Chronic obstructive pulmonary disease, unspecified: Secondary | ICD-10-CM | POA: Diagnosis present

## 2019-06-04 DIAGNOSIS — I700 Atherosclerosis of aorta: Secondary | ICD-10-CM | POA: Diagnosis present

## 2019-06-04 DIAGNOSIS — R69 Illness, unspecified: Secondary | ICD-10-CM | POA: Diagnosis not present

## 2019-06-04 DIAGNOSIS — Z79899 Other long term (current) drug therapy: Secondary | ICD-10-CM

## 2019-06-04 DIAGNOSIS — E785 Hyperlipidemia, unspecified: Secondary | ICD-10-CM | POA: Diagnosis not present

## 2019-06-04 DIAGNOSIS — Z91030 Bee allergy status: Secondary | ICD-10-CM

## 2019-06-04 DIAGNOSIS — I712 Thoracic aortic aneurysm, without rupture: Secondary | ICD-10-CM | POA: Diagnosis not present

## 2019-06-04 HISTORY — PX: THORACIC AORTIC ENDOVASCULAR STENT GRAFT: SHX6112

## 2019-06-04 LAB — GLUCOSE, CAPILLARY
Glucose-Capillary: 207 mg/dL — ABNORMAL HIGH (ref 70–99)
Glucose-Capillary: 256 mg/dL — ABNORMAL HIGH (ref 70–99)
Glucose-Capillary: 180 mg/dL — ABNORMAL HIGH (ref 70–99)
Glucose-Capillary: 149 mg/dL — ABNORMAL HIGH (ref 70–99)
Glucose-Capillary: 101 mg/dL — ABNORMAL HIGH (ref 70–99)

## 2019-06-04 LAB — BASIC METABOLIC PANEL
Anion gap: 10 (ref 5–15)
BUN: 12 mg/dL (ref 6–20)
CO2: 23 mmol/L (ref 22–32)
Calcium: 8.6 mg/dL — ABNORMAL LOW (ref 8.9–10.3)
Chloride: 106 mmol/L (ref 98–111)
Creatinine, Ser: 0.56 mg/dL (ref 0.44–1.00)
GFR calc Af Amer: >60 mL/min (ref >60)
GFR calc non Af Amer: >60 mL/min (ref >60)
Glucose, Bld: 177 mg/dL — ABNORMAL HIGH (ref 70–99)
Potassium: 3.8 mmol/L (ref 3.5–5.1)
Sodium: 139 mmol/L (ref 135–145)

## 2019-06-04 LAB — CBC
HCT: 34.8 % — ABNORMAL LOW (ref 36.0–46.0)
Hemoglobin: 11.2 g/dL — ABNORMAL LOW (ref 12.0–15.0)
MCH: 29.9 pg (ref 26.0–34.0)
MCHC: 32.2 g/dL (ref 30.0–36.0)
MCV: 92.8 fL (ref 80.0–100.0)
Platelets: 259 10*3/uL (ref 150–400)
RBC: 3.75 MIL/uL — ABNORMAL LOW (ref 3.87–5.11)
RDW: 12.7 % (ref 11.5–15.5)
WBC: 11.9 10*3/uL — ABNORMAL HIGH (ref 4.0–10.5)
nRBC: 0 % (ref 0.0–0.2)

## 2019-06-04 LAB — APTT: aPTT: 27 seconds (ref 24–36)

## 2019-06-04 LAB — POCT ACTIVATED CLOTTING TIME: Activated Clotting Time: 213 seconds

## 2019-06-04 LAB — PROTIME-INR
INR: 1 (ref 0.8–1.2)
Prothrombin Time: 13.1 seconds (ref 11.4–15.2)

## 2019-06-04 LAB — MAGNESIUM: Magnesium: 1.6 mg/dL — ABNORMAL LOW (ref 1.7–2.4)

## 2019-06-04 SURGERY — INSERTION, ENDOVASCULAR STENT GRAFT, AORTA, THORACIC
Anesthesia: General | Site: Groin

## 2019-06-04 MED ORDER — INSULIN ASPART 100 UNIT/ML ~~LOC~~ SOLN
0.0000 [IU] | Freq: Three times a day (TID) | SUBCUTANEOUS | Status: DC
  Administered 2019-06-04 – 2019-06-05 (×2): 2 [IU] via SUBCUTANEOUS

## 2019-06-04 MED ORDER — HEPARIN SODIUM (PORCINE) 1000 UNIT/ML IJ SOLN
INTRAMUSCULAR | Status: AC
  Filled 2019-06-04: qty 1

## 2019-06-04 MED ORDER — ONDANSETRON HCL 4 MG/2ML IJ SOLN
INTRAMUSCULAR | Status: DC | PRN
  Administered 2019-06-04: 4 mg via INTRAVENOUS

## 2019-06-04 MED ORDER — SUGAMMADEX SODIUM 200 MG/2ML IV SOLN
INTRAVENOUS | Status: DC | PRN
  Administered 2019-06-04: 200 mg via INTRAVENOUS

## 2019-06-04 MED ORDER — INSULIN ASPART PROT & ASPART (70-30 MIX) 100 UNIT/ML ~~LOC~~ SUSP
16.0000 [IU] | Freq: Two times a day (BID) | SUBCUTANEOUS | Status: DC
  Administered 2019-06-04 – 2019-06-05 (×2): 16 [IU] via SUBCUTANEOUS
  Filled 2019-06-04: qty 10

## 2019-06-04 MED ORDER — DEXMEDETOMIDINE HCL IN NACL 200 MCG/50ML IV SOLN
INTRAVENOUS | Status: DC | PRN
  Administered 2019-06-04: 20 ug via INTRAVENOUS

## 2019-06-04 MED ORDER — ALPRAZOLAM 0.5 MG PO TABS
0.5000 mg | ORAL_TABLET | Freq: Every day | ORAL | Status: DC
  Administered 2019-06-04: 0.5 mg via ORAL
  Filled 2019-06-04: qty 1

## 2019-06-04 MED ORDER — KCL IN DEXTROSE-NACL 20-5-0.45 MEQ/L-%-% IV SOLN: INTRAVENOUS | Status: DC

## 2019-06-04 MED ORDER — CEFAZOLIN SODIUM-DEXTROSE 2-4 GM/100ML-% IV SOLN: 2.0000 g | Freq: Three times a day (TID) | INTRAVENOUS | Status: DC

## 2019-06-04 MED ORDER — HEPARIN SODIUM (PORCINE) 1000 UNIT/ML IJ SOLN
INTRAMUSCULAR | Status: DC | PRN
  Administered 2019-06-04: 7000 [IU] via INTRAVENOUS

## 2019-06-04 MED ORDER — PROTAMINE SULFATE 10 MG/ML IV SOLN
INTRAVENOUS | Status: DC | PRN
  Administered 2019-06-04: 10 mg via INTRAVENOUS

## 2019-06-04 MED ORDER — NICOTINE 21 MG/24HR TD PT24: 21.0000 mg | MEDICATED_PATCH | Freq: Every day | TRANSDERMAL | Status: DC | PRN

## 2019-06-04 MED ORDER — FENTANYL CITRATE (PF) 250 MCG/5ML IJ SOLN
INTRAMUSCULAR | Status: DC | PRN
  Administered 2019-06-04 (×2): 50 ug via INTRAVENOUS
  Administered 2019-06-04: 100 ug via INTRAVENOUS
  Administered 2019-06-04: 50 ug via INTRAVENOUS

## 2019-06-04 MED ORDER — IPRATROPIUM-ALBUTEROL 0.5-2.5 (3) MG/3ML IN SOLN: 3.0000 mL | Freq: Four times a day (QID) | RESPIRATORY_TRACT | Status: DC | PRN

## 2019-06-04 MED ORDER — POTASSIUM CHLORIDE CRYS ER 20 MEQ PO TBCR: 20.0000 meq | EXTENDED_RELEASE_TABLET | Freq: Every day | ORAL | Status: DC | PRN

## 2019-06-04 MED ORDER — ACETAMINOPHEN 325 MG RE SUPP: 325.0000 mg | RECTAL | Status: DC | PRN

## 2019-06-04 MED ORDER — PANTOPRAZOLE SODIUM 40 MG PO TBEC
40.0000 mg | DELAYED_RELEASE_TABLET | Freq: Every day | ORAL | Status: DC
  Administered 2019-06-05: 40 mg via ORAL
  Filled 2019-06-04: qty 1

## 2019-06-04 MED ORDER — IODIXANOL 320 MG/ML IV SOLN
INTRAVENOUS | Status: DC | PRN
  Administered 2019-06-04: 48.8 mL via INTRAVENOUS

## 2019-06-04 MED ORDER — LACTATED RINGERS IV SOLN
INTRAVENOUS | Status: DC
  Administered 2019-06-04: 07:00:00 via INTRAVENOUS

## 2019-06-04 MED ORDER — ROCURONIUM BROMIDE 10 MG/ML (PF) SYRINGE
PREFILLED_SYRINGE | INTRAVENOUS | Status: DC | PRN
  Administered 2019-06-04: 50 mg via INTRAVENOUS
  Administered 2019-06-04: 10 mg via INTRAVENOUS

## 2019-06-04 MED ORDER — DOXEPIN HCL 10 MG/ML PO CONC
6.0000 mg | Freq: Every day | ORAL | Status: DC
  Administered 2019-06-04: 6 mg via ORAL
  Filled 2019-06-04: qty 0.6

## 2019-06-04 MED ORDER — GUAIFENESIN-DM 100-10 MG/5ML PO SYRP: 15.0000 mL | ORAL_SOLUTION | ORAL | Status: DC | PRN

## 2019-06-04 MED ORDER — DOXEPIN HCL 6 MG PO TABS: 6.0000 mg | ORAL_TABLET | Freq: Every day | ORAL | Status: DC

## 2019-06-04 MED ORDER — PROPOFOL 10 MG/ML IV BOLUS
INTRAVENOUS | Status: DC | PRN
  Administered 2019-06-04: 150 mg via INTRAVENOUS

## 2019-06-04 MED ORDER — OMEGA-3-ACID ETHYL ESTERS 1 G PO CAPS
2.0000 g | ORAL_CAPSULE | Freq: Every day | ORAL | Status: DC
  Administered 2019-06-05: 2 g via ORAL
  Filled 2019-06-04: qty 2

## 2019-06-04 MED ORDER — EPHEDRINE SULFATE 50 MG/ML IJ SOLN
INTRAMUSCULAR | Status: DC | PRN
  Administered 2019-06-04 (×2): 5 mg via INTRAVENOUS
  Administered 2019-06-04: 10 mg via INTRAVENOUS

## 2019-06-04 MED ORDER — HYDRALAZINE HCL 20 MG/ML IJ SOLN: 5.0000 mg | INTRAMUSCULAR | Status: DC | PRN

## 2019-06-04 MED ORDER — PHENOL 1.4 % MT LIQD: 1.0000 | OROMUCOSAL | Status: DC | PRN

## 2019-06-04 MED ORDER — CHLORHEXIDINE GLUCONATE CLOTH 2 % EX PADS: 6.0000 | MEDICATED_PAD | Freq: Once | CUTANEOUS | Status: DC

## 2019-06-04 MED ORDER — FENTANYL CITRATE (PF) 250 MCG/5ML IJ SOLN
INTRAMUSCULAR | Status: AC
  Filled 2019-06-04: qty 5

## 2019-06-04 MED ORDER — FENTANYL CITRATE (PF) 100 MCG/2ML IJ SOLN
INTRAMUSCULAR | Status: AC
  Administered 2019-06-04: 25 ug via INTRAVENOUS
  Filled 2019-06-04: qty 2

## 2019-06-04 MED ORDER — ALUM & MAG HYDROXIDE-SIMETH 200-200-20 MG/5ML PO SUSP: 15.0000 mL | ORAL | Status: DC | PRN

## 2019-06-04 MED ORDER — MORPHINE SULFATE (PF) 2 MG/ML IV SOLN: 2.0000 mg | INTRAVENOUS | Status: DC | PRN

## 2019-06-04 MED ORDER — IPRATROPIUM-ALBUTEROL 18-103 MCG/ACT IN AERO: 2.0000 | INHALATION_SPRAY | Freq: Four times a day (QID) | RESPIRATORY_TRACT | Status: DC | PRN

## 2019-06-04 MED ORDER — 0.9 % SODIUM CHLORIDE (POUR BTL) OPTIME
TOPICAL | Status: DC | PRN
  Administered 2019-06-04: 1000 mL

## 2019-06-04 MED ORDER — MIDAZOLAM HCL 2 MG/2ML IJ SOLN
INTRAMUSCULAR | Status: DC | PRN
  Administered 2019-06-04: 1 mg via INTRAVENOUS

## 2019-06-04 MED ORDER — PHENYLEPHRINE 40 MCG/ML (10ML) SYRINGE FOR IV PUSH (FOR BLOOD PRESSURE SUPPORT)
PREFILLED_SYRINGE | INTRAVENOUS | Status: DC | PRN
  Administered 2019-06-04: 120 ug via INTRAVENOUS
  Administered 2019-06-04: 80 ug via INTRAVENOUS
  Administered 2019-06-04: 120 ug via INTRAVENOUS

## 2019-06-04 MED ORDER — APIXABAN 5 MG PO TABS
5.0000 mg | ORAL_TABLET | Freq: Two times a day (BID) | ORAL | Status: DC
  Administered 2019-06-04 – 2019-06-05 (×2): 5 mg via ORAL
  Filled 2019-06-04 (×2): qty 1

## 2019-06-04 MED ORDER — LISINOPRIL 10 MG PO TABS
10.0000 mg | ORAL_TABLET | Freq: Every day | ORAL | Status: DC
  Administered 2019-06-05: 10 mg via ORAL
  Filled 2019-06-04: qty 1

## 2019-06-04 MED ORDER — VANCOMYCIN HCL IN DEXTROSE 1-5 GM/200ML-% IV SOLN
1000.0000 mg | Freq: Once | INTRAVENOUS | Status: AC
  Administered 2019-06-04: 1000 mg via INTRAVENOUS
  Filled 2019-06-04 (×2): qty 200

## 2019-06-04 MED ORDER — VANCOMYCIN HCL IN DEXTROSE 1-5 GM/200ML-% IV SOLN
1000.0000 mg | INTRAVENOUS | Status: AC
  Administered 2019-06-04: 1000 mg via INTRAVENOUS
  Filled 2019-06-04: qty 200

## 2019-06-04 MED ORDER — OMEGA-3 FATTY ACIDS 1000 MG PO CAPS: 2.0000 g | ORAL_CAPSULE | Freq: Every day | ORAL | Status: DC

## 2019-06-04 MED ORDER — PROPOFOL 10 MG/ML IV BOLUS
INTRAVENOUS | Status: AC
  Filled 2019-06-04: qty 20

## 2019-06-04 MED ORDER — ACETAMINOPHEN 325 MG PO TABS: 325.0000 mg | ORAL_TABLET | ORAL | Status: DC | PRN

## 2019-06-04 MED ORDER — METOPROLOL TARTRATE 5 MG/5ML IV SOLN: 2.0000 mg | INTRAVENOUS | Status: DC | PRN

## 2019-06-04 MED ORDER — METFORMIN HCL 500 MG PO TABS
500.0000 mg | ORAL_TABLET | Freq: Two times a day (BID) | ORAL | Status: DC
  Administered 2019-06-04 – 2019-06-05 (×2): 500 mg via ORAL
  Filled 2019-06-04 (×2): qty 1

## 2019-06-04 MED ORDER — SODIUM CHLORIDE 0.9 % IV SOLN
INTRAVENOUS | Status: AC
  Filled 2019-06-04: qty 1.2

## 2019-06-04 MED ORDER — SODIUM CHLORIDE 0.9 % IV SOLN
INTRAVENOUS | Status: DC | PRN
  Administered 2019-06-04: 50 ug/min via INTRAVENOUS

## 2019-06-04 MED ORDER — MAGNESIUM SULFATE 2 GM/50ML IV SOLN: 2.0000 g | Freq: Every day | INTRAVENOUS | Status: DC | PRN

## 2019-06-04 MED ORDER — LACTATED RINGERS IV SOLN
INTRAVENOUS | Status: DC | PRN
  Administered 2019-06-04: 07:00:00 via INTRAVENOUS

## 2019-06-04 MED ORDER — FENTANYL CITRATE (PF) 100 MCG/2ML IJ SOLN
25.0000 ug | INTRAMUSCULAR | Status: DC | PRN
  Administered 2019-06-04 (×3): 25 ug via INTRAVENOUS

## 2019-06-04 MED ORDER — PANTOPRAZOLE SODIUM 40 MG PO TBEC: 40.0000 mg | DELAYED_RELEASE_TABLET | Freq: Every day | ORAL | Status: DC

## 2019-06-04 MED ORDER — DOCUSATE SODIUM 100 MG PO CAPS
100.0000 mg | ORAL_CAPSULE | Freq: Every day | ORAL | Status: DC
  Administered 2019-06-05: 100 mg via ORAL
  Filled 2019-06-04: qty 1

## 2019-06-04 MED ORDER — INSULIN ASPART PROT & ASPART (70-30 MIX) 100 UNIT/ML PEN: 16.0000 [IU] | PEN_INJECTOR | Freq: Two times a day (BID) | SUBCUTANEOUS | Status: DC

## 2019-06-04 MED ORDER — ASPIRIN EC 81 MG PO TBEC
81.0000 mg | DELAYED_RELEASE_TABLET | Freq: Every day | ORAL | Status: DC
  Administered 2019-06-05: 09:00:00 81 mg via ORAL
  Filled 2019-06-04: qty 1

## 2019-06-04 MED ORDER — ATORVASTATIN CALCIUM 80 MG PO TABS
80.0000 mg | ORAL_TABLET | Freq: Every day | ORAL | Status: DC
  Administered 2019-06-04: 80 mg via ORAL
  Filled 2019-06-04: qty 1

## 2019-06-04 MED ORDER — ENOXAPARIN SODIUM 40 MG/0.4ML ~~LOC~~ SOLN: 40.0000 mg | SUBCUTANEOUS | Status: DC

## 2019-06-04 MED ORDER — SODIUM CHLORIDE 0.9 % IV SOLN: INTRAVENOUS | Status: DC

## 2019-06-04 MED ORDER — SODIUM CHLORIDE 0.9 % IV SOLN
INTRAVENOUS | Status: DC | PRN
  Administered 2019-06-04: 500 mL

## 2019-06-04 MED ORDER — SODIUM CHLORIDE 0.9 % IV SOLN: 500.0000 mL | Freq: Once | INTRAVENOUS | Status: DC | PRN

## 2019-06-04 MED ORDER — LIDOCAINE 2% (20 MG/ML) 5 ML SYRINGE
INTRAMUSCULAR | Status: DC | PRN
  Administered 2019-06-04: 70 mg via INTRAVENOUS

## 2019-06-04 MED ORDER — ONDANSETRON HCL 4 MG/2ML IJ SOLN: 4.0000 mg | Freq: Four times a day (QID) | INTRAMUSCULAR | Status: DC | PRN

## 2019-06-04 MED ORDER — EPHEDRINE 5 MG/ML INJ
INTRAVENOUS | Status: AC
  Filled 2019-06-04: qty 10

## 2019-06-04 MED ORDER — LEVOTHYROXINE SODIUM 50 MCG PO TABS
50.0000 ug | ORAL_TABLET | Freq: Every day | ORAL | Status: DC
  Administered 2019-06-05: 50 ug via ORAL
  Filled 2019-06-04: qty 1

## 2019-06-04 MED ORDER — OXYCODONE HCL 5 MG PO TABS
15.0000 mg | ORAL_TABLET | ORAL | Status: DC | PRN
  Administered 2019-06-04: 15 mg via ORAL
  Filled 2019-06-04: qty 3

## 2019-06-04 MED ORDER — LABETALOL HCL 5 MG/ML IV SOLN: 10.0000 mg | INTRAVENOUS | Status: DC | PRN

## 2019-06-04 MED ORDER — MIDAZOLAM HCL 2 MG/2ML IJ SOLN
INTRAMUSCULAR | Status: AC
  Filled 2019-06-04: qty 2

## 2019-06-04 MED ORDER — EZETIMIBE 10 MG PO TABS
10.0000 mg | ORAL_TABLET | Freq: Every day | ORAL | Status: DC
  Administered 2019-06-05: 09:00:00 10 mg via ORAL
  Filled 2019-06-04: qty 1

## 2019-06-04 SURGICAL SUPPLY — 49 items
CANISTER SUCT 3000ML PPV (MISCELLANEOUS) ×2 IMPLANT
CANNULA VESSEL 3MM 2 BLNT TIP (CANNULA) IMPLANT
CATH ACCU-VU SIZ PIG 5F 100CM (CATHETERS) IMPLANT
CATH BEACON 5 .035 65 KMP TIP (CATHETERS) ×1 IMPLANT
COVER PROBE W GEL 5X96 (DRAPES) ×2 IMPLANT
COVER WAND RF STERILE (DRAPES) ×1 IMPLANT
DERMABOND ADHESIVE PROPEN (GAUZE/BANDAGES/DRESSINGS)
DERMABOND ADVANCED (GAUZE/BANDAGES/DRESSINGS) ×1
DERMABOND ADVANCED .7 DNX12 (GAUZE/BANDAGES/DRESSINGS) ×1 IMPLANT
DERMABOND ADVANCED .7 DNX6 (GAUZE/BANDAGES/DRESSINGS) IMPLANT
DEVICE CLOSURE PERCLS PRGLD 6F (VASCULAR PRODUCTS) IMPLANT
DRSG TEGADERM 2-3/8X2-3/4 SM (GAUZE/BANDAGES/DRESSINGS) ×2 IMPLANT
DRYSEAL FLEXSHEATH 20FR 33CM (SHEATH) ×1
ELECT CAUTERY BLADE 6.4 (BLADE) ×1 IMPLANT
ELECT REM PT RETURN 9FT ADLT (ELECTROSURGICAL) ×4
ELECTRODE REM PT RTRN 9FT ADLT (ELECTROSURGICAL) ×2 IMPLANT
GLOVE BIO SURGEON STRL SZ 6.5 (GLOVE) ×1 IMPLANT
GLOVE BIO SURGEON STRL SZ7.5 (GLOVE) ×2 IMPLANT
GLOVE BIOGEL PI IND STRL 8 (GLOVE) ×1 IMPLANT
GLOVE BIOGEL PI INDICATOR 8 (GLOVE) ×1
GOWN STRL REUS W/ TWL LRG LVL3 (GOWN DISPOSABLE) ×3 IMPLANT
GOWN STRL REUS W/TWL LRG LVL3 (GOWN DISPOSABLE) ×3
KIT BASIN OR (CUSTOM PROCEDURE TRAY) ×2 IMPLANT
KIT TURNOVER KIT B (KITS) ×2 IMPLANT
NDL PERC 18GX7CM (NEEDLE) ×1 IMPLANT
NEEDLE PERC 18GX7CM (NEEDLE) ×2 IMPLANT
NS IRRIG 1000ML POUR BTL (IV SOLUTION) ×4 IMPLANT
PACK ENDOVASCULAR (PACKS) ×2 IMPLANT
PAD ARMBOARD 7.5X6 YLW CONV (MISCELLANEOUS) ×4 IMPLANT
PERCLOSE PROGLIDE 6F (VASCULAR PRODUCTS) ×6
SHEATH AVANTI 11CM 8FR (SHEATH) ×1 IMPLANT
SHEATH DRYSEAL FLEX 20FR 33CM (SHEATH) IMPLANT
SPONGE GAUZE 2X2 8PLY STRL LF (GAUZE/BANDAGES/DRESSINGS) ×1 IMPLANT
STAPLER VISISTAT (STAPLE) IMPLANT
STENT GRFT THORAC ACS 28X28X15 (Endovascular Graft) ×1 IMPLANT
STOPCOCK MORSE 400PSI 3WAY (MISCELLANEOUS) ×2 IMPLANT
SUT PROLENE 5 0 C 1 24 (SUTURE) IMPLANT
SUT VIC AB 2-0 CTB1 (SUTURE) IMPLANT
SUT VIC AB 3-0 SH 27 (SUTURE)
SUT VIC AB 3-0 SH 27X BRD (SUTURE) IMPLANT
SUT VICRYL 4-0 PS2 18IN ABS (SUTURE) ×3 IMPLANT
SYR 30ML LL (SYRINGE) IMPLANT
SYR 50ML LL SCALE MARK (SYRINGE) ×2 IMPLANT
TOWEL GREEN STERILE (TOWEL DISPOSABLE) ×2 IMPLANT
TRAY FOLEY MTR SLVR 16FR STAT (SET/KITS/TRAYS/PACK) ×2 IMPLANT
TUBING HIGH PRESSURE 120CM (CONNECTOR) ×2 IMPLANT
WIRE BENTSON .035X145CM (WIRE) ×2 IMPLANT
WIRE HI TORQ VERSACORE J 260CM (WIRE) ×1 IMPLANT
WIRE STIFF LUNDERQUIST 260CM (WIRE) ×1 IMPLANT

## 2019-06-04 NOTE — Anesthesia Procedure Notes (Signed)
Procedure Name: Intubation Date/Time: 06/04/2019 7:43 AM Performed by: Elayne Snare, CRNA Pre-anesthesia Checklist: Patient identified, Emergency Drugs available, Suction available and Patient being monitored Patient Re-evaluated:Patient Re-evaluated prior to induction Oxygen Delivery Method: Circle System Utilized Preoxygenation: Pre-oxygenation with 100% oxygen Induction Type: IV induction Ventilation: Mask ventilation without difficulty Laryngoscope Size: Mac and 3 Grade View: Grade I Tube type: Oral Tube size: 7.0 mm Number of attempts: 1 Airway Equipment and Method: Stylet Placement Confirmation: ETT inserted through vocal cords under direct vision,  positive ETCO2 and breath sounds checked- equal and bilateral Secured at: 21 cm Tube secured with: Tape Dental Injury: Teeth and Oropharynx as per pre-operative assessment

## 2019-06-04 NOTE — Progress Notes (Signed)
Pt transferred to 4E-06 via bed from PACU with staff. CHG bath given. Tele applied, CCMD notified. Pt oriented to room, call bell, and bed. Call bell within reach. VSS. Will continue to monitor.  Amanda Cockayne, RN

## 2019-06-04 NOTE — Anesthesia Postprocedure Evaluation (Signed)
Anesthesia Post Note  Patient: Marissa [name]  Procedure(s) Performed: THORACIC AORTIC ENDOVASCULAR STENT GRAFT (N/A Groin)     Patient location during evaluation: PACU Anesthesia Type: General Level of consciousness: awake Pain management: pain level controlled Vital Signs Assessment: post-procedure vital signs reviewed and stable Respiratory status: spontaneous breathing Cardiovascular status: stable Postop Assessment: no apparent nausea or vomiting Anesthetic complications: no    Last Vitals:  Vitals:   06/04/19 1246 06/04/19 1300  BP: (!) 100/59 (!) 91/57  Pulse: 76 69  Resp: 19 13  Temp:    SpO2: 96% 98%    Last Pain:  Vitals:   06/04/19 1300  TempSrc:   PainSc: 3                  Teion Ballin

## 2019-06-04 NOTE — Anesthesia Procedure Notes (Signed)
Arterial Line Insertion Start/End8/24/2020 7:10 AM Performed by: Elayne Snare, CRNA, CRNA  Preanesthetic checklist: patient identified, IV checked, risks and benefits discussed, surgical consent and monitors and equipment checked Lidocaine 1% used for infiltration Left, radial was placed Catheter size: 20 G Hand hygiene performed  and maximum sterile barriers used  Allen's test indicative of satisfactory collateral circulation Attempts: 1 Procedure performed without using ultrasound guided technique. Following insertion, dressing applied and Biopatch. Post procedure assessment: normal  Patient tolerated the procedure well with no immediate complications.

## 2019-06-04 NOTE — Op Note (Signed)
NAME: Marissa [name]    MRN: [id number] DOB: July 18, 1966    DATE OF OPERATION: 06/04/2019  PREOP DIAGNOSIS:    Atherosclerosis of descending thoracic aorta with embolization  POSTOP DIAGNOSIS:    Same  PROCEDURE:    1.  Perclose left common femoral artery 2.  Aortogram 3. TEVAR without coverage of the left subclavian artery  SURGEON: Judeth Cornfield. Scot Dock, MD  ASSIST: Servando Snare, MD  ANESTHESIA: General  EBL: Minimal  INDICATIONS:    Marissa [name] is a 52 y.o. female who presented with abdominal pain.  A CT scan showed a significant ulcerated atherosclerotic plaque in the descending thoracic aorta which had embolized to the kidney and spleen.  Thoracic stent graft repair was recommended to lower her risk of future embolization.  FINDINGS:   Completion x-rays show the graft in excellent position.  There was no evidence of endoleak.  TECHNIQUE:   The patient was taken to the operating room and received a general anesthetic.  Both groins and abdomen were prepped and draped in usual sterile fashion.  Under ultrasound guidance, the left common femoral artery was cannulated with an 18-gauge needle and a guidewire introduced over the wire.  Initially we had problems advancing the wire so the wire and needle were removed and pressure held for hemostasis.  I cannulated the artery a second time and was able to easily pass the wire without difficulty.  The tract over the wire was dilated with an 8 [place] dilator.  A Perclose device was placed and rotated 10 degrees medially.  The second Perclose device was placed and rotated 10 degrees laterally.  8 French sheath was placed on the left.  The patient was then heparinized.  Once the patient had been heparinized we advanced the wire gently through the atherosclerotic plaque up into the aortic arch.  We then exchanged the Bentson wire for a curved Lunderquist wire.  The 8 French sheath was exchanged for the 20 [place] dry seal sheath.  This  was positioned into the infrarenal aorta.  Next the device which was a 28 mm x 28 mm x 15 cm in length thoracic stent was positioned just below the arch.  The pigtail catheter was placed after a wire was advanced through the dilator through the same sheath.  This was positioned in the arch.  Aortogram was obtained to demonstrate the position of the arch to be sure we were well above the celiac axis.  The graft was then positioned just below the arch and deployed without difficulty.  Next the pigtail was removed over a wire.  The pigtail was then advanced over the her Lunderquist wire and completion arteriogram showed an excellent result.  We then backed the sheath down and shot a retrograde shot through the sheath to demonstrate that there was no complications with the common or external iliac artery on the left.  Next the 2 Perclose devices were secured.  There appeared to be some bleeding still so I placed a third Perclose device anteriorly.  This provided good hemostasis.  The wire was removed.  All 3 Perclose devices were secured.  Pressure was held for hemostasis.  Patient's heparin was reversed with protamine.  There is a good Doppler signal in the left foot.  The sutures were then cut.  The incision was closed with a 4-0 Monocryl.  Dermabond was applied.  Patient tolerated the procedure well was transferred to the recovery room in stable condition.  All needle and sponge  counts were correct.  Deitra Mayo, MD, FACS [facility]  DATE OF DICTATION:   06/04/2019

## 2019-06-04 NOTE — Interval H&P Note (Signed)
History and Physical Interval Note:  06/04/2019 7:21 AM  Marissa [name]  has presented today for surgery, with the diagnosis of thoracic aortic aneurysm.  The various methods of treatment have been discussed with the patient and family. After consideration of risks, benefits and other options for treatment, the patient has consented to  Procedure(s): THORACIC AORTIC ENDOVASCULAR STENT GRAFT (N/A) as a surgical intervention.  The patient's history has been reviewed, patient examined, no change in status, stable for surgery.  I have reviewed the patient's chart and labs.  Questions were answered to the patient's satisfaction.     Deitra Mayo

## 2019-06-04 NOTE — Transfer of Care (Signed)
Immediate Anesthesia Transfer of Care Note  Patient: Marissa [name]  Procedure(s) Performed: THORACIC AORTIC ENDOVASCULAR STENT GRAFT (N/A Groin)  Patient Location: PACU  Anesthesia Type:General  Level of Consciousness: drowsy and patient cooperative  Airway & Oxygen Therapy: Patient Spontanous Breathing  Post-op Assessment: Report given to RN and Post -op Vital signs reviewed and stable  Post vital signs: Reviewed and stable  Last Vitals:  Vitals Value Taken Time  BP 93/56 06/04/19 0917  Temp    Pulse 97 06/04/19 0919  Resp 21 06/04/19 0919  SpO2 91 % 06/04/19 0919  Vitals shown include unvalidated device data.  Last Pain:  Vitals:   06/04/19 0623  TempSrc:   PainSc: 0-No pain         Complications: No apparent anesthesia complications

## 2019-06-05 ENCOUNTER — Other Ambulatory Visit: Payer: Self-pay

## 2019-06-05 ENCOUNTER — Telehealth: Payer: Self-pay | Admitting: Internal Medicine

## 2019-06-05 ENCOUNTER — Encounter (HOSPITAL_COMMUNITY): Payer: Self-pay | Admitting: Vascular Surgery

## 2019-06-05 LAB — BASIC METABOLIC PANEL
Anion gap: 9 (ref 5–15)
BUN: 9 mg/dL (ref 6–20)
CO2: 23 mmol/L (ref 22–32)
Calcium: 8.5 mg/dL — ABNORMAL LOW (ref 8.9–10.3)
Chloride: 104 mmol/L (ref 98–111)
Creatinine, Ser: 0.51 mg/dL (ref 0.44–1.00)
GFR calc Af Amer: >60 mL/min (ref >60)
GFR calc non Af Amer: >60 mL/min (ref >60)
Glucose, Bld: 131 mg/dL — ABNORMAL HIGH (ref 70–99)
Potassium: 3.7 mmol/L (ref 3.5–5.1)
Sodium: 136 mmol/L (ref 135–145)

## 2019-06-05 LAB — CBC
HCT: 34.3 % — ABNORMAL LOW (ref 36.0–46.0)
Hemoglobin: 11.5 g/dL — ABNORMAL LOW (ref 12.0–15.0)
MCH: 30.6 pg (ref 26.0–34.0)
MCHC: 33.5 g/dL (ref 30.0–36.0)
MCV: 91.2 fL (ref 80.0–100.0)
Platelets: 266 10*3/uL (ref 150–400)
RBC: 3.76 MIL/uL — ABNORMAL LOW (ref 3.87–5.11)
RDW: 12.7 % (ref 11.5–15.5)
WBC: 10.2 10*3/uL (ref 4.0–10.5)
nRBC: 0 % (ref 0.0–0.2)

## 2019-06-05 LAB — GLUCOSE, CAPILLARY: Glucose-Capillary: 150 mg/dL — ABNORMAL HIGH (ref 70–99)

## 2019-06-05 MED ORDER — GLUCOSE BLOOD VI STRP: ORAL_STRIP | 2 refills | Status: DC

## 2019-06-05 MED ORDER — GLUCOSE BLOOD VI STRP: ORAL_STRIP | 12 refills | Status: DC

## 2019-06-05 NOTE — Telephone Encounter (Signed)
Spoke to pt and clarified testing frequency and resent rx

## 2019-06-05 NOTE — Progress Notes (Addendum)
[facility] [place]  Subjective  - Doing well without new complaints.   Objective (!) 128/116 78 98.6 F (37 C) (Oral) 16 99%  Intake/Output Summary (Last 24 hours) at 06/05/2019 0742 Last data filed at 06/04/2019 1400 Gross per 24 hour  Intake 1150 ml  Output 1255 ml  Net -105 ml   Left groin stick site soft Feet warm and well perfused Lungs non labored breathing  Assessment/Planning: POD # 1 1.  Perclose left common femoral artery 2.  Aortogram 3. TEVAR without coverage of the left subclavian artery  Stable condition plan to discharge home She does not need pain medication, she has some at home. F/U in 4 weeks with Dr. Scot Dock CTA TEVAR protocol.  Roxy Horseman 06/05/2019 7:42 AM --  Laboratory Lab Results: Recent Labs    06/04/19 0928 06/05/19 0532  WBC 11.9* 10.2  HGB 11.2* 11.5*  HCT 34.8* 34.3*  PLT 259 266   BMET Recent Labs    06/04/19 0928 06/05/19 0532  NA 139 136  K 3.8 3.7  CL 106 104  CO2 23 23  GLUCOSE 177* 131*  BUN 12 9  CREATININE 0.56 0.51  CALCIUM 8.6* 8.5*    COAG Lab Results  Component Value Date   INR 1.0 06/04/2019   INR 1.0 05/29/2019   No results found for: PTT  I have interviewed the patient and examined the patient. I agree with the findings by the PA.  Agree with plans for discharge today.  Gae Gallop, MD [phone]

## 2019-06-05 NOTE — Discharge Instructions (Signed)
   [facility] [place]   Discharge Instructions  Endovascular Aortic Aneurysm Repair  Please refer to the following instructions for your post-procedure care. Your surgeon or Physician Assistant will discuss any changes with you.  Activity  You are encouraged to walk as much as you can. You can slowly return to normal activities but must avoid strenuous activity and heavy lifting until your doctor tells you it's OK. Avoid activities such as vacuuming or swinging a gold club. It is normal to feel tired for several weeks after your surgery. Do not drive until your doctor gives the OK and you are no longer taking prescription pain medications. It is also normal to have difficulty with sleep habits, eating, and bowel movements after surgery. These will go away with time.  Bathing/Showering  You may shower after you go home. If you have an incision, do not soak in a bathtub, hot tub, or swim until the incision heals completely.  Incision Care  Shower every day. Clean your incision with mild soap and water. Pat the area dry with a clean towel. You do not need a bandage unless otherwise instructed. Do not apply any ointments or creams to your incision. If you clothing is irritating, you may cover your incision with a dry gauze pad.  Diet  Resume your normal diet. There are no special food restrictions following this procedure. A low fat/low cholesterol diet is recommended for all patients with vascular disease. In order to heal from your surgery, it is CRITICAL to get adequate nutrition. Your body requires vitamins, minerals, and protein. Vegetables are the best source of vitamins and minerals. Vegetables also provide the perfect balance of protein. Processed food has little nutritional value, so try to avoid this.  Medications  Resume taking all of your medications unless your doctor or nurse practitioner tells you not to. If your incision is causing pain, you may take  over-the-counter pain relievers such as acetaminophen (Tylenol). If you were prescribed a stronger pain medication, please be aware these medications can cause nausea and constipation. Prevent nausea by taking the medication with a snack or meal. Avoid constipation by drinking plenty of fluids and eating foods with a high amount of fiber, such as fruits, vegetables, and grains. Do not take Tylenol if you are taking prescription pain medications.   Follow up  Our office will schedule a follow-up appointment with a C.T. scan 3-4 weeks after your surgery.  Please call us immediately for any of the following conditions  Severe or worsening pain in your legs or feet or in your abdomen back or chest. Increased pain, redness, drainage (pus) from your incision sit. Increased abdominal pain, bloating, nausea, vomiting or persistent diarrhea. Fever of 101 degrees or higher. Swelling in your leg (s),  Reduce your risk of vascular disease  Stop smoking. If you would like help call [facility] at 1-800-QUIT-NOW (1-800-784-8669) or [place] at 336-586-4000. Manage your cholesterol Maintain a desired weight Control your diabetes Keep your blood pressure down  If you have questions, please call the office at [phone].   

## 2019-06-05 NOTE — Telephone Encounter (Signed)
Before Breakfast  Two hours after Breakfast Before lunch Two hours after lunch Before Dinner Two hours after Dinner Before Bed One touch ultra mini

## 2019-06-05 NOTE — Consult Note (Signed)
   [facility] CM Inpatient Consult   06/05/2019  ROSAMOND ANDRESS 11/12/1965 [id number]    Patient waschecked forpotential [place] Management services neededunder her [facility] plan. Review of patient's medical record reveals patienthad 2 hospitalizations in the past 6 months, with 20% risk score for unplanned readmissions.  Per chart review and MD history & physical show as follows:  Marissa [name] is a pleasant 53 y.o. female, who would presented with abdominal pain to the [facility] ED. This prompted a CT of the chest and abdomen. She was found to have an ulcerated plaque in the descending thoracic aorta and was sent for vascular consultation. Patient had presented with nausea and vomiting, was felt to have diabetic ketoacidosis although she tells me this was ruled out. She does have type 2 diabetes. (Atherosclerosis of aorta, Splenic infarction,Renal infarct, Diabetic ketoacidosis without coma associated with type 2 DM)  Her primary Care Provider isDr. Redmond School with [facility], listed to provide transition of care.  Patient transitionedto home prior to speaking with her.  Will follow patient with [facility] General calls to monitor recovery.   For questions and additional information, please call:  Joaopedro Eschbach A. Clarita Mcelvain, BSN, RN-BC [facility] Liaison Cell: [phone]

## 2019-06-05 NOTE — Telephone Encounter (Signed)
Pt called needing a refill for One touch mini test strips. Pt needs Rx.  Pharmacy is [facility] [phone] - [place], [address] AT [address]  Call pt @ [phone]. Thank you!

## 2019-06-06 LAB — BASIC METABOLIC PANEL
BUN: 13 mg/dL (ref 7–25)
CO2: 23 mmol/L (ref 20–32)
Calcium: 10.1 mg/dL (ref 8.6–10.4)
Chloride: 103 mmol/L (ref 98–110)
Creat: 0.59 mg/dL (ref 0.50–1.05)
Glucose, Bld: 95 mg/dL (ref 65–99)
Potassium: 4.2 mmol/L (ref 3.5–5.3)
Sodium: 139 mmol/L (ref 135–146)

## 2019-06-06 LAB — ISLET CELL AB SCREEN RFLX TO TITER: ISLET CELL ANTIBODY SCREEN: NEGATIVE

## 2019-06-06 LAB — GLUTAMIC ACID DECARBOXYLASE AUTO ABS: Glutamic Acid Decarb Ab: <5 IU/mL (ref <5)

## 2019-06-06 NOTE — Discharge Summary (Signed)
[facility] Discharge Summary   Patient ID:  Marissa [name] MRN: [id number] DOB/AGE: 54-Mar-1967 54 y.o.  Admit date: 06/04/2019 Discharge date: 06/05/2019 Date of Surgery: 06/04/2019 Surgeon: Surgeon(s): Angelia Mould, MD Waynetta Sandy, MD  Admission Diagnosis: thoracic aortic aneurysm  Discharge Diagnoses:  thoracic aortic aneurysm  Secondary Diagnoses: Past Medical History:  Diagnosis Date  . Adenocarcinoma of lung ([place]) 02/06/2014  . Anxiety   . Arthritis   . COPD (chronic obstructive pulmonary disease) ([place])   . Coronary artery disease   . Diabetes mellitus   . GERD (gastroesophageal reflux disease)   . Hypertension   . Hypothyroidism   . Lung cancer ([place])     Procedure(s): THORACIC AORTIC ENDOVASCULAR STENT GRAFT  Discharged Condition: stable  HPI: 53 y/o female with Ulcerated plaque of descending thoracic aorta.  The consult was requested by the [facility] emergency department.  On review of the CTA this appears to have embolized to her left kidney and spleen.  She has been on Eliquis now has had no new symptoms.  She denies abdominal pain, nausea, vomiting, hematuria.  Dr. Scot Dock has recommended that she undergo thoracic stent placement to address this pathology.      Hospital Course:  Marissa [name] is a 53 y.o. female is S/P  Procedure(s): THORACIC AORTIC ENDOVASCULAR STENT GRAFT   Consults:  Treatment Team:  Waynetta Sandy, MD   Uneventful stay over night.   Left groin stick site soft Feet warm and well perfused Lungs non labored breathing Moving all 4 ext. Stable condition plan to discharge home She does not need pain medication, she has some at home. F/U in 4 weeks with Dr. Scot Dock CTA TEVAR protocol.  Significant Diagnostic Studies: CBC Lab Results  Component Value Date   WBC 10.2 06/05/2019   HGB 11.5 (L) 06/05/2019   HCT 34.3 (L) 06/05/2019   MCV 91.2 06/05/2019   PLT 266 06/05/2019     BMET    Component Value Date/Time   NA 136 06/05/2019 0532   K 3.7 06/05/2019 0532   CL 104 06/05/2019 0532   CO2 23 06/05/2019 0532   GLUCOSE 131 (H) 06/05/2019 0532   BUN 9 06/05/2019 0532   CREATININE 0.51 06/05/2019 0532   CREATININE 0.59 06/01/2019 1556   CALCIUM 8.5 (L) 06/05/2019 0532   GFRNONAA >60 06/05/2019 0532   GFRAA >60 06/05/2019 0532   COAG Lab Results  Component Value Date   INR 1.0 06/04/2019   INR 1.0 05/29/2019     Disposition:  Discharge to :Home Discharge Instructions    Call MD for:  redness, tenderness, or signs of infection (pain, swelling, bleeding, redness, odor or green/yellow discharge around incision site)   Complete by: As directed    Call MD for:  severe or increased pain, loss or decreased feeling  in affected limb(s)   Complete by: As directed    Call MD for:  temperature >100.5   Complete by: As directed    Resume previous diet   Complete by: As directed      Allergies as of 06/05/2019      Reactions   Bee Venom Anaphylaxis   Penicillins Swelling, Rash, Other (See Comments)   Did it involve swelling of the face/tongue/throat, SOB, or low BP? Yes-whole body (swelling) Did it involve sudden or severe rash/hives, skin peeling, or any reaction on the inside of your mouth or nose? Yes-Hives Did you need to seek medical attention at a hospital  or doctor's office? Yes When did it last happen?Childhood If all above answers are "NO", may proceed with cephalosporin use.   Poison Oak Extract Anaphylaxis      Medication List    TAKE these medications   albuterol-ipratropium 18-103 MCG/ACT inhaler Commonly known as: COMBIVENT Inhale 2 puffs into the lungs every 6 (six) hours as needed for wheezing or shortness of breath.   ALPRAZolam 0.5 MG tablet Commonly known as: XANAX Take 0.5 mg by mouth See admin instructions. Take 0.5 mg at bedtime every night. Take 0.5mg  once a day as needed for anxiety.   apixaban 5 MG Tabs  tablet Commonly known as: Eliquis Take 1 tablet (5 mg total) by mouth 2 (two) times daily.   aspirin EC 81 MG tablet Take 81 mg by mouth daily.   atorvastatin 80 MG tablet Commonly known as: LIPITOR Take 1 tablet (80 mg total) by mouth daily at 6 PM.   busPIRone 30 MG tablet Commonly known as: BUSPAR Take 15 mg by mouth 2 (two) times a day.   Chantix Starting Month Pak 0.5 MG X 11 & 1 MG X 42 tablet Generic drug: varenicline Take one 0.5 mg tablet by mouth once daily for 3 days, then increase to one 0.5 mg tablet twice daily for 4 days, then increase to one 1 mg tablet twice daily.   varenicline 1 MG tablet Commonly known as: Chantix Continuing Month Pak Take 1 tablet (1 mg total) by mouth 2 (two) times daily.   Doxepin HCl 6 MG Tabs Take 6 mg by mouth at bedtime.   EpiPen 2-Pak 0.3 mg/0.3 mL Soaj injection Generic drug: EPINEPHrine Inject 0.3 mg into the muscle as needed for anaphylaxis. Has available   esomeprazole 40 MG capsule Commonly known as: NEXIUM Take 1 capsule (40 mg total) by mouth daily before breakfast.   ezetimibe 10 MG tablet Commonly known as: ZETIA Take 10 mg by mouth daily.   fish oil-omega-3 fatty acids 1000 MG capsule Take 2 g by mouth 2 (two) times a day. 4 tablets daily   FreeStyle Libre 14 Day Reader Kerrin Mo See admin instructions.   FreeStyle Libre 14 Day Sensor Misc REPLACE EVERY 14 DAYS   Insulin Pen Needle 32G X 4 MM Misc 2x daily   levothyroxine 50 MCG tablet Commonly known as: SYNTHROID Take 1 tablet (50 mcg total) by mouth daily.   lisinopril 10 MG tablet Commonly known as: ZESTRIL Take 1 tablet (10 mg total) by mouth daily.   metFORMIN 500 MG tablet Commonly known as: Glucophage Take 1 tablet (500 mg total) by mouth 2 (two) times daily with a meal.   nicotine 21 mg/24hr patch Commonly known as: NICODERM CQ - dosed in mg/24 hours Place 21 mg onto the skin daily as needed (smoking cessation).   NovoLOG Mix 70/30 FlexPen  (70-30) 100 UNIT/ML FlexPen Generic drug: insulin aspart protamine - aspart Inject 0.16 mLs (16 Units total) into the skin 2 (two) times daily with a meal.   oxyCODONE 15 MG immediate release tablet Commonly known as: ROXICODONE Take 15 mg by mouth every 4 (four) hours as needed for pain.      Verbal and written Discharge instructions given to the patient. Wound care per Discharge AVS Follow-up Information    Angelia Mould, MD Follow up in 2 week(s).   Specialties: Vascular Surgery, Cardiology Why: office will call Contact information: [address] [place] [address] [phone]  Signed: Roxy Horseman 06/06/2019, 9:08 AM - For VQI Registry use --- Instructions: Press F2 to tab through selections.  Delete question if not applicable.   Post-op:  Time to Extubation: [ x] In OR, [ ]  < 12 hrs, [ ]  12-24 hrs, [ ]  >=24 hrs Vasopressors Req. Post-op: No MI: [x ] No, [ ]  Troponin only, [ ]  EKG or Clinical New Arrhythmia: No CHF: No ICU Stay: 0 days Transfusion: No  If yes, 0 units given  Complications: Resp failure: [x ] none, [ ]  Pneumonia, [ ]  Ventilator Chg in renal function: x[ ]  none, [ ]  Inc. Cr > 0.5, [ ]  Temp. Dialysis, [ ]  Permanent dialysis Leg ischemia: [x ] No, [ ]  Yes, no Surgery needed, [ ]  Yes, Surgery needed, [ ]  Amputation Bowel ischemia: [x ] No, [ ]  Medical Rx, [ ]  Surgical Rx Wound complication: [x ] No, [ ]  Superficial separation/infection, [ ]  Return to OR Return to OR: No  Return to OR for bleeding: No Stroke: [x ] None, [ ]  Minor, [ ]  Major  Discharge medications: Statin use:  Yes ASA use:  Yes Plavix use:  Yes Beta blocker use:  No  for medical reason

## 2019-06-07 ENCOUNTER — Encounter (HOSPITAL_COMMUNITY): Payer: Self-pay | Admitting: Emergency Medicine

## 2019-06-07 ENCOUNTER — Emergency Department (HOSPITAL_COMMUNITY): Payer: Medicare HMO

## 2019-06-07 ENCOUNTER — Emergency Department (HOSPITAL_COMMUNITY)
Admission: EM | Admit: 2019-06-07 | Discharge: 2019-06-07 | Disposition: A | Discharge status: Home / Self Care | Payer: Medicare HMO | Attending: Emergency Medicine | Admitting: Emergency Medicine

## 2019-06-07 ENCOUNTER — Other Ambulatory Visit: Payer: Self-pay | Admitting: *Deleted

## 2019-06-07 DIAGNOSIS — Z9889 Other specified postprocedural states: Secondary | ICD-10-CM | POA: Insufficient documentation

## 2019-06-07 DIAGNOSIS — E039 Hypothyroidism, unspecified: Secondary | ICD-10-CM | POA: Insufficient documentation

## 2019-06-07 DIAGNOSIS — N2889 Other specified disorders of kidney and ureter: Secondary | ICD-10-CM | POA: Diagnosis not present

## 2019-06-07 DIAGNOSIS — Z79899 Other long term (current) drug therapy: Secondary | ICD-10-CM | POA: Insufficient documentation

## 2019-06-07 DIAGNOSIS — M546 Pain in thoracic spine: Secondary | ICD-10-CM

## 2019-06-07 DIAGNOSIS — F17210 Nicotine dependence, cigarettes, uncomplicated: Secondary | ICD-10-CM | POA: Insufficient documentation

## 2019-06-07 DIAGNOSIS — Z9049 Acquired absence of other specified parts of digestive tract: Secondary | ICD-10-CM | POA: Diagnosis not present

## 2019-06-07 DIAGNOSIS — E119 Type 2 diabetes mellitus without complications: Secondary | ICD-10-CM | POA: Insufficient documentation

## 2019-06-07 DIAGNOSIS — I10 Essential (primary) hypertension: Secondary | ICD-10-CM | POA: Insufficient documentation

## 2019-06-07 DIAGNOSIS — R911 Solitary pulmonary nodule: Secondary | ICD-10-CM | POA: Diagnosis not present

## 2019-06-07 DIAGNOSIS — Z85118 Personal history of other malignant neoplasm of bronchus and lung: Secondary | ICD-10-CM | POA: Diagnosis not present

## 2019-06-07 DIAGNOSIS — Z794 Long term (current) use of insulin: Secondary | ICD-10-CM | POA: Insufficient documentation

## 2019-06-07 DIAGNOSIS — R69 Illness, unspecified: Secondary | ICD-10-CM | POA: Diagnosis not present

## 2019-06-07 DIAGNOSIS — R918 Other nonspecific abnormal finding of lung field: Secondary | ICD-10-CM | POA: Diagnosis not present

## 2019-06-07 DIAGNOSIS — I2510 Atherosclerotic heart disease of native coronary artery without angina pectoris: Secondary | ICD-10-CM | POA: Insufficient documentation

## 2019-06-07 DIAGNOSIS — J449 Chronic obstructive pulmonary disease, unspecified: Secondary | ICD-10-CM | POA: Diagnosis not present

## 2019-06-07 DIAGNOSIS — Z95828 Presence of other vascular implants and grafts: Secondary | ICD-10-CM | POA: Diagnosis not present

## 2019-06-07 DIAGNOSIS — Z7982 Long term (current) use of aspirin: Secondary | ICD-10-CM | POA: Insufficient documentation

## 2019-06-07 LAB — COMPREHENSIVE METABOLIC PANEL
ALT: 23 U/L (ref 0–44)
AST: 12 U/L — ABNORMAL LOW (ref 15–41)
Albumin: 3.3 g/dL — ABNORMAL LOW (ref 3.5–5.0)
Alkaline Phosphatase: 82 U/L (ref 38–126)
Anion gap: 12 (ref 5–15)
BUN: 11 mg/dL (ref 6–20)
CO2: 22 mmol/L (ref 22–32)
Calcium: 9.3 mg/dL (ref 8.9–10.3)
Chloride: 102 mmol/L (ref 98–111)
Creatinine, Ser: 0.6 mg/dL (ref 0.44–1.00)
GFR calc Af Amer: >60 mL/min (ref >60)
GFR calc non Af Amer: >60 mL/min (ref >60)
Glucose, Bld: 205 mg/dL — ABNORMAL HIGH (ref 70–99)
Potassium: 3.5 mmol/L (ref 3.5–5.1)
Sodium: 136 mmol/L (ref 135–145)
Total Bilirubin: 0.5 mg/dL (ref 0.3–1.2)
Total Protein: 6.7 g/dL (ref 6.5–8.1)

## 2019-06-07 LAB — CBC WITH DIFFERENTIAL/PLATELET
Abs Immature Granulocytes: 0.06 10*3/uL (ref 0.00–0.07)
Basophils Absolute: 0.1 10*3/uL (ref 0.0–0.1)
Basophils Relative: 0 %
Eosinophils Absolute: 0.2 10*3/uL (ref 0.0–0.5)
Eosinophils Relative: 2 %
HCT: 35.4 % — ABNORMAL LOW (ref 36.0–46.0)
Hemoglobin: 11.6 g/dL — ABNORMAL LOW (ref 12.0–15.0)
Immature Granulocytes: 1 %
Lymphocytes Relative: 16 %
Lymphs Abs: 2 10*3/uL (ref 0.7–4.0)
MCH: 30.3 pg (ref 26.0–34.0)
MCHC: 32.8 g/dL (ref 30.0–36.0)
MCV: 92.4 fL (ref 80.0–100.0)
Monocytes Absolute: 1.4 10*3/uL — ABNORMAL HIGH (ref 0.1–1.0)
Monocytes Relative: 11 %
Neutro Abs: 9.1 10*3/uL — ABNORMAL HIGH (ref 1.7–7.7)
Neutrophils Relative %: 70 %
Platelets: 294 10*3/uL (ref 150–400)
RBC: 3.83 MIL/uL — ABNORMAL LOW (ref 3.87–5.11)
RDW: 12.7 % (ref 11.5–15.5)
WBC: 12.9 10*3/uL — ABNORMAL HIGH (ref 4.0–10.5)
nRBC: 0 % (ref 0.0–0.2)

## 2019-06-07 LAB — I-STAT CREATININE, ED: Creatinine, Ser: 0.5 mg/dL (ref 0.44–1.00)

## 2019-06-07 MED ORDER — SODIUM CHLORIDE 0.9 % IV BOLUS
1000.0000 mL | Freq: Once | INTRAVENOUS | Status: AC
  Administered 2019-06-07: 1000 mL via INTRAVENOUS

## 2019-06-07 MED ORDER — IOHEXOL 350 MG/ML SOLN
100.0000 mL | Freq: Once | INTRAVENOUS | Status: AC | PRN
  Administered 2019-06-07: 100 mL via INTRAVENOUS

## 2019-06-07 MED ORDER — CYCLOBENZAPRINE HCL 10 MG PO TABS: 10.0000 mg | ORAL_TABLET | Freq: Three times a day (TID) | ORAL | 0 refills | Status: DC | PRN

## 2019-06-07 MED ORDER — HYDROMORPHONE HCL 1 MG/ML IJ SOLN
1.0000 mg | Freq: Once | INTRAMUSCULAR | Status: AC
  Administered 2019-06-07: 1 mg via INTRAVENOUS
  Filled 2019-06-07: qty 1

## 2019-06-07 NOTE — ED Triage Notes (Signed)
Pt here from home with c/o mid back pain ,pt was d/c 3 days ago after having a stent placed on the 24th

## 2019-06-07 NOTE — ED Provider Notes (Signed)
[facility] EMERGENCY DEPARTMENT Provider Note   CSN: [id number] Arrival date & time: 06/07/19  1111     History   Chief Complaint No chief complaint on file.   HPI Marissa [name] is a 53 y.o. female.     HPI 53 year old female presents with severe back pain.  On 8/24 she had surgery for ulcerated plaque of her descending thoracic aorta.  She states that that evening after surgery she noticed back pain.  It seems to be worsening.  It is severe, 10/10.  Does not radiate and is along her entire paraspinal thoracic and lumbar back.  She has been taking the 15 mg oxycodone and applied icy hot.  She had a bowel movement yesterday but no change in the pain.  Pain is worse with movements, cough, and inspiration.  She does not feel short of breath and has not had any chest pain.  No weakness or numbness. No incontinence.  Past Medical History:  Diagnosis Date  . Adenocarcinoma of lung ([place]) 02/06/2014  . Anxiety   . Arthritis   . COPD (chronic obstructive pulmonary disease) ([place])   . Coronary artery disease   . Diabetes mellitus   . GERD (gastroesophageal reflux disease)   . Hypertension   . Hypothyroidism   . Lung cancer [facility])     Patient Active Problem List   Diagnosis Date Noted  . Thoracic aortic atherosclerosis ([place]) 06/04/2019  . Dyslipidemia 06/01/2019  . Type 2 diabetes mellitus with other specified complication ([place]) 01/60/1093  . PAD (peripheral artery disease)/descending thoracic aorta plaque rupture 04/19/2019  . Anticoagulant long-term use 04/19/2019  . DKA, type 2 ([place]) 04/19/2019  . Tobacco abuse 04/19/2019  . Hyperlipidemia LDL goal <100 04/19/2019  . Xanthelasma of eyelid, bilateral/HLD 04/19/2019  . Hyperglycemia 04/17/2019  . Adenocarcinoma of lung ([place]) 02/06/2014    Past Surgical History:  Procedure Laterality Date  . APPENDECTOMY    . CHOLECYSTECTOMY    . LOBECTOMY Left 2006  . NM MYOCAR IMG MI  05/2008   lexiscan -perfusion  defect in anterior myocardium (breast attenuation); remaining myocardium with NO evidence of ischemia or infarct; EF 78%; low risk scan  . OOPHORECTOMY Left   . PARTIAL HYSTERECTOMY  1985  . SALPINGOOPHORECTOMY Left    benign 9lb mass removed and a massive wall of smaller tumors removed  . THORACIC AORTIC ENDOVASCULAR STENT GRAFT N/A 06/04/2019   Procedure: THORACIC AORTIC ENDOVASCULAR STENT GRAFT;  Surgeon: Angelia Mould, MD;  [facility] OR;  Service: Vascular;  Laterality: N/A;  . TRANSTHORACIC ECHOCARDIOGRAM  05/2008   EF =/>55%, mild MR; trace TR     OB History   No obstetric history on file.      Home Medications    Prior to Admission medications   Medication Sig Start Date End Date Taking? Authorizing Provider  albuterol-ipratropium (COMBIVENT) 18-103 MCG/ACT inhaler Inhale 2 puffs into the lungs every 6 (six) hours as needed for wheezing or shortness of breath. 04/19/19  Yes Emokpae, Courage, MD  ALPRAZolam Duanne Moron) 0.5 MG tablet Take 0.5 mg by mouth See admin instructions. Take 0.5 mg at bedtime every night. Take 0.5mg  once a day as needed for anxiety. 04/26/19  Yes [provider]  apixaban (ELIQUIS) 5 MG TABS tablet Take 1 tablet (5 mg total) by mouth 2 (two) times daily. 05/16/19  Yes Angelia Mould, MD  aspirin EC 81 MG tablet Take 81 mg by mouth daily.   Yes [provider]  atorvastatin (LIPITOR) 80 MG tablet Take 1 tablet (80 mg total) by mouth daily at 6 PM. 04/19/19  Yes Emokpae, Courage, MD  busPIRone (BUSPAR) 30 MG tablet Take 15 mg by mouth 2 (two) times a day.  02/20/19  Yes [provider]  Continuous Blood Gluc Receiver (FREESTYLE LIBRE 14 DAY READER) [place] See admin instructions. 05/04/19  Yes [provider]  Continuous Blood Gluc Sensor (FREESTYLE LIBRE 14 DAY SENSOR) MISC REPLACE EVERY 14 DAYS 05/03/19  Yes [provider]  Doxepin HCl 6 MG TABS Take 6 mg by mouth at bedtime.   Yes [provider]   esomeprazole (NEXIUM) 40 MG capsule Take 1 capsule (40 mg total) by mouth daily before breakfast. 04/19/19  Yes Emokpae, Courage, MD  ezetimibe (ZETIA) 10 MG tablet Take 10 mg by mouth daily.     Yes [provider]  fish oil-omega-3 fatty acids 1000 MG capsule Take 2 g by mouth 2 (two) times a day. 4 tablets daily    Yes [provider]  glucose blood test strip Use as instructed to test blood sugars 7 times daily E11.65 06/05/19  Yes Shamleffer, Melanie Crazier, MD  insulin aspart protamine - aspart (NOVOLOG MIX 70/30 FLEXPEN) (70-30) 100 UNIT/ML FlexPen Inject 0.16 mLs (16 Units total) into the skin 2 (two) times daily with a meal. 06/01/19  Yes Shamleffer, Melanie Crazier, MD  Insulin Pen Needle 32G X 4 MM MISC 2x daily 06/01/19  Yes Shamleffer, Melanie Crazier, MD  levothyroxine (SYNTHROID) 50 MCG tablet Take 1 tablet (50 mcg total) by mouth daily. 04/19/19  Yes Emokpae, Courage, MD  lisinopril (ZESTRIL) 10 MG tablet Take 1 tablet (10 mg total) by mouth daily. 04/19/19  Yes Roxan Hockey, MD  metFORMIN (GLUCOPHAGE) 500 MG tablet Take 1 tablet (500 mg total) by mouth 2 (two) times daily with a meal. 06/01/19 05/31/20 Yes Shamleffer, Melanie Crazier, MD  nicotine (NICODERM CQ - DOSED IN MG/24 HOURS) 21 mg/24hr patch Place 21 mg onto the skin daily as needed (smoking cessation).   Yes [provider]  oxyCODONE (ROXICODONE) 15 MG immediate release tablet Take 15 mg by mouth every 4 (four) hours as needed for pain.    Yes [provider]  cyclobenzaprine (FLEXERIL) 10 MG tablet Take 1 tablet (10 mg total) by mouth 3 (three) times daily as needed for muscle spasms. 06/07/19   Sherwood Gambler, MD  EPIPEN 2-PAK 0.3 MG/0.3ML SOAJ injection Inject 0.3 mg into the muscle as needed for anaphylaxis. Has available 01/22/15   [provider]  varenicline (CHANTIX CONTINUING MONTH PAK) 1 MG tablet Take 1 tablet (1 mg total) by mouth 2 (two) times daily. Patient not taking:  Reported on 05/24/2019 05/17/19   Roger Shelter, FNP    Family History Family History  Problem Relation Age of Onset  . Colon cancer Mother   . Lung cancer Mother   . Heart disease Mother   . Heart disease Father   . Ovarian cancer Sister   . Hepatitis Brother   . Hernia Brother     Social History Social History   Tobacco Use  . Smoking status: Current Every Day Smoker    Packs/day: 0.25    Years: 32.00    Pack years: 8.00    Types: Cigarettes  . Smokeless tobacco: Never Used  Substance Use Topics  . Alcohol use: No  . Drug use: No     Allergies   Bee venom, Penicillins, and Poison oak extract  Review of Systems Review of Systems  Constitutional: Negative for fever.  Respiratory: Positive for cough. Negative for shortness of breath.   Cardiovascular: Negative for chest pain.  Gastrointestinal: Negative for abdominal pain and vomiting.  Musculoskeletal: Positive for back pain.  Neurological: Negative for weakness and numbness.  All other systems reviewed and are negative.    Physical Exam Updated Vital Signs BP 105/68 (BP Location: Left Arm)   Pulse 86   Temp 98.3 F (36.8 C) (Oral)   Resp 16   SpO2 96%   Physical Exam Vitals signs and nursing note reviewed.  Constitutional:      General: She is not in acute distress.    Appearance: She is well-developed. She is not ill-appearing or diaphoretic.  HENT:     Head: Normocephalic and atraumatic.     Right Ear: External ear normal.     Left Ear: External ear normal.     Nose: Nose normal.  Eyes:     General:        Right eye: No discharge.        Left eye: No discharge.  Cardiovascular:     Rate and Rhythm: Normal rate and regular rhythm.     Pulses:          Radial pulses are 2+ on the right side and 2+ on the left side.       Dorsalis pedis pulses are 2+ on the right side and 2+ on the left side.     Heart sounds: Normal heart sounds.  Pulmonary:     Effort: Pulmonary effort is normal.      Breath sounds: Normal breath sounds.  Abdominal:     Palpations: Abdomen is soft.     Tenderness: There is no abdominal tenderness.  Musculoskeletal:     Thoracic back: She exhibits tenderness.     Lumbar back: She exhibits tenderness.     Comments: Diffuse midline and paraspinal (bilateral) tenderness to thoracic and superior lumbar back  Skin:    General: Skin is warm and dry.  Neurological:     Mental Status: She is alert.     Comments: 5/5 strength in BLE.  Psychiatric:        Mood and Affect: Mood is not anxious.      ED Treatments / Results  Labs (all labs ordered are listed, but only abnormal results are displayed) Labs Reviewed  COMPREHENSIVE METABOLIC PANEL - Abnormal; Notable for the following components:      Result Value   Glucose, Bld 205 (*)    Albumin 3.3 (*)    AST 12 (*)    All other components within normal limits  CBC WITH DIFFERENTIAL/PLATELET - Abnormal; Notable for the following components:   WBC 12.9 (*)    RBC 3.83 (*)    Hemoglobin 11.6 (*)    HCT 35.4 (*)    Neutro Abs 9.1 (*)    Monocytes Absolute 1.4 (*)    All other components within normal limits  I-STAT CREATININE, ED  CBG MONITORING, ED    EKG EKG Interpretation  Date/Time:  Thursday June 07 2019 11:29:28 EDT Ventricular Rate:  93 PR Interval:    QRS Duration: 101 QT Interval:  357 QTC Calculation: 444 R Axis:   121 Text Interpretation:  Sinus rhythm Right axis deviation no acute ST/T changes similar to July 2020 Confirmed by Sherwood Gambler [phone]) on 06/07/2019 11:38:32 AM   Radiology Ct Angio Chest/abd/pel For Dissection W And/or Wo Contrast  Result Date: 06/07/2019 CLINICAL DATA:  Acute onset back pain. Concern for aortic dissection. Stent graft of the thoracic aorta. EXAM: CT ANGIOGRAPHY CHEST, ABDOMEN AND PELVIS TECHNIQUE: Multidetector CT imaging through the chest, abdomen and pelvis was performed using the standard protocol during bolus administration of intravenous  contrast. Multiplanar reconstructed images and MIPs were obtained and reviewed to evaluate the vascular anatomy. CONTRAST:  133mL OMNIPAQUE IOHEXOL 350 MG/ML SOLN COMPARISON:  Chest CT 04/17/2019 FINDINGS: CTA CHEST FINDINGS Cardiovascular: Coronary artery calcification and aortic atherosclerotic calcification. Non IV contrast image demonstrates no intramural hematoma. Well-expanded thoracic aortic stent graft extends from the level of the LEFT subclavian artery to the diaphragmatic hiatus. Contrast enhanced imaging demonstrates no aortic dissection or aneurysm. Normal great vessels with typical anatomy. Normal well expanded graft. Mediastinum/Nodes: No axillary supraclavicular adenopathy. No mediastinal hilar adenopathy. No pericardial effusion. Esophagus normal. Lungs/Pleura: Ground-glass nodule at the RIGHT apex with potential 3 mm solid component (image 27/8). Atelectasis at the LEFT lung base. Musculoskeletal: No acute osseous abnormality. Review of the MIP images confirms the above findings. CTA ABDOMEN AND PELVIS FINDINGS VASCULAR Aorta: The thoracic aorta is normal caliber without evidence of dissection or aneurysm. Intimal calcification is present in the aorta and branches. Calcification primarily in the distal aortic and through the bifurcation. Celiac: Widely patent. SMA: Widely patent Renals: A dominant LEFT renal arteries patent. Two patent accessory renal arteries on the LEFT. Single renal artery on the RIGHT IMA: Widely patent Inflow: Normal Veins: Normal Review of the MIP images confirms the above findings. NON-VASCULAR Hepatobiliary: No focal hepatic lesion.  Postcholecystectomy. Pancreas: Pancreas is normal. No ductal dilatation. No pancreatic inflammation. Spleen: Normal spleen Adrenals/urinary tract: Adrenal glands normal. There is bilateral renal cortical scarring period this most severe in the pole of the RIGHT kidney. Within the lower pole of LEFT kidney there is poor cortical perfusion over a  3 cm segment (image sagittal 10.) this segment of hypoperfusion is improved from a larger renal infarction in lower pole seen on CT 04/17/2019. Stomach/Bowel: Stomach, small bowel, appendix, and cecum are normal. The colon and rectosigmoid colon are normal. Vascular/Lymphatic: No lymphadenopathy Reproductive: Uterus and ovaries normal. Other: No free fluid. Musculoskeletal: No aggressive osseous lesion. Review of the MIP images confirms the above findings. IMPRESSION: Chest Impression: 1. No aortic dissection or aneurysm in the thoracic aorta. 2. Descending thoracic aortic stent graft well positioned and well expanded. 3. Coronary artery calcification and Aortic Atherosclerosis (ICD10-I70.0). 4. Ground-glass and part solid nodule in the RIGHT upper lobe. Recommend follow-up CT without contrast of the chest in 3 to 6 months to evaluate for persistence. This recommendation follows the consensus statement: Guidelines for Management of Incidental Pulmonary Nodules Detected on CT Images: From the Fleischner Society 2017; Radiology 2017; 284:228-243. Abdomen / Pelvis Impression: 1. No aortic dissection or aneurysm of the abdominal aorta. 2. Improved perfusion to lower pole LEFT kidney consistent with resolving renal infarction. No new infarction in the kidneys or spleen. 3. Post cholecystectomy. Electronically Signed   By: Suzy Bouchard M.D.   On: 06/07/2019 14:16    Procedures Procedures (including critical care time)  Medications Ordered in ED Medications  sodium chloride 0.9 % bolus 1,000 mL (0 mLs Intravenous Stopped 06/07/19 1301)  HYDROmorphone (DILAUDID) injection 1 mg (1 mg Intravenous Given 06/07/19 1155)  iohexol (OMNIPAQUE) 350 MG/ML injection 100 mL (100 mLs Intravenous Contrast Given 06/07/19 1341)     Initial Impression / Assessment and Plan / ED Course  I have reviewed the triage  vital signs and the nursing notes.  Pertinent labs & imaging results that were available during my care of the  patient were reviewed by me and considered in my medical decision making (see chart for details).        Patient is neurovascular intact.  Given the recent surgery, CT angiography was obtained but shows no dissection or postop complication of the thoracic aorta stent.  Pain is somewhat better with IV Dilaudid.  She cannot take NSAIDs because of Eliquis.  My suspicion of an acute spinal cord emergency is pretty low.  Likely this is muscular from the way she was laying for the surgery.  I will add on a muscle relaxer to her treatment.  I made her aware of the pulmonary nodule which she is already aware of.  Follow-up with PCP but also follow-up with vascular as scheduled.  Final Clinical Impressions(s) / ED Diagnoses   Final diagnoses:  Acute bilateral thoracic back pain  Lung nodule    ED Discharge Orders         Ordered    cyclobenzaprine (FLEXERIL) 10 MG tablet  3 times daily PRN     06/07/19 1429           Sherwood Gambler, MD 06/07/19 1446

## 2019-06-07 NOTE — ED Notes (Signed)
Pt dc'd home with all belongings, driven home by spouse

## 2019-06-07 NOTE — Discharge Instructions (Addendum)
If you develop worsening, recurrent, or continued back pain, numbness or weakness in the legs, incontinence of your bowels or bladders, numbness of your buttocks, fever, abdominal pain, or any other new/concerning symptoms then return to the ER for evaluation.   You have a lung nodule seen on your CT scan today.  You will need to follow-up with your primary care doctor for a repeat CT scan in the next 3-6 months.

## 2019-06-07 NOTE — ED Notes (Signed)
Dr. Goldston at bedside.  

## 2019-06-07 NOTE — Patient Outreach (Signed)
Received Red EMMI for 06/06/19 "pt does not know who to call for changes in condition",  Noted pt is at ED for back pain, will continue to follow.  [name] [facility], [place] Coordinator [phone]

## 2019-06-08 ENCOUNTER — Other Ambulatory Visit: Payer: Self-pay | Admitting: *Deleted

## 2019-06-08 NOTE — Patient Outreach (Addendum)
Red EMMI alert received for 06/06/19- " pt does not know who to call for health condition", pt to ED on 06/07/19 for back pain, RN CM spoke with pt, HIPAA verified, pt states she went to ED for back pain and diagnosed with muscle spasms and prescribed flexeril which is providing relief per pt, pt with diagnosis aortic atherosclerosis with thoracic stent placement, DM type 2, PAD.  Pt states she generally knows who to call " it's just getting someone to call me back"  Pt states that is why she went to ED.  Pt states her CBG recently had gone up due to other health issues and is trending back down with reading today 178 (was 200's range, sometimes near 300)  Pt states she is awaiting call back from diabetes / nutrition in [facility] and will be taking classes/ meeting with dietician.  Pt feels she is able to learn better from one on one with dietician, pt states she knows about carbohydrates and what to eat and feels going to the classes face to face will be beneficial.  RN CM mailed successful outreach letter to pt home with "Know before you go" poster, 24 hour nurse line magnet and [facility] pamphlet for future reference.  RN CM ask Marissa [name] to call for any questions, concerns.  [name] [facility], [place] Coordinator [phone]

## 2019-06-11 ENCOUNTER — Other Ambulatory Visit: Payer: Self-pay | Admitting: Vascular Surgery

## 2019-06-11 DIAGNOSIS — I10 Essential (primary) hypertension: Secondary | ICD-10-CM | POA: Diagnosis not present

## 2019-06-11 DIAGNOSIS — I700 Atherosclerosis of aorta: Secondary | ICD-10-CM

## 2019-06-11 DIAGNOSIS — I739 Peripheral vascular disease, unspecified: Secondary | ICD-10-CM

## 2019-06-11 DIAGNOSIS — E1165 Type 2 diabetes mellitus with hyperglycemia: Secondary | ICD-10-CM | POA: Diagnosis not present

## 2019-06-11 DIAGNOSIS — E782 Mixed hyperlipidemia: Secondary | ICD-10-CM | POA: Diagnosis not present

## 2019-07-06 ENCOUNTER — Other Ambulatory Visit: Payer: Self-pay | Admitting: Vascular Surgery

## 2019-07-06 ENCOUNTER — Ambulatory Visit
Admission: RE | Admit: 2019-07-06 | Discharge: 2019-07-06 | Disposition: A | Discharge status: Home / Self Care | Payer: Medicare HMO | Source: Ambulatory Visit | Attending: Vascular Surgery | Admitting: Vascular Surgery

## 2019-07-06 DIAGNOSIS — I739 Peripheral vascular disease, unspecified: Secondary | ICD-10-CM

## 2019-07-06 DIAGNOSIS — I700 Atherosclerosis of aorta: Secondary | ICD-10-CM

## 2019-07-09 ENCOUNTER — Encounter: Payer: Medicare HMO | Attending: Internal Medicine | Admitting: Nutrition

## 2019-07-09 ENCOUNTER — Other Ambulatory Visit: Payer: Self-pay

## 2019-07-09 ENCOUNTER — Encounter: Payer: Self-pay | Admitting: Nutrition

## 2019-07-09 VITALS — Ht 65.0 in | Wt 157.0 lb

## 2019-07-09 DIAGNOSIS — E785 Hyperlipidemia, unspecified: Secondary | ICD-10-CM | POA: Diagnosis not present

## 2019-07-09 DIAGNOSIS — E1169 Type 2 diabetes mellitus with other specified complication: Secondary | ICD-10-CM | POA: Diagnosis not present

## 2019-07-09 NOTE — Progress Notes (Signed)
Medical Nutrition Therapy:  Appt start time: 1330 end time:  1430.  Assessment:  Primary concerns today: Diabetes Type 2. LIves with her husband and she does thc cooking andshopping.  16 units of 70/30 twice a day.  Sees  Dr. Kelton Pillar, Endocrinology in [place]. Metformin 500 mg BID.   She eats very sporadic. No meal schedule. Grazes throughout the day. Takes insulin as scheduled. BS have been 200-300's. Denies sweets. Drinks diet sodas and crystal light. She is willing to make changes with diet and exercise to improve BS's.  CMP Latest Ref Rng & Units 06/07/2019 06/07/2019 06/05/2019  Glucose 70 - 99 mg/dL - 205(H) 131(H)  BUN 6 - 20 mg/dL - 11 9  Creatinine 0.44 - 1.00 mg/dL 0.50 0.60 0.51  Sodium 135 - 145 mmol/L - 136 136  Potassium 3.5 - 5.1 mmol/L - 3.5 3.7  Chloride 98 - 111 mmol/L - 102 104  CO2 22 - 32 mmol/L - 22 23  Calcium 8.9 - 10.3 mg/dL - 9.3 8.5(L)  Total Protein 6.5 - 8.1 g/dL - 6.7 -  Total Bilirubin 0.3 - 1.2 mg/dL - 0.5 -  Alkaline Phos 38 - 126 U/L - 82 -  AST 15 - 41 U/L - 12(L) -  ALT 0 - 44 U/L - 23 -    . Lab Results  Component Value Date   HGBA1C 10.8 (A) 06/01/2019   Lipid Panel     Component Value Date/Time   CHOL 228 (H) 04/17/2019 2034   TRIG 258 (H) 04/17/2019 2034   HDL 24 (L) 04/17/2019 2034   CHOLHDL 9.5 04/17/2019 2034   VLDL 52 (H) 04/17/2019 2034   LDLCALC 152 (H) 04/17/2019 2034    Preferred Learning Style:    No preference indicated   Learning Readiness:   Ready  Change in progress   MEDICATIONS:   DIETARY INTAKE:  24-hr recall:  B ( AM): spinach. Onions, cheese omelet, 1/2 avacado and berries. Snk ( AM):  L ( PM):  Tuna salad with avacado, Diet MT Dew or Crystals LIght  Snk ( PM):  Nuts, or granola bars D ( PM): Salmon and shrimp,  Flavored water Snk ( PM):Keto  ice cream, jerky Beverages: DIet sodas, water, crystal light.  Usual physical activity: walk in yard and walks dog.   Estimated energy needs: 1200-1500  calories g carbohydrates 135 g protein 90 g fat  Progress Towards Goal(s):  In progress.   Nutritional Diagnosis:  NB-1.1 Food and nutrition-related knowledge deficit As related to Diabetes Type 2.  As evidenced by A1C 10.8%.    Intervention:  Nutrition and Diabetes education provided on My Plate, CHO counting, meal planning, portion sizes, timing of meals, avoiding snacks between meals unless having a low blood sugar, target ranges for A1C and blood sugars, signs/symptoms and treatment of hyper/hypoglycemia, monitoring blood sugars, taking medications as prescribed, benefits of exercising 30 minutes per day and prevention of complications of DM.  Goals  Follow My Plate  Eat meals on time  Cut out snacks  Drink only water Call MD if BS are > 300 in 3 days Keep food journal and BS log and  Bring back in 1 week.  Teaching Method Utilized:  Visual Auditory Hands on  Handouts given during visit include:  The Plate Method   Meal Plan Card  Diabetes Instructions.   Barriers to learning/adherence to lifestyle change: none  Demonstrated degree of understanding via:  Teach Back   Monitoring/Evaluation:  Dietary intake, exercise, ,  and body weight in 1 week.

## 2019-07-09 NOTE — Patient Instructions (Signed)
Goals  Follow My Plate  Eat meals on time  Cut out snacks  Drink only water Call MD if BS are > 300 in 3 days.

## 2019-07-11 ENCOUNTER — Other Ambulatory Visit: Payer: Self-pay | Admitting: Vascular Surgery

## 2019-07-11 ENCOUNTER — Ambulatory Visit: Payer: Medicare HMO | Admitting: Vascular Surgery

## 2019-07-11 DIAGNOSIS — E1165 Type 2 diabetes mellitus with hyperglycemia: Secondary | ICD-10-CM | POA: Diagnosis not present

## 2019-07-11 DIAGNOSIS — I10 Essential (primary) hypertension: Secondary | ICD-10-CM | POA: Diagnosis not present

## 2019-07-11 DIAGNOSIS — I739 Peripheral vascular disease, unspecified: Secondary | ICD-10-CM

## 2019-07-11 DIAGNOSIS — R69 Illness, unspecified: Secondary | ICD-10-CM | POA: Diagnosis not present

## 2019-07-11 DIAGNOSIS — E7849 Other hyperlipidemia: Secondary | ICD-10-CM | POA: Diagnosis not present

## 2019-07-13 ENCOUNTER — Other Ambulatory Visit: Payer: Medicare HMO

## 2019-07-16 ENCOUNTER — Ambulatory Visit: Payer: Medicare HMO | Admitting: Nutrition

## 2019-07-16 ENCOUNTER — Telehealth: Payer: Self-pay | Admitting: Internal Medicine

## 2019-07-16 NOTE — Telephone Encounter (Signed)
Spoke to pt and blood sugars have been as follows: 10/5 211 a.m. 10/4 211 a.m. 145 lunch 168 dinner 10/3 181 a.m. 214 lunch 164 dinner 126 bed  pt currently taking novolog 16 units BID and metformin 500 mg BID please advise

## 2019-07-16 NOTE — Telephone Encounter (Signed)
Patient called to advise that her blood sugar has not gone down in the last week. Patient states that at her appointment with Kieth Brightly (Nutrition in [place]) she was advise top call if blood sugars did not come down.  Did have a stent placed at the 1st of August    Patient has requested a call back top advise how to lower her blood sugar Phone # [phone]

## 2019-07-16 NOTE — Telephone Encounter (Signed)
Pt made aware of changes also pt stated that she has upcoming appt. on 07/24/19

## 2019-07-16 NOTE — Telephone Encounter (Signed)
lft vm for pt to return call to provide recent blood sugar readings

## 2019-07-20 ENCOUNTER — Other Ambulatory Visit: Payer: Self-pay

## 2019-07-23 DIAGNOSIS — Z1211 Encounter for screening for malignant neoplasm of colon: Secondary | ICD-10-CM | POA: Diagnosis not present

## 2019-07-23 DIAGNOSIS — G894 Chronic pain syndrome: Secondary | ICD-10-CM | POA: Diagnosis not present

## 2019-07-23 DIAGNOSIS — E1140 Type 2 diabetes mellitus with diabetic neuropathy, unspecified: Secondary | ICD-10-CM | POA: Diagnosis not present

## 2019-07-23 DIAGNOSIS — E663 Overweight: Secondary | ICD-10-CM | POA: Diagnosis not present

## 2019-07-23 DIAGNOSIS — I10 Essential (primary) hypertension: Secondary | ICD-10-CM | POA: Diagnosis not present

## 2019-07-23 DIAGNOSIS — Z6826 Body mass index (BMI) 26.0-26.9, adult: Secondary | ICD-10-CM | POA: Diagnosis not present

## 2019-07-24 ENCOUNTER — Other Ambulatory Visit: Payer: Self-pay

## 2019-07-24 ENCOUNTER — Encounter: Payer: Self-pay | Admitting: Internal Medicine

## 2019-07-24 ENCOUNTER — Ambulatory Visit (INDEPENDENT_AMBULATORY_CARE_PROVIDER_SITE_OTHER): Payer: Medicare HMO | Admitting: Internal Medicine

## 2019-07-24 VITALS — BP 122/68 | HR 92 | Temp 97.9°F | Ht 65.0 in | Wt 158.6 lb

## 2019-07-24 DIAGNOSIS — E1159 Type 2 diabetes mellitus with other circulatory complications: Secondary | ICD-10-CM | POA: Diagnosis not present

## 2019-07-24 DIAGNOSIS — Z794 Long term (current) use of insulin: Secondary | ICD-10-CM | POA: Diagnosis not present

## 2019-07-24 DIAGNOSIS — E1165 Type 2 diabetes mellitus with hyperglycemia: Secondary | ICD-10-CM | POA: Insufficient documentation

## 2019-07-24 DIAGNOSIS — E1169 Type 2 diabetes mellitus with other specified complication: Secondary | ICD-10-CM

## 2019-07-24 DIAGNOSIS — E1142 Type 2 diabetes mellitus with diabetic polyneuropathy: Secondary | ICD-10-CM | POA: Diagnosis not present

## 2019-07-24 DIAGNOSIS — E119 Type 2 diabetes mellitus without complications: Secondary | ICD-10-CM | POA: Insufficient documentation

## 2019-07-24 MED ORDER — NOVOLOG MIX 70/30 FLEXPEN (70-30) 100 UNIT/ML ~~LOC~~ SUPN: PEN_INJECTOR | SUBCUTANEOUS | 11 refills | Status: DC

## 2019-07-24 NOTE — Progress Notes (Signed)
Name: Marissa [name]  Age/ Sex: 53 y.o., female   MRN/ DOB: [id number], 05-26-1966     PCP: Redmond School, MD   Reason for Endocrinology Evaluation: Type 2 Diabetes Mellitus  Initial Endocrine Consultative Visit: 06/04/2019    PATIENT IDENTIFIER: [name] is a 53 y.o. female with a past medical history of HTN,T2DM, Dyslipidemia and Lung Ca (2006) . The patient has followed with Endocrinology clinic since 06/03/2001 for consultative assistance with management of her diabetes.  DIABETIC HISTORY:  Marissa [name] was diagnosed with T2DM in 2006. She has been on oral glycemic agents in the past ( Glipizide, glimepiride, metformin, Farxiga ( not covered) , insulin added in 01/2019. Her hemoglobin A1c has ranged from 6.7% in 2015, peaking at 13.8% in 02/2019.   On her initial visit to our clinic her A1c was 10.8%  , she was on Glimepiride, Metformin and Levemir. We stopped Levemir, and Glimepiride , started insulin Mix and reduced metformin dose due to nausea and diarrhea.   SUBJECTIVE:   During the last visit (06/04/2019): A1c 10.8%. We stopped Levemir, and Glimepiride , started insulin Mix and reduced metformin dose due to nausea and diarrhea.  Today (07/24/2019): [name] is here for a 2 month follow up on diabetes management. She checks her blood sugars 2-3  times daily, preprandial to breakfast and supper. The patient has not had hypoglycemic episodes since the last clinic visit. Otherwise, the patient has not required any recent emergency interventions for hypoglycemia but has had recent hospitalizations for aortic aneurysm ,S/P thoracic aortic stent graft  05/2019    ROS: As per HPI and as detailed below: Review of Systems  Constitutional: Negative for chills and fever.  HENT: Negative for congestion and sore throat.   Cardiovascular: Negative for chest pain and palpitations.  Gastrointestinal: Negative for diarrhea and nausea.      HOME DIABETES REGIMEN:  Novolog Mix 18  units BID  Metformin 1 tablet BID      METER DOWNLOAD SUMMARY: Date range evaluated: 9/30-10/13/20 Fingerstick Blood Glucose Tests = 47 Average Number Tests/Day = 3.4 Overall Mean FS Glucose = 212   BG Ranges: Low = 126 High = 376   Hypoglycemic Events/30 Days: BG < 50 = 0 Episodes of symptomatic severe hypoglycemia = 0    DIABETIC COMPLICATIONS: Microvascular complications:   Neuropathy   Denies: CKD , retinopathy   Last eye exam: Completed 04/2018  Macrovascular complications:   CAD ( Pending PCI next week)   Denies:  PVD, CVA   HISTORY:  Past Medical History:  Past Medical History:  Diagnosis Date  . Adenocarcinoma of lung ([place]) 02/06/2014  . Anxiety   . Arthritis   . COPD (chronic obstructive pulmonary disease) ([place])   . Coronary artery disease   . Diabetes mellitus   . GERD (gastroesophageal reflux disease)   . Hypertension   . Hypothyroidism   . Lung cancer [facility])    Past Surgical History:  Past Surgical History:  Procedure Laterality Date  . APPENDECTOMY    . CHOLECYSTECTOMY    . LOBECTOMY Left 2006  . NM MYOCAR IMG MI  05/2008   lexiscan -perfusion defect in anterior myocardium (breast attenuation); remaining myocardium with NO evidence of ischemia or infarct; EF 78%; low risk scan  . OOPHORECTOMY Left   . PARTIAL HYSTERECTOMY  1985  . SALPINGOOPHORECTOMY Left    benign 9lb mass removed and a massive wall of smaller tumors removed  . THORACIC AORTIC ENDOVASCULAR STENT  GRAFT N/A 06/04/2019   Procedure: THORACIC AORTIC ENDOVASCULAR STENT GRAFT;  Surgeon: Angelia Mould, MD;  Location: [facility] OR;  Service: Vascular;  Laterality: N/A;  . TRANSTHORACIC ECHOCARDIOGRAM  05/2008   EF =/>55%, mild MR; trace TR    Social History:  reports that she has been smoking cigarettes. She has a 8.00 pack-year smoking history. She has never used smokeless tobacco. She reports that she does not drink alcohol or use drugs. Family History:  Family History   Problem Relation Age of Onset  . Colon cancer Mother   . Lung cancer Mother   . Heart disease Mother   . Heart disease Father   . Ovarian cancer Sister   . Hepatitis Brother   . Hernia Brother      HOME MEDICATIONS: Allergies as of 07/24/2019      Reactions   Bee Venom Anaphylaxis   Penicillins Swelling, Rash, Other (See Comments)   Did it involve swelling of the face/tongue/throat, SOB, or low BP? Yes-whole body (swelling) Did it involve sudden or severe rash/hives, skin peeling, or any reaction on the inside of your mouth or nose? Yes-Hives Did you need to seek medical attention at a hospital or doctor's office? Yes When did it last happen?Childhood If all above answers are "NO", may proceed with cephalosporin use.   Poison Oak Extract Anaphylaxis      Medication List       Accurate as of July 24, 2019  3:27 PM. If you have any questions, ask your nurse or doctor.        albuterol-ipratropium 18-103 MCG/ACT inhaler Commonly known as: COMBIVENT Inhale 2 puffs into the lungs every 6 (six) hours as needed for wheezing or shortness of breath.   ALPRAZolam 0.5 MG tablet Commonly known as: XANAX Take 0.5 mg by mouth See admin instructions. Take 0.5 mg at bedtime every night. Take 0.5mg  once a day as needed for anxiety.   aspirin EC 81 MG tablet Take 81 mg by mouth daily.   atorvastatin 80 MG tablet Commonly known as: LIPITOR Take 1 tablet (80 mg total) by mouth daily at 6 PM.   busPIRone 30 MG tablet Commonly known as: BUSPAR Take 15 mg by mouth 2 (two) times a day.   cyclobenzaprine 10 MG tablet Commonly known as: FLEXERIL Take 1 tablet (10 mg total) by mouth 3 (three) times daily as needed for muscle spasms.   Doxepin HCl 6 MG Tabs Take 6 mg by mouth at bedtime.   Eliquis 5 MG Tabs tablet Generic drug: apixaban TAKE 1 TABLET(5 MG) BY MOUTH TWICE DAILY   EpiPen 2-Pak 0.3 mg/0.3 mL Soaj injection Generic drug: EPINEPHrine Inject 0.3 mg into the  muscle as needed for anaphylaxis. Has available   esomeprazole 40 MG capsule Commonly known as: NEXIUM Take 1 capsule (40 mg total) by mouth daily before breakfast.   ezetimibe 10 MG tablet Commonly known as: ZETIA Take 10 mg by mouth daily.   fish oil-omega-3 fatty acids 1000 MG capsule Take 2 g by mouth 2 (two) times a day. 4 tablets daily   FreeStyle Libre 14 Day Reader Kerrin Mo See admin instructions.   FreeStyle Libre 14 Day Sensor Misc REPLACE EVERY 14 DAYS   glucose blood test strip Use as instructed to test blood sugars 7 times daily E11.65   Insulin Pen Needle 32G X 4 MM Misc 2x daily   levothyroxine 50 MCG tablet Commonly known as: SYNTHROID Take 1 tablet (50 mcg total) by mouth daily.  lisinopril 10 MG tablet Commonly known as: ZESTRIL Take 1 tablet (10 mg total) by mouth daily.   metFORMIN 500 MG tablet Commonly known as: Glucophage Take 1 tablet (500 mg total) by mouth 2 (two) times daily with a meal.   nicotine 21 mg/24hr patch Commonly known as: NICODERM CQ - dosed in mg/24 hours Place 21 mg onto the skin daily as needed (smoking cessation).   NovoLOG Mix 70/30 FlexPen (70-30) 100 UNIT/ML FlexPen Generic drug: insulin aspart protamine - aspart Inject 0.18 mLs (18 Units total) into the skin daily with breakfast AND 0.22 mLs (22 Units total) daily with supper. What changed: See the new instructions. Changed by: Dorita Sciara, MD   oxyCODONE 15 MG immediate release tablet Commonly known as: ROXICODONE Take 15 mg by mouth every 4 (four) hours as needed for pain.   varenicline 1 MG tablet Commonly known as: Chantix Continuing Month Pak Take 1 tablet (1 mg total) by mouth 2 (two) times daily.        OBJECTIVE:   Vital Signs: BP 122/68 (BP Location: Left Arm, Patient Position: Sitting, Cuff Size: Normal)   Pulse 92   Temp 97.9 F (36.6 C)   Ht 5\' 5"  (1.651 m)   Wt 158 lb 9.6 oz (71.9 kg)   SpO2 97%   BMI 26.39 kg/m   Wt Readings from  Last 3 Encounters:  07/24/19 158 lb 9.6 oz (71.9 kg)  07/09/19 157 lb (71.2 kg)  06/04/19 160 lb (72.6 kg)     Exam: General: Pt appears well and is in NAD  Neck: General: Supple without adenopathy. Thyroid: Thyroid size normal.  No goiter or nodules appreciated. No thyroid bruit.  Lungs: Clear with good BS bilat with no rales, rhonchi, or wheezes  Heart: RRR with normal S1 and S2 and no gallops; no murmurs; no rub  Abdomen: Normoactive bowel sounds, soft, nontender, without masses or organomegaly palpable  Extremities: No pretibial edema. No tremor. Normal strength and motion throughout. See detailed diabetic foot exam below.  Skin: Normal texture and temperature to palpation. No rash noted. No Acanthosis nigricans/skin tags. No lipohypertrophy.  Neuro: MS is good with appropriate affect, pt is alert and Ox3       DM foot exam: 06/01/19  The skin of the feet is intact without sores or ulcerations. The pedal pulses are 2+ on right and 2+ on left. The sensation is decreased to a screening 5.07, 10 gram monofilament bilaterally   DATA REVIEWED:  Lab Results  Component Value Date   HGBA1C 10.8 (A) 06/01/2019   HGBA1C 11.1 (H) 05/29/2019   HGBA1C 12.4 (H) 04/17/2019   Lab Results  Component Value Date   LDLCALC 152 (H) 04/17/2019   CREATININE 0.50 06/07/2019    Lab Results  Component Value Date   CHOL 228 (H) 04/17/2019   HDL 24 (L) 04/17/2019   LDLCALC 152 (H) 04/17/2019   TRIG 258 (H) 04/17/2019   CHOLHDL 9.5 04/17/2019         ASSESSMENT / PLAN / RECOMMENDATIONS:   1) Type 2 Diabetes Mellitus, Poorly controlled, With Neuropathic and Macrovascular  complications - Most recent A1c of 10.8 %. Goal A1c < 7.0 %.    - Pt continues with Hyperglycemia , unfortunately with her eating habits, it will be difficult to optimize her glucose control.  - Pt admits to grazing during the day, she would eat her first meal around 1 pm but at night she tends to stack her  meals/snacks  hence her hyperglycemia worse in the morning, will adjust insulin as below    MEDICATIONS:  Continue Metformin 500 mg BID  Novolog Mix 18 units with Breakfast and 22 units with Supper   EDUCATION / INSTRUCTIONS:  BG monitoring instructions: Patient is instructed to check her blood sugars 2 times a day, breakfast and supper.  Call [place] [facility] if: BG persistently < 70 or > 300. . I reviewed the Rule of 15 for the treatment of hypoglycemia in detail with the patient. Literature supplied.     F/U in 3 months    Signed electronically by: Mack Guise, MD  [facility] Endocrinology  [facility] [address] [place], [place] [address] Phone: [phone] FAX: [phone]   CC: [address], [place] [address] Phone: [phone]  Fax: [phone]  Return to Endocrinology clinic as below: Future Appointments  Date Time Provider [place]  08/01/2019  9:00 AM Angelia Mould, MD [facility]  08/08/2019  2:45 PM Tamsen Roers D, RD [facility] None  10/24/2019  1:20 PM Shyler Holzman, Melanie Crazier, MD [facility] None  11/15/2019 12:10 PM [facility] LAB [facility] None  11/15/2019  1:00 PM AP-CT 1 AP-CT [place] [facility]  11/19/2019 11:10 AM Roger Shelter, FNP [facility] None

## 2019-07-24 NOTE — Patient Instructions (Signed)
 -   Metformin ONE tablet with Breakfast and ONE tablet with Supper  - Novolog Mix 18 units with Breakfast and 22 units with Supper   - Check sugar before breakfast and Supper    -HOW TO TREAT LOW BLOOD SUGARS (Blood sugar LESS THAN 70 MG/DL)  Please follow the RULE OF 15 for the treatment of hypoglycemia treatment (when your (blood sugars are less than 70 mg/dL)    STEP 1: Take 15 grams of carbohydrates when your blood sugar is low, which includes:   3-4 GLUCOSE TABS  OR  3-4 OZ OF JUICE OR REGULAR SODA OR  ONE TUBE OF GLUCOSE GEL     STEP 2: RECHECK blood sugar in 15 MINUTES STEP 3: If your blood sugar is still low at the 15 minute recheck --> then, go back to STEP 1 and treat AGAIN with another 15 grams of carbohydrates.

## 2019-08-01 ENCOUNTER — Ambulatory Visit: Payer: Medicare HMO | Admitting: Vascular Surgery

## 2019-08-07 DIAGNOSIS — R69 Illness, unspecified: Secondary | ICD-10-CM | POA: Diagnosis not present

## 2019-08-08 ENCOUNTER — Other Ambulatory Visit: Payer: Self-pay

## 2019-08-08 ENCOUNTER — Encounter: Payer: Self-pay | Admitting: Nutrition

## 2019-08-08 ENCOUNTER — Encounter: Payer: Medicare HMO | Attending: Internal Medicine | Admitting: Nutrition

## 2019-08-08 DIAGNOSIS — E785 Hyperlipidemia, unspecified: Secondary | ICD-10-CM | POA: Diagnosis not present

## 2019-08-08 DIAGNOSIS — E1169 Type 2 diabetes mellitus with other specified complication: Secondary | ICD-10-CM

## 2019-08-08 NOTE — Patient Instructions (Addendum)
Follow up 3 months  Goals Keep up the great job! Eat meals on  As much as possible. Keep drinking more water and less diet mt dew Keep walking Goal is am BS less than 130 in am and less than 150 before supper. Get A1C to 7% or less.

## 2019-08-08 NOTE — Progress Notes (Signed)
Medical Nutrition Therapy:  Appt start time: 1400end time:  1430.  Assessment:  Primary concerns today: Diabetes Type 2.  Insulin increase of 70/30 per Dr. Kelton Pillar to 18 units of 70/30  In am 22 units with dinner.  Metformin 500 mg BID. Sees Dr. Kelton Pillar Walking some during day. Cutting down on diet sodas. FBS's 150-200's. Still smoking 1/2 pack a day but in process of cutting back Hyperlipidemia. Is on Zetia and Lipitor.  Has cut out fried and processed food and eating a lot more lower carb vegetables. Doing much better with diet. Cutting down on diet sodas. She notes she feels better. Will get A1C at next appt with DR. Shamleffer.  CMP Latest Ref Rng & Units 06/07/2019 06/07/2019 06/05/2019  Glucose 70 - 99 mg/dL - 205(H) 131(H)  BUN 6 - 20 mg/dL - 11 9  Creatinine 0.44 - 1.00 mg/dL 0.50 0.60 0.51  Sodium 135 - 145 mmol/L - 136 136  Potassium 3.5 - 5.1 mmol/L - 3.5 3.7  Chloride 98 - 111 mmol/L - 102 104  CO2 22 - 32 mmol/L - 22 23  Calcium 8.9 - 10.3 mg/dL - 9.3 8.5(L)  Total Protein 6.5 - 8.1 g/dL - 6.7 -  Total Bilirubin 0.3 - 1.2 mg/dL - 0.5 -  Alkaline Phos 38 - 126 U/L - 82 -  AST 15 - 41 U/L - 12(L) -  ALT 0 - 44 U/L - 23 -    . Lab Results  Component Value Date   HGBA1C 10.8 (A) 06/01/2019   Lipid Panel     Component Value Date/Time   CHOL 228 (H) 04/17/2019 2034   TRIG 258 (H) 04/17/2019 2034   HDL 24 (L) 04/17/2019 2034   CHOLHDL 9.5 04/17/2019 2034   VLDL 52 (H) 04/17/2019 2034   LDLCALC 152 (H) 04/17/2019 2034    Preferred Learning Style:    No preference indicated   Learning Readiness:   Ready  Change in progress   MEDICATIONS:   DIETARY INTAKE:  24-hr recall:  B 10-11 ( AM): pancake  avacado and berries. Snk ( AM):  L ( PM):  Protein shake for DM.  Snk ( PM):  Nuts, or granola bars D ( PM)  99 mg/dl  Cauliforwer , broccoli and carrrts , asparagus, chicken, water Snk ( PM): Beverages water, DIet MT Dew - but not as much.  Usual  physical activity: walk in yard and walks dog.   Estimated energy needs: 1200-1500 calories g carbohydrates 135 g protein 90 g fat  Progress Towards Goal(s):  In progress.   Nutritional Diagnosis:  NB-1.1 Food and nutrition-related knowledge deficit As related to Diabetes Type 2.  As evidenced by A1C 10.8%.    Intervention:  Nutrition and Diabetes education provided on My Plate, CHO counting, meal planning, portion sizes, timing of meals, avoiding snacks between meals unless having a low blood sugar, target ranges for A1C and blood sugars, signs/symptoms and treatment of hyper/hypoglycemia, monitoring blood sugars, taking medications as prescribed, benefits of exercising 30 minutes per day and prevention of complications of DM. Follow up 3 months  Goals Keep up the great job! Eat meals on  As much as possible. Keep drinking more water and less diet mt dew Keep walking Goal is am BS less than 130 in am and less than 150 before supper. Get A1C to 7% or less.  .  Teaching Method Utilized:  Visual Auditory Hands on  Handouts given during visit include:  The Plate Method  Meal Plan Card  Diabetes Instructions.   Barriers to learning/adherence to lifestyle change: none  Demonstrated degree of understanding via:  Teach Back   Monitoring/Evaluation:  Dietary intake, exercise, , and body weight in 3 months.

## 2019-08-11 DIAGNOSIS — J45909 Unspecified asthma, uncomplicated: Secondary | ICD-10-CM | POA: Diagnosis not present

## 2019-08-11 DIAGNOSIS — E1140 Type 2 diabetes mellitus with diabetic neuropathy, unspecified: Secondary | ICD-10-CM | POA: Diagnosis not present

## 2019-08-11 DIAGNOSIS — E1165 Type 2 diabetes mellitus with hyperglycemia: Secondary | ICD-10-CM | POA: Diagnosis not present

## 2019-08-11 DIAGNOSIS — J449 Chronic obstructive pulmonary disease, unspecified: Secondary | ICD-10-CM | POA: Diagnosis not present

## 2019-09-03 DIAGNOSIS — R69 Illness, unspecified: Secondary | ICD-10-CM | POA: Diagnosis not present

## 2019-09-17 DIAGNOSIS — Z1389 Encounter for screening for other disorder: Secondary | ICD-10-CM | POA: Diagnosis not present

## 2019-09-17 DIAGNOSIS — I10 Essential (primary) hypertension: Secondary | ICD-10-CM | POA: Diagnosis not present

## 2019-09-17 DIAGNOSIS — Z23 Encounter for immunization: Secondary | ICD-10-CM | POA: Diagnosis not present

## 2019-09-17 DIAGNOSIS — E119 Type 2 diabetes mellitus without complications: Secondary | ICD-10-CM | POA: Diagnosis not present

## 2019-09-17 DIAGNOSIS — E782 Mixed hyperlipidemia: Secondary | ICD-10-CM | POA: Diagnosis not present

## 2019-09-17 DIAGNOSIS — Z6827 Body mass index (BMI) 27.0-27.9, adult: Secondary | ICD-10-CM | POA: Diagnosis not present

## 2019-09-17 DIAGNOSIS — Z0001 Encounter for general adult medical examination with abnormal findings: Secondary | ICD-10-CM | POA: Diagnosis not present

## 2019-09-19 DIAGNOSIS — Z1389 Encounter for screening for other disorder: Secondary | ICD-10-CM | POA: Diagnosis not present

## 2019-09-19 DIAGNOSIS — E782 Mixed hyperlipidemia: Secondary | ICD-10-CM | POA: Diagnosis not present

## 2019-09-19 DIAGNOSIS — Z0001 Encounter for general adult medical examination with abnormal findings: Secondary | ICD-10-CM | POA: Diagnosis not present

## 2019-09-19 DIAGNOSIS — R945 Abnormal results of liver function studies: Secondary | ICD-10-CM | POA: Diagnosis not present

## 2019-09-19 DIAGNOSIS — Z23 Encounter for immunization: Secondary | ICD-10-CM | POA: Diagnosis not present

## 2019-09-19 DIAGNOSIS — E039 Hypothyroidism, unspecified: Secondary | ICD-10-CM | POA: Diagnosis not present

## 2019-09-19 DIAGNOSIS — Z6827 Body mass index (BMI) 27.0-27.9, adult: Secondary | ICD-10-CM | POA: Diagnosis not present

## 2019-09-19 DIAGNOSIS — E7849 Other hyperlipidemia: Secondary | ICD-10-CM | POA: Diagnosis not present

## 2019-09-20 DIAGNOSIS — Z1211 Encounter for screening for malignant neoplasm of colon: Secondary | ICD-10-CM | POA: Diagnosis not present

## 2019-10-08 ENCOUNTER — Encounter: Payer: Self-pay | Admitting: Cardiovascular Disease

## 2019-10-08 DIAGNOSIS — R69 Illness, unspecified: Secondary | ICD-10-CM | POA: Diagnosis not present

## 2019-10-08 NOTE — Progress Notes (Signed)
CARDIOLOGY CONSULT NOTE       Patient ID: Marissa [name] MRN: [id number] DOB/AGE: 54-Jan-1967 54 y.o.  Admit date: (Not on file) Referring Physician: Gerarda Fraction Primary Physician: Redmond School, MD Primary Cardiologist: New Reason for Consultation: Chest Pain   Active Problems:   * No active hospital problems. *   HPI:  53 y.o. referred by Dr Gerarda Fraction for chest pain. History of COPD, Lung cancer , HTN, DM-2 and HLD Diabetes Is poorly controlled with A1c 10.8 and has been as high as 12.4 5 months ago She had TEVAR of thoracic aorta By Dr Scot Dock 06/04/19 after presenting with abdominal pain CT showed ulcerated plaque in the descending thoracic  Aorta which embolized to kidney and spleen. Thoracic stent graft done to prevent future embolization   She has smoked as much as 3 ppd for many years and recently cut back to less than a pack She has no history of other vascular disease , CAD TIA or carotid disease   She has had left upper lobectomy in 2006 for primary bronchogenic adenocarcinoma PET scan done May 14 2019 was low risk with low level metabolism in apical RUL nodule   Since stent graft she has had some burning in her chest Not always exertional but sometimes Does not think Its GERD Resolves in minutes She has run out of her eliquis   ROS All other systems reviewed and negative except as noted above  Past Medical History:  Diagnosis Date  . Adenocarcinoma of lung ([place]) 02/06/2014  . Anxiety   . Arthritis   . COPD (chronic obstructive pulmonary disease) ([place])   . Coronary artery disease   . Diabetes mellitus   . GERD (gastroesophageal reflux disease)   . Hypertension   . Hypothyroidism   . Lung cancer ([place])     Family History  Problem Relation Age of Onset  . Colon cancer Mother   . Lung cancer Mother   . Heart disease Mother   . Heart disease Father   . Ovarian cancer Sister   . Hepatitis Brother   . Hernia Brother     Social History   Socioeconomic History    . Marital status: Married    Spouse name: Not on file  . Number of children: Not on file  . Years of education: Not on file  . Highest education level: Not on file  Occupational History  . Not on file  Tobacco Use  . Smoking status: Current Every Day Smoker    Packs/day: 0.25    Years: 32.00    Pack years: 8.00    Types: Cigarettes  . Smokeless tobacco: Never Used  Substance and Sexual Activity  . Alcohol use: No  . Drug use: No  . Sexual activity: Not on file  Other Topics Concern  . Not on file  Social History Narrative  . Not on file   Social Determinants of Health   Financial Resource Strain: Low Risk   . Difficulty of Paying Living Expenses: Not hard at all  Food Insecurity: No Food Insecurity  . Worried About Charity fundraiser in the Last Year: Never true  . Ran Out of Food in the Last Year: Never true  Transportation Needs: No Transportation Needs  . Lack of Transportation (Medical): No  . Lack of Transportation (Non-Medical): No  Physical Activity: Inactive  . Days of Exercise per Week: 0 days  . Minutes of Exercise per Session: 0 min  Stress: No Stress Concern Present  .  Feeling of Stress : Not at all  Social Connections: Somewhat Isolated  . Frequency of Communication with Friends and Family: More than three times a week  . Frequency of Social Gatherings with Friends and Family: Three times a week  . Attends Religious Services: Never  . Active Member of Clubs or Organizations: No  . Attends Archivist Meetings: Never  . Marital Status: Married  Human resources officer Violence: Not At Risk  . Fear of Current or Ex-Partner: No  . Emotionally Abused: No  . Physically Abused: No  . Sexually Abused: No    Past Surgical History:  Procedure Laterality Date  . APPENDECTOMY    . CHOLECYSTECTOMY    . LOBECTOMY Left 2006  . NM MYOCAR IMG MI  05/2008   lexiscan -perfusion defect in anterior myocardium (breast attenuation); remaining myocardium with NO  evidence of ischemia or infarct; EF 78%; low risk scan  . OOPHORECTOMY Left   . PARTIAL HYSTERECTOMY  1985  . SALPINGOOPHORECTOMY Left    benign 9lb mass removed and a massive wall of smaller tumors removed  . THORACIC AORTIC ENDOVASCULAR STENT GRAFT N/A 06/04/2019   Procedure: THORACIC AORTIC ENDOVASCULAR STENT GRAFT;  Surgeon: Angelia Mould, MD;  [facility];  Service: Vascular;  Laterality: N/A;  . TRANSTHORACIC ECHOCARDIOGRAM  05/2008   EF =/>55%, mild MR; trace TR        Physical Exam: There were no vitals taken for this visit.    Affect appropriate Chronically ill female  HEENT: normal Neck supple with no adenopathy JVP normal no bruits no thyromegaly Lungs Post LULobectomy  No wheezing and good diaphragmatic motion Heart:  S1/S2 no murmur, no rub, gallop or click PMI normal Abdomen: benighn, BS positve, no tenderness, no AAA no bruit.  No HSM or HJR Distal pulses intact with no bruits No edema Neuro non-focal Skin warm and dry No muscular weakness   Labs:   Lab Results  Component Value Date   WBC 12.9 (H) 06/07/2019   HGB 11.6 (L) 06/07/2019   HCT 35.4 (L) 06/07/2019   MCV 92.4 06/07/2019   PLT 294 06/07/2019   No results for input(s): NA, K, CL, CO2, BUN, CREATININE, CALCIUM, PROT, BILITOT, ALKPHOS, ALT, AST, GLUCOSE in the last 168 hours.  Invalid input(s): LABALBU No results found for: CKTOTAL, CKMB, CKMBINDEX, TROPONINI  Lab Results  Component Value Date   CHOL 228 (H) 04/17/2019   Lab Results  Component Value Date   HDL 24 (L) 04/17/2019   Lab Results  Component Value Date   LDLCALC 152 (H) 04/17/2019   Lab Results  Component Value Date   TRIG 258 (H) 04/17/2019   Lab Results  Component Value Date   CHOLHDL 9.5 04/17/2019   No results found for: LDLDIRECT    Radiology: No results found.  EKG: SR RAD normal ST segments 06/07/19    ASSESSMENT AND PLAN:   1. Chest Pain :  Atypical multiple CRFls including poorly  controlled DM, heavy smoking and vascular disease f/u Lexiscan myovue 2. Lung Cancer:  Discussed smoking cessation Concern for RUL nodule with PET scan August 2020 being lower risk f/u with oncology  3. Vascular Dx:  F/u CT scan with Dr Doren Custard post stent grafting of descending thoracic aorta for embolus. She has been on aspirin and eliquis since stent graft per [facility] Sent note to Dr Scot Dock and refilled her eliquis ? Duration of RX  4. DM:  Poor control f/u with endocrine discussed low carb  diet  5. Thyroid:  Continue synthroid replacement TSH normal  6. HLD: continue high dose sttin and zetia labs with primary  Signed: Jenkins Rouge 10/08/2019, 5:34 PM

## 2019-10-11 DIAGNOSIS — R69 Illness, unspecified: Secondary | ICD-10-CM | POA: Diagnosis not present

## 2019-10-11 DIAGNOSIS — G894 Chronic pain syndrome: Secondary | ICD-10-CM | POA: Diagnosis not present

## 2019-10-11 DIAGNOSIS — E1165 Type 2 diabetes mellitus with hyperglycemia: Secondary | ICD-10-CM | POA: Diagnosis not present

## 2019-10-11 DIAGNOSIS — I10 Essential (primary) hypertension: Secondary | ICD-10-CM | POA: Diagnosis not present

## 2019-10-15 ENCOUNTER — Encounter: Payer: Self-pay | Admitting: Cardiovascular Disease

## 2019-10-15 ENCOUNTER — Other Ambulatory Visit: Payer: Self-pay

## 2019-10-15 ENCOUNTER — Ambulatory Visit: Payer: Medicare HMO | Admitting: Cardiovascular Disease

## 2019-10-15 DIAGNOSIS — R079 Chest pain, unspecified: Secondary | ICD-10-CM | POA: Diagnosis not present

## 2019-10-15 DIAGNOSIS — I739 Peripheral vascular disease, unspecified: Secondary | ICD-10-CM

## 2019-10-15 MED ORDER — APIXABAN 5 MG PO TABS: ORAL_TABLET | ORAL | 2 refills | Status: DC

## 2019-10-15 NOTE — Patient Instructions (Signed)
Medication Instructions:  Your physician recommends that you continue on your current medications as directed. Please refer to the Current Medication list given to you today.   Labwork: none  Testing/Procedures: Your physician has requested that you have a lexiscan myoview. For further information please visit [email or web address]. Please follow instruction sheet, as given.    Follow-Up: Your physician recommends that you schedule a follow-up appointment in: as needed    Any Other Special Instructions Will Be Listed Below (If Applicable).     If you need a refill on your cardiac medications before your next appointment, please call your pharmacy.

## 2019-10-17 ENCOUNTER — Telehealth: Payer: Self-pay | Admitting: Cardiovascular Disease

## 2019-10-17 DIAGNOSIS — R079 Chest pain, unspecified: Secondary | ICD-10-CM

## 2019-10-17 NOTE — Telephone Encounter (Signed)
Pt called wanting to change her stress test to a treadmill instead of the chemical test because she states she's scared of the shot

## 2019-10-17 NOTE — Telephone Encounter (Signed)
That's fine her ECG is normal but she will need COVID test

## 2019-10-19 NOTE — Telephone Encounter (Signed)
Nuc test cancelled, gxt sched for 1/18, arrive at 8:45 am in Short Stay, no food or drink 6 hours before.Will hold all diabetic meds.Has COVID test on 1/15 at 11 am

## 2019-10-22 ENCOUNTER — Other Ambulatory Visit: Payer: Self-pay

## 2019-10-24 ENCOUNTER — Other Ambulatory Visit: Payer: Self-pay

## 2019-10-24 ENCOUNTER — Ambulatory Visit (INDEPENDENT_AMBULATORY_CARE_PROVIDER_SITE_OTHER): Payer: Medicare HMO | Admitting: Internal Medicine

## 2019-10-24 VITALS — BP 118/78 | HR 113 | Temp 98.3°F | Ht 65.0 in | Wt 159.6 lb

## 2019-10-24 DIAGNOSIS — Z794 Long term (current) use of insulin: Secondary | ICD-10-CM

## 2019-10-24 DIAGNOSIS — E1169 Type 2 diabetes mellitus with other specified complication: Secondary | ICD-10-CM

## 2019-10-24 DIAGNOSIS — E1142 Type 2 diabetes mellitus with diabetic polyneuropathy: Secondary | ICD-10-CM | POA: Diagnosis not present

## 2019-10-24 DIAGNOSIS — E1165 Type 2 diabetes mellitus with hyperglycemia: Secondary | ICD-10-CM | POA: Diagnosis not present

## 2019-10-24 DIAGNOSIS — E1159 Type 2 diabetes mellitus with other circulatory complications: Secondary | ICD-10-CM

## 2019-10-24 LAB — POCT GLYCOSYLATED HEMOGLOBIN (HGB A1C): Hemoglobin A1C: 8.2 % — AB (ref 4.0–5.6)

## 2019-10-24 LAB — BASIC METABOLIC PANEL
BUN: 12 mg/dL (ref 6–23)
CO2: 23 mEq/L (ref 19–32)
Calcium: 9.7 mg/dL (ref 8.4–10.5)
Chloride: 103 mEq/L (ref 96–112)
Creatinine, Ser: 0.67 mg/dL (ref 0.40–1.20)
GFR: 91.49 mL/min (ref >60.00)
Glucose, Bld: 204 mg/dL — ABNORMAL HIGH (ref 70–99)
Potassium: 3.8 mEq/L (ref 3.5–5.1)
Sodium: 136 mEq/L (ref 135–145)

## 2019-10-24 MED ORDER — JARDIANCE 10 MG PO TABS: 10.0000 mg | ORAL_TABLET | Freq: Every day | ORAL | 3 refills | Status: DC

## 2019-10-24 NOTE — Patient Instructions (Signed)
 -   Metformin ONE tablet with Breakfast and ONE tablet with Supper  - Novolog Mix 18 units with Breakfast and 22 units with Supper  - Start Jardiance 10 mg with breakfast   - Check sugar before breakfast and Supper    -HOW TO TREAT LOW BLOOD SUGARS (Blood sugar LESS THAN 70 MG/DL)  Please follow the RULE OF 15 for the treatment of hypoglycemia treatment (when your (blood sugars are less than 70 mg/dL)    STEP 1: Take 15 grams of carbohydrates when your blood sugar is low, which includes:   3-4 GLUCOSE TABS  OR  3-4 OZ OF JUICE OR REGULAR SODA OR  ONE TUBE OF GLUCOSE GEL     STEP 2: RECHECK blood sugar in 15 MINUTES STEP 3: If your blood sugar is still low at the 15 minute recheck --> then, go back to STEP 1 and treat AGAIN with another 15 grams of carbohydrates.

## 2019-10-24 NOTE — Progress Notes (Signed)
Name: Marissa [name]  Age/ Sex: 54 y.o., female   MRN/ DOB: [id number], [birth year]     PCP: Redmond School, MD   Reason for Endocrinology Evaluation: Type 2 Diabetes Mellitus  Initial Endocrine Consultative Visit: 06/04/2019    PATIENT IDENTIFIER: Marissa [name] is a 54 y.o. female with a past medical history of HTN,T2DM, Dyslipidemia and Lung Ca (2006) . The patient has followed with Endocrinology clinic since 06/03/2001 for consultative assistance with management of her diabetes.  DIABETIC HISTORY:  Marissa [name] was diagnosed with T2DM in 2006. She has been on oral glycemic agents in the past ( Glipizide, glimepiride, metformin, Farxiga ( not covered) , insulin added in 01/2019. Her hemoglobin A1c has ranged from 6.7% in 2015, peaking at 13.8% in 02/2019.   On her initial visit to our clinic her A1c was 10.8%  , she was on Glimepiride, Metformin and Levemir. We stopped Levemir, and Glimepiride , started insulin Mix and reduced metformin dose due to nausea and diarrhea.  Was unable to try [place] but was unable to cost   SUBJECTIVE:   During the last visit (07/24/2019): A1c 10.8%. We continued metformin and adjusted insulin mix    Today (10/25/2019): Marissa [name] is here for a 3 month follow up on diabetes management. She checks her blood sugars 2-3  times daily, preprandial to breakfast and supper. The patient has not had hypoglycemic episodes since the last clinic visit. Otherwise, the patient has not required any recent emergency interventions for hypoglycemia but has had recent hospitalizations for aortic aneurysm ,S/P thoracic aortic stent graft  05/2019   Has a pending stress test  Ne fever  No nausea but has diarrhea.   ROS: As per HPI and as detailed below: Review of Systems  Constitutional: Negative for chills and fever.  HENT: Negative for congestion and sore throat.   Cardiovascular: Negative for chest pain and palpitations.  Gastrointestinal: Negative for diarrhea  and nausea.      HOME DIABETES REGIMEN:  Metformin 500 mg ,1 tablet BID  Novolog Mix 18 units with Breakfast and 22 units with Supper      METER DOWNLOAD SUMMARY: Date range evaluated: 12/31-1/13/2021 Fingerstick Blood Glucose Tests = 23 Average Number Tests/Day = 1.6 Overall Mean FS Glucose = 162   BG Ranges: Low = 100 High = 220   Hypoglycemic Events/30 Days: BG < 50 = 0 Episodes of symptomatic severe hypoglycemia = 0    DIABETIC COMPLICATIONS: Microvascular complications:   Neuropathy   Denies: CKD , retinopathy   Last eye exam: Completed 04/2018  Macrovascular complications:   CAD ( Pending PCI next week)   Denies:  PVD, CVA   HISTORY:  Past Medical History:  Past Medical History:  Diagnosis Date  . Adenocarcinoma of lung ([place]) 02/06/2014  . Anxiety   . Arthritis   . COPD (chronic obstructive pulmonary disease) ([place])   . Coronary artery disease   . Diabetes mellitus   . GERD (gastroesophageal reflux disease)   . Hypertension   . Hypothyroidism   . Lung cancer [facility])    Past Surgical History:  Past Surgical History:  Procedure Laterality Date  . APPENDECTOMY    . CHOLECYSTECTOMY    . LOBECTOMY Left 2006  . NM MYOCAR IMG MI  05/2008   lexiscan -perfusion defect in anterior myocardium (breast attenuation); remaining myocardium with NO evidence of ischemia or infarct; EF 78%; low risk scan  . OOPHORECTOMY Left   . PARTIAL HYSTERECTOMY  1985  .  SALPINGOOPHORECTOMY Left    benign 9lb mass removed and a massive wall of smaller tumors removed  . THORACIC AORTIC ENDOVASCULAR STENT GRAFT N/A 06/04/2019   Procedure: THORACIC AORTIC ENDOVASCULAR STENT GRAFT;  Surgeon: Angelia Mould, MD;  Location: [facility] OR;  Service: Vascular;  Laterality: N/A;  . TRANSTHORACIC ECHOCARDIOGRAM  05/2008   EF =/>55%, mild MR; trace TR    Social History:  reports that she has been smoking cigarettes. She has a 8.00 pack-year smoking history. She has never used  smokeless tobacco. She reports that she does not drink alcohol or use drugs. Family History:  Family History  Problem Relation Age of Onset  . Colon cancer Mother   . Lung cancer Mother   . Heart disease Mother   . Heart disease Father   . Ovarian cancer Sister   . Hepatitis Brother   . Hernia Brother      HOME MEDICATIONS: Allergies as of 10/24/2019      Reactions   Bee Venom Anaphylaxis   Penicillins Swelling, Rash, Other (See Comments)   Did it involve swelling of the face/tongue/throat, SOB, or low BP? Yes-whole body (swelling) Did it involve sudden or severe rash/hives, skin peeling, or any reaction on the inside of your mouth or nose? Yes-Hives Did you need to seek medical attention at a hospital or doctor's office? Yes When did it last happen?Childhood If all above answers are "NO", may proceed with cephalosporin use.   Poison Oak Extract Anaphylaxis      Medication List       Accurate as of October 24, 2019 11:59 PM. If you have any questions, ask your nurse or doctor.        albuterol-ipratropium 18-103 MCG/ACT inhaler Commonly known as: COMBIVENT Inhale 2 puffs into the lungs every 6 (six) hours as needed for wheezing or shortness of breath.   ALPRAZolam 0.5 MG tablet Commonly known as: XANAX Take 0.5 mg by mouth See admin instructions. Take 0.5 mg at bedtime every night. Take 0.5mg  once a day as needed for anxiety.   apixaban 5 MG Tabs tablet Commonly known as: Eliquis TAKE 1 TABLET(5 MG) BY MOUTH TWICE DAILY   aspirin EC 81 MG tablet Take 81 mg by mouth daily.   atorvastatin 80 MG tablet Commonly known as: LIPITOR Take 1 tablet (80 mg total) by mouth daily at 6 PM.   busPIRone 30 MG tablet Commonly known as: BUSPAR Take 15 mg by mouth 2 (two) times a day.   cyclobenzaprine 10 MG tablet Commonly known as: FLEXERIL Take 1 tablet (10 mg total) by mouth 3 (three) times daily as needed for muscle spasms.   Doxepin HCl 6 MG Tabs Take 6 mg by  mouth at bedtime.   EpiPen 2-Pak 0.3 mg/0.3 mL Soaj injection Generic drug: EPINEPHrine Inject 0.3 mg into the muscle as needed for anaphylaxis. Has available   esomeprazole 40 MG capsule Commonly known as: NEXIUM Take 1 capsule (40 mg total) by mouth daily before breakfast.   ezetimibe 10 MG tablet Commonly known as: ZETIA Take 10 mg by mouth daily.   fish oil-omega-3 fatty acids 1000 MG capsule Take 2 g by mouth 2 (two) times a day. 4 tablets daily   glucose blood test strip Use as instructed to test blood sugars 7 times daily E11.65   Insulin Pen Needle 32G X 4 MM Misc 2x daily   Jardiance 10 MG Tabs tablet Generic drug: empagliflozin Take 10 mg by mouth daily before breakfast. Started  by: Dorita Sciara, MD   levothyroxine 50 MCG tablet Commonly known as: SYNTHROID Take 1 tablet (50 mcg total) by mouth daily.   lisinopril 10 MG tablet Commonly known as: ZESTRIL Take 1 tablet (10 mg total) by mouth daily.   metFORMIN 500 MG tablet Commonly known as: Glucophage Take 1 tablet (500 mg total) by mouth 2 (two) times daily with a meal.   nicotine 21 mg/24hr patch Commonly known as: NICODERM CQ - dosed in mg/24 hours Place 21 mg onto the skin daily as needed (smoking cessation).   NovoLOG Mix 70/30 FlexPen (70-30) 100 UNIT/ML FlexPen Generic drug: insulin aspart protamine - aspart Inject 0.18 mLs (18 Units total) into the skin daily with breakfast AND 0.22 mLs (22 Units total) daily with supper.   oxyCODONE 15 MG immediate release tablet Commonly known as: ROXICODONE Take 15 mg by mouth every 4 (four) hours as needed for pain.        OBJECTIVE:   Vital Signs: BP 118/78 (BP Location: Left Arm, Patient Position: Sitting, Cuff Size: Normal)   Pulse (!) 113   Temp 98.3 F (36.8 C)   Ht 5\' 5"  (1.651 m)   Wt 159 lb 9.6 oz (72.4 kg)   SpO2 97%   BMI 26.56 kg/m   Wt Readings from Last 3 Encounters:  10/24/19 159 lb 9.6 oz (72.4 kg)  10/15/19 160 lb (72.6  kg)  07/24/19 158 lb 9.6 oz (71.9 kg)     Exam: General: Pt appears well and is in NAD  Lungs: Clear with good BS bilat with no rales, rhonchi, or wheezes  Heart: RRR with normal S1 and S2 and no gallops; no murmurs; no rub  Abdomen: Normoactive bowel sounds, soft, nontender, without masses or organomegaly palpable  Extremities: No pretibial edema.   Neuro: MS is good with appropriate affect, pt is alert and Ox3       DM foot exam: 10/25/2019  The skin of the feet is intact without sores or ulcerations. The pedal pulses are 2+ on right and 2+ on left. The sensation is decreased to a screening 5.07, 10 gram monofilament bilaterally   DATA REVIEWED:  Lab Results  Component Value Date   HGBA1C 8.2 (A) 10/24/2019   HGBA1C 10.8 (A) 06/01/2019   HGBA1C 11.1 (H) 05/29/2019   Lab Results  Component Value Date   LDLCALC 152 (H) 04/17/2019   CREATININE 0.67 10/24/2019    Lab Results  Component Value Date   CHOL 228 (H) 04/17/2019   HDL 24 (L) 04/17/2019   LDLCALC 152 (H) 04/17/2019   TRIG 258 (H) 04/17/2019   CHOLHDL 9.5 04/17/2019         ASSESSMENT / PLAN / RECOMMENDATIONS:   1) Type 2 Diabetes Mellitus, Poorly controlled, With Neuropathic and Macrovascular  complications - Most recent A1c of 8.2 %. Goal A1c < 7.0 %.  Down from 10.8%  - I have praised the patient on her awesome work on controlling her glucose. She has expressed great self discipline through the holidays.  - I have encouraged her to continue with the great work - We discussed add-on therapy with SGLT-2 inhibitors, we discussed risk of genital infections. BMP normal on today's labs   MEDICATIONS:  Continue Metformin 500 mg BID  Novolog Mix 18 units with Breakfast and 22 units with Supper   Start Jardiance 10 mg daily   EDUCATION / INSTRUCTIONS:  BG monitoring instructions: Patient is instructed to check her blood sugars 2 times a  day, breakfast and supper.  Call [facility] [facility]  if: BG persistently < 70 or > 300. . I reviewed the Rule of 15 for the treatment of hypoglycemia in detail with the patient. Literature supplied.     F/U in 3 months    Signed electronically by: Mack Guise, MD  [facility] Endocrinology  [address] [place], [place] [address] Phone: [phone] FAX: [phone]   CC: [address], [place] [address] Phone: [phone]  Fax: [phone]  Return to Endocrinology clinic as below: Future Appointments  Date Time Provider [place]  10/26/2019 11:00 AM AP-SCREENING AP-DOIBP None  10/29/2019  9:30 AM AP TREADMILL AP-CREHP APCREHP  10/29/2019  2:45 PM Tamsen Roers D, RD NDM-NDMR None  11/15/2019 12:10 PM AP-ACAPA LAB AP-ACAPA None  11/15/2019  1:00 PM AP-CT 1 AP-CT [place] [facility]  11/19/2019 11:10 AM Roger Shelter, FNP AP-ACAPA None  01/23/2020  1:00 PM Sache Sane, Melanie Crazier, MD LBPC-LBENDO None

## 2019-10-25 ENCOUNTER — Encounter: Payer: Self-pay | Admitting: Internal Medicine

## 2019-10-26 ENCOUNTER — Other Ambulatory Visit: Payer: Self-pay

## 2019-10-26 ENCOUNTER — Ambulatory Visit (HOSPITAL_COMMUNITY): Payer: Medicare HMO

## 2019-10-26 ENCOUNTER — Other Ambulatory Visit (HOSPITAL_COMMUNITY)
Admission: RE | Admit: 2019-10-26 | Discharge: 2019-10-26 | Disposition: A | Discharge status: Home / Self Care | Payer: Medicare HMO | Source: Ambulatory Visit | Attending: Cardiovascular Disease | Admitting: Cardiovascular Disease

## 2019-10-26 ENCOUNTER — Encounter (HOSPITAL_COMMUNITY): Payer: Medicare HMO

## 2019-10-26 DIAGNOSIS — Z01812 Encounter for preprocedural laboratory examination: Secondary | ICD-10-CM | POA: Diagnosis not present

## 2019-10-26 DIAGNOSIS — Z20822 Contact with and (suspected) exposure to covid-19: Secondary | ICD-10-CM | POA: Diagnosis not present

## 2019-10-26 LAB — SARS CORONAVIRUS 2 (TAT 6-24 HRS): SARS Coronavirus 2: NEGATIVE

## 2019-10-29 ENCOUNTER — Encounter: Payer: Self-pay | Admitting: Nutrition

## 2019-10-29 ENCOUNTER — Other Ambulatory Visit: Payer: Self-pay

## 2019-10-29 ENCOUNTER — Encounter: Payer: Medicare HMO | Attending: Internal Medicine | Admitting: Nutrition

## 2019-10-29 ENCOUNTER — Ambulatory Visit (HOSPITAL_COMMUNITY)
Admission: RE | Admit: 2019-10-29 | Discharge: 2019-10-29 | Disposition: A | Discharge status: Home / Self Care | Payer: Medicare HMO | Source: Ambulatory Visit | Attending: Cardiovascular Disease | Admitting: Cardiovascular Disease

## 2019-10-29 DIAGNOSIS — E1169 Type 2 diabetes mellitus with other specified complication: Secondary | ICD-10-CM | POA: Insufficient documentation

## 2019-10-29 DIAGNOSIS — R079 Chest pain, unspecified: Secondary | ICD-10-CM

## 2019-10-29 LAB — EXERCISE TOLERANCE TEST
Estimated workload: 7 METS
Exercise duration (min): 4 min
Exercise duration (sec): 56 s
MPHR: 166 {beats}/min
Peak HR: 144 {beats}/min
Percent HR: 86 %
RPE: 15
Rest HR: 85 {beats}/min

## 2019-10-29 NOTE — Progress Notes (Signed)
Medical Nutrition Therapy:  Appt start time: 1400end time:  1430.  Assessment:  Primary concerns today: Diabetes Type 2.  A1C down to 8.2%. Changed : walking more. Eating more fresh fruits and vegetables.  Feeling a lot better. Eaitng three meals per day.Watching her carbs. Saw Dr. Kelton Pillar  Insulin increase of 70/30 per Dr. Kelton Pillar to 18 units of 70/30 in am  And 22 units with dinner.  Metformin 500 mg BID. Jardiance 10  Mg a day also recently added.  Fbs 140-150s   before supper  Still smoking and smokes 1/2 ppd. Walks  3-4 miles per day. No weight change. Working on cutting out diet sodas.   CMP Latest Ref Rng & Units 10/24/2019 06/07/2019 06/07/2019  Glucose 70 - 99 mg/dL 204(H) - 205(H)  BUN 6 - 23 mg/dL 12 - 11  Creatinine 0.40 - 1.20 mg/dL 0.67 0.50 0.60  Sodium 135 - 145 mEq/L 136 - 136  Potassium 3.5 - 5.1 mEq/L 3.8 - 3.5  Chloride 96 - 112 mEq/L 103 - 102  CO2 19 - 32 mEq/L 23 - 22  Calcium 8.4 - 10.5 mg/dL 9.7 - 9.3  Total Protein 6.5 - 8.1 g/dL - - 6.7  Total Bilirubin 0.3 - 1.2 mg/dL - - 0.5  Alkaline Phos 38 - 126 U/L - - 82  AST 15 - 41 U/L - - 12(L)  ALT 0 - 44 U/L - - 23    . Lab Results  Component Value Date   HGBA1C 8.2 (A) 10/24/2019   Lipid Panel     Component Value Date/Time   CHOL 228 (H) 04/17/2019 2034   TRIG 258 (H) 04/17/2019 2034   HDL 24 (L) 04/17/2019 2034   CHOLHDL 9.5 04/17/2019 2034   VLDL 52 (H) 04/17/2019 2034   LDLCALC 152 (H) 04/17/2019 2034    Preferred Learning Style:    No preference indicated   Learning Readiness:   Ready  Change in progress   MEDICATIONS:   DIETARY INTAKE:  24-hr recall:  B  ( AM): omelet,  Snk ( AM):  L ( PM):  Beef vegetables barley soup, water Snk ( PM):  Nuts, or granola bars D ( PM)  99 mg/dl  Cauliforwer , broccoli and carrrts , asparagus, chicken, water Snk ( PM): Beverages water, DIet MT Dew - but not as much.  Usual physical activity: walk in yard and walks dog.   Estimated  energy needs: 1200-1500 calories g carbohydrates 135 g protein 90 g fat  Progress Towards Goal(s):  In progress.   Nutritional Diagnosis:  NB-1.1 Food and nutrition-related knowledge deficit As related to Diabetes Type 2.  As evidenced by A1C 10.8%.    Intervention:  Nutrition and Diabetes education provided on My Plate, CHO counting, meal planning, portion sizes, timing of meals, avoiding snacks between meals unless having a low blood sugar, target ranges for A1C and blood sugars, signs/symptoms and treatment of hyper/hypoglycemia, monitoring blood sugars, taking medications as prescribed, benefits of exercising 30 minutes per day and prevention of complications of DM. Follow up 3 months  Goals  Make sure eating 30 grams of carbs of meals  Keep up the walking Eat meals on time Get BS before breakfast and before dinner closer to 130 mg/dl. Keep drinking water Cut out diet soda Goals is A1C less than 7%.  .  Teaching Method Utilized:  Visual Auditory Hands on  Handouts given during visit include:  The Plate Method   Meal Plan Card  Diabetes  Instructions.   Barriers to learning/adherence to lifestyle change: none  Demonstrated degree of understanding via:  Teach Back   Monitoring/Evaluation:  Dietary intake, exercise, , and body weight in 3 months.

## 2019-10-29 NOTE — Patient Instructions (Addendum)
Goals  Make sure eating 30 grams of carbs of meals  Keep up the walking Eat meals on time Get BS before breakfast and before dinner closer to 130 mg/dl. Keep drinking water Cut out diet soda Goals is A1C less than 7%.

## 2019-11-07 DIAGNOSIS — R69 Illness, unspecified: Secondary | ICD-10-CM | POA: Diagnosis not present

## 2019-11-14 ENCOUNTER — Other Ambulatory Visit (HOSPITAL_COMMUNITY): Payer: Self-pay

## 2019-11-14 DIAGNOSIS — C3492 Malignant neoplasm of unspecified part of left bronchus or lung: Secondary | ICD-10-CM

## 2019-11-15 ENCOUNTER — Ambulatory Visit (HOSPITAL_COMMUNITY): Admission: RE | Admit: 2019-11-15 | Payer: Medicare HMO | Source: Ambulatory Visit

## 2019-11-15 ENCOUNTER — Inpatient Hospital Stay (HOSPITAL_COMMUNITY): Payer: Medicare HMO

## 2019-11-15 ENCOUNTER — Other Ambulatory Visit: Payer: Self-pay

## 2019-11-15 ENCOUNTER — Telehealth: Payer: Self-pay

## 2019-11-15 NOTE — Telephone Encounter (Signed)
Patient called stating that she had an appointment for CT scan and blood work to at 1155 and 1 and she has not eaten anything or took her meds.  Her sugar is 208 and she just took her Jardiance and her metformin and wants to know if she needs to take her insulin now or wait until supper time and how much does she need to take?   Please call patient back

## 2019-11-16 ENCOUNTER — Encounter (HOSPITAL_COMMUNITY): Payer: Self-pay

## 2019-11-16 ENCOUNTER — Inpatient Hospital Stay (HOSPITAL_COMMUNITY): Payer: Medicare HMO | Attending: Hematology

## 2019-11-16 DIAGNOSIS — Z801 Family history of malignant neoplasm of trachea, bronchus and lung: Secondary | ICD-10-CM | POA: Insufficient documentation

## 2019-11-16 DIAGNOSIS — E039 Hypothyroidism, unspecified: Secondary | ICD-10-CM | POA: Insufficient documentation

## 2019-11-16 DIAGNOSIS — E119 Type 2 diabetes mellitus without complications: Secondary | ICD-10-CM | POA: Insufficient documentation

## 2019-11-16 DIAGNOSIS — R918 Other nonspecific abnormal finding of lung field: Secondary | ICD-10-CM | POA: Insufficient documentation

## 2019-11-16 DIAGNOSIS — I700 Atherosclerosis of aorta: Secondary | ICD-10-CM | POA: Insufficient documentation

## 2019-11-16 DIAGNOSIS — Z85118 Personal history of other malignant neoplasm of bronchus and lung: Secondary | ICD-10-CM | POA: Insufficient documentation

## 2019-11-16 DIAGNOSIS — D735 Infarction of spleen: Secondary | ICD-10-CM | POA: Insufficient documentation

## 2019-11-16 DIAGNOSIS — F17210 Nicotine dependence, cigarettes, uncomplicated: Secondary | ICD-10-CM | POA: Insufficient documentation

## 2019-11-16 DIAGNOSIS — Z794 Long term (current) use of insulin: Secondary | ICD-10-CM | POA: Insufficient documentation

## 2019-11-16 DIAGNOSIS — N280 Ischemia and infarction of kidney: Secondary | ICD-10-CM | POA: Insufficient documentation

## 2019-11-16 DIAGNOSIS — Z8041 Family history of malignant neoplasm of ovary: Secondary | ICD-10-CM | POA: Insufficient documentation

## 2019-11-16 DIAGNOSIS — Z902 Acquired absence of lung [part of]: Secondary | ICD-10-CM | POA: Insufficient documentation

## 2019-11-16 DIAGNOSIS — Z800 Family history of malignant neoplasm of digestive organs: Secondary | ICD-10-CM | POA: Insufficient documentation

## 2019-11-16 DIAGNOSIS — Z7901 Long term (current) use of anticoagulants: Secondary | ICD-10-CM | POA: Insufficient documentation

## 2019-11-16 NOTE — Telephone Encounter (Signed)
Spoke with pt and cleared up any confusion

## 2019-11-19 ENCOUNTER — Ambulatory Visit (HOSPITAL_COMMUNITY): Payer: Medicare HMO | Admitting: Hematology

## 2019-11-19 DIAGNOSIS — S80211A Abrasion, right knee, initial encounter: Secondary | ICD-10-CM | POA: Diagnosis not present

## 2019-11-19 DIAGNOSIS — E1140 Type 2 diabetes mellitus with diabetic neuropathy, unspecified: Secondary | ICD-10-CM | POA: Diagnosis not present

## 2019-11-19 DIAGNOSIS — Z1389 Encounter for screening for other disorder: Secondary | ICD-10-CM | POA: Diagnosis not present

## 2019-11-19 DIAGNOSIS — Z23 Encounter for immunization: Secondary | ICD-10-CM | POA: Diagnosis not present

## 2019-11-19 DIAGNOSIS — G894 Chronic pain syndrome: Secondary | ICD-10-CM | POA: Diagnosis not present

## 2019-11-19 DIAGNOSIS — Z6826 Body mass index (BMI) 26.0-26.9, adult: Secondary | ICD-10-CM | POA: Diagnosis not present

## 2019-11-20 ENCOUNTER — Ambulatory Visit (HOSPITAL_COMMUNITY): Payer: Medicare HMO | Admitting: Nurse Practitioner

## 2019-11-21 ENCOUNTER — Other Ambulatory Visit (HOSPITAL_COMMUNITY): Payer: Self-pay

## 2019-11-21 DIAGNOSIS — R69 Illness, unspecified: Secondary | ICD-10-CM | POA: Diagnosis not present

## 2019-11-21 DIAGNOSIS — C3492 Malignant neoplasm of unspecified part of left bronchus or lung: Secondary | ICD-10-CM

## 2019-11-22 ENCOUNTER — Ambulatory Visit (HOSPITAL_COMMUNITY)
Admission: RE | Admit: 2019-11-22 | Discharge: 2019-11-22 | Disposition: A | Discharge status: Home / Self Care | Payer: Medicare HMO | Source: Ambulatory Visit | Attending: Hematology | Admitting: Hematology

## 2019-11-22 ENCOUNTER — Inpatient Hospital Stay (HOSPITAL_COMMUNITY): Payer: Medicare HMO | Attending: Hematology

## 2019-11-22 ENCOUNTER — Other Ambulatory Visit: Payer: Self-pay

## 2019-11-22 DIAGNOSIS — C3492 Malignant neoplasm of unspecified part of left bronchus or lung: Secondary | ICD-10-CM | POA: Insufficient documentation

## 2019-11-22 MED ORDER — IOHEXOL 300 MG/ML  SOLN
75.0000 mL | Freq: Once | INTRAMUSCULAR | Status: AC | PRN
  Administered 2019-11-22: 15:00:00 75 mL via INTRAVENOUS

## 2019-11-23 ENCOUNTER — Inpatient Hospital Stay (HOSPITAL_COMMUNITY): Payer: Medicare HMO | Admitting: Nurse Practitioner

## 2019-11-23 DIAGNOSIS — Z794 Long term (current) use of insulin: Secondary | ICD-10-CM | POA: Diagnosis not present

## 2019-11-23 DIAGNOSIS — C3492 Malignant neoplasm of unspecified part of left bronchus or lung: Secondary | ICD-10-CM

## 2019-11-23 DIAGNOSIS — R918 Other nonspecific abnormal finding of lung field: Secondary | ICD-10-CM | POA: Diagnosis not present

## 2019-11-23 DIAGNOSIS — E119 Type 2 diabetes mellitus without complications: Secondary | ICD-10-CM | POA: Diagnosis not present

## 2019-11-23 DIAGNOSIS — D735 Infarction of spleen: Secondary | ICD-10-CM | POA: Diagnosis not present

## 2019-11-23 DIAGNOSIS — I700 Atherosclerosis of aorta: Secondary | ICD-10-CM | POA: Diagnosis not present

## 2019-11-23 DIAGNOSIS — Z85118 Personal history of other malignant neoplasm of bronchus and lung: Secondary | ICD-10-CM | POA: Diagnosis not present

## 2019-11-23 DIAGNOSIS — E039 Hypothyroidism, unspecified: Secondary | ICD-10-CM | POA: Diagnosis not present

## 2019-11-23 DIAGNOSIS — R69 Illness, unspecified: Secondary | ICD-10-CM | POA: Diagnosis not present

## 2019-11-23 DIAGNOSIS — Z902 Acquired absence of lung [part of]: Secondary | ICD-10-CM | POA: Diagnosis not present

## 2019-11-23 DIAGNOSIS — Z7901 Long term (current) use of anticoagulants: Secondary | ICD-10-CM | POA: Diagnosis not present

## 2019-11-23 DIAGNOSIS — N280 Ischemia and infarction of kidney: Secondary | ICD-10-CM | POA: Diagnosis not present

## 2019-11-23 DIAGNOSIS — Z8041 Family history of malignant neoplasm of ovary: Secondary | ICD-10-CM | POA: Diagnosis not present

## 2019-11-23 DIAGNOSIS — Z800 Family history of malignant neoplasm of digestive organs: Secondary | ICD-10-CM | POA: Diagnosis not present

## 2019-11-23 DIAGNOSIS — F17210 Nicotine dependence, cigarettes, uncomplicated: Secondary | ICD-10-CM | POA: Diagnosis not present

## 2019-11-23 DIAGNOSIS — Z801 Family history of malignant neoplasm of trachea, bronchus and lung: Secondary | ICD-10-CM | POA: Diagnosis not present

## 2019-11-23 NOTE — Progress Notes (Signed)
Marissa [name], [place] [id number]   CLINIC:  Medical Oncology/Hematology  PCP:  [facility], [place] [address] [phone]   REASON FOR VISIT: Follow-up for adenocarcinoma of the left lung  CURRENT THERAPY: Observation  BRIEF ONCOLOGIC HISTORY:  Oncology History  Adenocarcinoma of lung ([place])  03/23/2005 Initial Diagnosis   Adenocarcinoma of lung with left upper lobe wedge resection by Dr. Arlyce Dice measuring 1.4 cm.  0/4 lymph nodes.   01/27/2015 Imaging   CT chest- 1. Stable examination demonstrating multiple tiny 2-3 mm pulmonary nodules in the right upper lobe which are likely benign areas of mucoid impaction in terminal bronchioles, in addition to an unchanged 6 x 5 mm ground-glass attenuation nodule in the apex of the right upper lobe. Continued attention on annual followup examinations is recommended to ensure continued stability. 2. Status post left upper lobectomy. No findings to suggest local recurrence of disease. 3. Atherosclerosis, including three-vessel coronary artery disease. Please note that although the presence of coronary artery calcium documents the presence of coronary artery disease, the severity of this disease and any potential stenosis cannot be assessed on this non-gated CT examination. Assessment for potential risk factor modification, dietary therapy or pharmacologic therapy may be warranted, if clinically indicated.   04/19/2017 Imaging   CT chest- 1. No evidence lung cancer recurrence in the LEFT lower lung. 2. Increase in the ground-glass surrounding a RIGHT upper lobe pulmonary nodule. Minimal size increased over 3 year interval. Recommend follow-up CT in 12 months for part solid nodule. This recommendation follows the consensus statement: Guidelines for Management of Incidental Pulmonary Nodules Detected on CT Images: From the Fleischner Society 2017; Radiology 2017;  284:228-243. These results will be called to the ordering clinician or representative by the Radiologist Assistant, and communication documented in the PACS or zVision Dashboard.      CANCER STAGING: Cancer Staging Adenocarcinoma of lung ([place]) Staging form: Lung, AJCC 7th Edition - Clinical: Stage IA (T1a, N0, M0) - Signed by Baird Cancer, PA-C on 02/06/2014    INTERVAL HISTORY:  Ms. [name] 54 y.o. female returns for routine follow-up for adenocarcinoma of the left lung. Patient reports she is doing well since her last visit. She denies any new cough or sputum production. She denies any increased shortness of breath. She does report she is still smoking but cutting back. Denies any nausea, vomiting, or diarrhea. Denies any new pains. Had not noticed any recent bleeding such as epistaxis, hematuria or hematochezia. Denies recent chest pain on exertion, shortness of breath on minimal exertion, pre-syncopal episodes, or palpitations. Denies any numbness or tingling in hands or feet. Denies any recent fevers, infections, or recent hospitalizations. Patient reports appetite at 100% and energy level at 75%. She is eating well maintain her weight at this time.     REVIEW OF SYSTEMS:  Review of Systems  Respiratory: Positive for shortness of breath.   All other systems reviewed and are negative.    PAST MEDICAL/SURGICAL HISTORY:  Past Medical History:  Diagnosis Date  . Adenocarcinoma of lung ([place]) 02/06/2014  . Anxiety   . Arthritis   . COPD (chronic obstructive pulmonary disease) ([place])   . Coronary artery disease   . Diabetes mellitus   . GERD (gastroesophageal reflux disease)   . Hypertension   . Hypothyroidism   . Lung cancer [facility])    Past Surgical History:  Procedure Laterality Date  . APPENDECTOMY    . CHOLECYSTECTOMY    .  LOBECTOMY Left 2006  . NM MYOCAR IMG MI  05/2008   lexiscan -perfusion defect in anterior myocardium (breast attenuation); remaining myocardium  with NO evidence of ischemia or infarct; EF 78%; low risk scan  . OOPHORECTOMY Left   . PARTIAL HYSTERECTOMY  1985  . SALPINGOOPHORECTOMY Left    benign 9lb mass removed and a massive wall of smaller tumors removed  . THORACIC AORTIC ENDOVASCULAR STENT GRAFT N/A 06/04/2019   Procedure: THORACIC AORTIC ENDOVASCULAR STENT GRAFT;  Surgeon: Angelia Mould, MD;  Location[facility] OR;  Service: Vascular;  Laterality: N/A;  . TRANSTHORACIC ECHOCARDIOGRAM  05/2008   EF =/>55%, mild MR; trace TR     SOCIAL HISTORY:  Social History   Socioeconomic History  . Marital status: Married    Spouse name: Not on file  . Number of children: Not on file  . Years of education: Not on file  . Highest education level: Not on file  Occupational History  . Not on file  Tobacco Use  . Smoking status: Current Every Day Smoker    Packs/day: 0.25    Years: 32.00    Pack years: 8.00    Types: Cigarettes  . Smokeless tobacco: Never Used  Substance and Sexual Activity  . Alcohol use: No  . Drug use: No  . Sexual activity: Not on file  Other Topics Concern  . Not on file  Social History Narrative  . Not on file   Social Determinants of Health   Financial Resource Strain: Low Risk   . Difficulty of Paying Living Expenses: Not hard at all  Food Insecurity: No Food Insecurity  . Worried About Charity fundraiser in the Last Year: Never true  . Ran Out of Food in the Last Year: Never true  Transportation Needs: No Transportation Needs  . Lack of Transportation (Medical): No  . Lack of Transportation (Non-Medical): No  Physical Activity: Inactive  . Days of Exercise per Week: 0 days  . Minutes of Exercise per Session: 0 min  Stress: No Stress Concern Present  . Feeling of Stress : Not at all  Social Connections: Somewhat Isolated  . Frequency of Communication with Friends and Family: More than three times a week  . Frequency of Social Gatherings with Friends and Family: Three times a week  .  Attends Religious Services: Never  . Active Member of Clubs or Organizations: No  . Attends Archivist Meetings: Never  . Marital Status: Married  Human resources officer Violence: Not At Risk  . Fear of Current or Ex-Partner: No  . Emotionally Abused: No  . Physically Abused: No  . Sexually Abused: No    FAMILY HISTORY:  Family History  Problem Relation Age of Onset  . Colon cancer Mother   . Lung cancer Mother   . Heart disease Mother   . Heart disease Father   . Ovarian cancer Sister   . Hepatitis Brother   . Hernia Brother     CURRENT MEDICATIONS:  Outpatient Encounter Medications as of 11/23/2019  Medication Sig  . apixaban (ELIQUIS) 5 MG TABS tablet TAKE 1 TABLET(5 MG) BY MOUTH TWICE DAILY  . aspirin EC 81 MG tablet Take 81 mg by mouth daily.  Marland Kitchen atorvastatin (LIPITOR) 80 MG tablet Take 1 tablet (80 mg total) by mouth daily at 6 PM.  . busPIRone (BUSPAR) 30 MG tablet Take 15 mg by mouth 2 (two) times a day.   . Doxepin HCl 6 MG TABS Take 6  mg by mouth at bedtime.  . empagliflozin (JARDIANCE) 10 MG TABS tablet Take 10 mg by mouth daily before breakfast.  . esomeprazole (NEXIUM) 40 MG capsule Take 1 capsule (40 mg total) by mouth daily before breakfast.  . ezetimibe (ZETIA) 10 MG tablet Take 10 mg by mouth daily.    . fish oil-omega-3 fatty acids 1000 MG capsule Take 2 g by mouth 2 (two) times a day. 4 tablets daily   . glucose blood test strip Use as instructed to test blood sugars 7 times daily E11.65  . insulin aspart protamine - aspart (NOVOLOG MIX 70/30 FLEXPEN) (70-30) 100 UNIT/ML FlexPen Inject 0.18 mLs (18 Units total) into the skin daily with breakfast AND 0.22 mLs (22 Units total) daily with supper.  . Insulin Pen Needle 32G X 4 MM MISC 2x daily  . levothyroxine (SYNTHROID) 50 MCG tablet Take 1 tablet (50 mcg total) by mouth daily.  Marland Kitchen lisinopril (ZESTRIL) 10 MG tablet Take 1 tablet (10 mg total) by mouth daily.  . metFORMIN (GLUCOPHAGE) 500 MG tablet Take 1  tablet (500 mg total) by mouth 2 (two) times daily with a meal.  . albuterol-ipratropium (COMBIVENT) 18-103 MCG/ACT inhaler Inhale 2 puffs into the lungs every 6 (six) hours as needed for wheezing or shortness of breath. (Patient not taking: Reported on 11/23/2019)  . ALPRAZolam (XANAX) 0.5 MG tablet Take 0.5 mg by mouth as needed. Take 0.5 mg at bedtime every night. Take 0.5mg  once a day as needed for anxiety.  Marland Kitchen EPIPEN 2-PAK 0.3 MG/0.3ML SOAJ injection Inject 0.3 mg into the muscle as needed for anaphylaxis. Has available  . oxyCODONE (ROXICODONE) 15 MG immediate release tablet Take 15 mg by mouth every 4 (four) hours as needed for pain.   . [DISCONTINUED] cyclobenzaprine (FLEXERIL) 10 MG tablet Take 1 tablet (10 mg total) by mouth 3 (three) times daily as needed for muscle spasms.  . [DISCONTINUED] glimepiride (AMARYL) 4 MG tablet   . [DISCONTINUED] LEVEMIR FLEXTOUCH 100 UNIT/ML Pen ADM 20 UNI [address]  . [DISCONTINUED] nicotine (NICODERM CQ - DOSED IN MG/24 HOURS) 21 mg/24hr patch Place 21 mg onto the skin daily as needed (smoking cessation).   No facility-administered encounter medications on file as of 11/23/2019.    ALLERGIES:  Allergies  Allergen Reactions  . Bee Venom Anaphylaxis  . Penicillins Swelling, Rash and Other (See Comments)    Did it involve swelling of the face/tongue/throat, SOB, or low BP? Yes-whole body (swelling) Did it involve sudden or severe rash/hives, skin peeling, or any reaction on the inside of your mouth or nose? Yes-Hives Did you need to seek medical attention at a hospital or doctor's office? Yes When did it last happen?Childhood If all above answers are "NO", may proceed with cephalosporin use.  . Poison Oak Extract Anaphylaxis     PHYSICAL EXAM:  ECOG Performance status: 1  Vitals:   11/23/19 0929  BP: 95/68  Pulse: 91  Resp: 16  Temp: (!) 97.1 F (36.2 C)  SpO2: 96%   Filed Weights   11/23/19 0929  Weight: 162 lb (73.5 kg)    Physical  Exam Constitutional:      Appearance: Normal appearance. She is normal weight.  Cardiovascular:     Rate and Rhythm: Normal rate and regular rhythm.     Heart sounds: Normal heart sounds.  Pulmonary:     Effort: Pulmonary effort is normal.     Breath sounds: Normal breath sounds.  Abdominal:     General:  Bowel sounds are normal.     Palpations: Abdomen is soft.  Musculoskeletal:        General: Normal range of motion.  Skin:    General: Skin is warm.  Neurological:     Mental Status: She is alert. Mental status is at baseline.  Psychiatric:        Mood and Affect: Mood normal.        Behavior: Behavior normal.        Thought Content: Thought content normal.        Judgment: Judgment normal.      LABORATORY DATA:  I have reviewed the labs as listed.  CBC    Component Value Date/Time   WBC 12.9 (H) 06/07/2019 1155   RBC 3.83 (L) 06/07/2019 1155   HGB 11.6 (L) 06/07/2019 1155   HCT 35.4 (L) 06/07/2019 1155   PLT 294 06/07/2019 1155   MCV 92.4 06/07/2019 1155   MCH 30.3 06/07/2019 1155   MCHC 32.8 06/07/2019 1155   RDW 12.7 06/07/2019 1155   LYMPHSABS 2.0 06/07/2019 1155   MONOABS 1.4 (H) 06/07/2019 1155   EOSABS 0.2 06/07/2019 1155   BASOSABS 0.1 06/07/2019 1155   CMP Latest Ref Rng & Units 10/24/2019 06/07/2019 06/07/2019  Glucose 70 - 99 mg/dL 204(H) - 205(H)  BUN 6 - 23 mg/dL 12 - 11  Creatinine 0.40 - 1.20 mg/dL 0.67 0.50 0.60  Sodium 135 - 145 mEq/L 136 - 136  Potassium 3.5 - 5.1 mEq/L 3.8 - 3.5  Chloride 96 - 112 mEq/L 103 - 102  CO2 19 - 32 mEq/L 23 - 22  Calcium 8.4 - 10.5 mg/dL 9.7 - 9.3  Total Protein 6.5 - 8.1 g/dL - - 6.7  Total Bilirubin 0.3 - 1.2 mg/dL - - 0.5  Alkaline Phos 38 - 126 U/L - - 82  AST 15 - 41 U/L - - 12(L)  ALT 0 - 44 U/L - - 23    DIAGNOSTIC IMAGING:  I have independently reviewed the CT scans and discussed with the patient.   I personally performed a face-to-face visit.  All questions were answered to patient's stated  satisfaction. Encouraged patient to call with any new concerns or questions before his next visit to the cancer center and we can certain see him sooner, if needed.    ASSESSMENT & PLAN:   Adenocarcinoma of lung ([place]) 1.  Stage Ia left upper lobe adenocarcinoma, bronchioloalveolar type: -Status post left upper lobectomy on 03/24/2015. -Continues to smoke 1 and 1/2 pack/day of cigarettes.  Reportedly tried Chantix which caused behavioral changes.  Also tried Wellbutrin which made her nauseous.  Reports that she cannot chew Nicorette gum because of her dentures.  We have recommended trying nicotine lozenges. -CT scan of the chest without contrast on 04/20/2018 did not show any evidence of recurrence.  There is a slight interval increase in size of groundglass opacity in the right upper lobe subsolid nodule. -CT chest performed on 04/17/2019 revealed arterosclerosis of the aorta with splenic infarct and renal infarct.  There was redemonstration of right upper lobe groundglass opacity, 1.4 cm in size concerning for adenocarcinoma.  She is currently following up with cardiology for the arterosclerosis of the aorta and is currently on Eliquis.  Concerning groundglass opacity in the right apex have recommended patient proceed with PET CT scan to determine if nodule is FDG avid. -PET/CT confirmed right apical upper lobe 1.9 x 1.1 cm groundglass nodule.  The nodule only demonstrates a low level  metabolism with a max SUV of 1.0.  There were no hypermetabolic pulmonary findings.  However indolent primary bronchiogenic adenocarcinoma cannot be excluded.  Recommended patient to continue surveillance CT scans every 6 months. -CT scan of the chest on 11/22/2019 showed mixed attenuation lesion in the right lung apex is similar in size today but shows a more predominant soft tissue nodular component measuring up to 6 mm. -We will repeat a CT of the chest in 6 months.  2.  Tobacco abuse: -Patient was counseled on the  harmful effects of smoking and was advised to stop smoking. -Patient was started back on Chantix.  Patient has stopped smoking 3-4 6 packs of cigarettes a day now down to 10 cigarettes a day. -She plans to complete Chantix until she stops smoking.      Orders placed this encounter:  Orders Placed This Encounter  Procedures  . CT CHEST W CONTRAST  . Lactate dehydrogenase  . CBC with Differential/Platelet  . Comprehensive metabolic panel  . Vitamin B12  . VITAMIN D 25 Hydroxy (Vit-D Deficiency, Fractures)     Francene Finders, FNP-C [place] [phone]

## 2019-11-23 NOTE — Assessment & Plan Note (Addendum)
1.  Stage Ia left upper lobe adenocarcinoma, bronchioloalveolar type: -Status post left upper lobectomy on 03/24/2015. -Continues to smoke 1 and 1/2 pack/day of cigarettes.  Reportedly tried Chantix which caused behavioral changes.  Also tried Wellbutrin which made her nauseous.  Reports that she cannot chew Nicorette gum because of her dentures.  We have recommended trying nicotine lozenges. -CT scan of the chest without contrast on 04/20/2018 did not show any evidence of recurrence.  There is a slight interval increase in size of groundglass opacity in the right upper lobe subsolid nodule. -CT chest performed on 04/17/2019 revealed arterosclerosis of the aorta with splenic infarct and renal infarct.  There was redemonstration of right upper lobe groundglass opacity, 1.4 cm in size concerning for adenocarcinoma.  She is currently following up with cardiology for the arterosclerosis of the aorta and is currently on Eliquis.  Concerning groundglass opacity in the right apex have recommended patient proceed with PET CT scan to determine if nodule is FDG avid. -PET/CT confirmed right apical upper lobe 1.9 x 1.1 cm groundglass nodule.  The nodule only demonstrates a low level metabolism with a max SUV of 1.0.  There were no hypermetabolic pulmonary findings.  However indolent primary bronchiogenic adenocarcinoma cannot be excluded.  Recommended patient to continue surveillance CT scans every 6 months. -CT scan of the chest on 11/22/2019 showed mixed attenuation lesion in the right lung apex is similar in size today but shows a more predominant soft tissue nodular component measuring up to 6 mm. -We will repeat a CT of the chest in 6 months.  2.  Tobacco abuse: -Patient was counseled on the harmful effects of smoking and was advised to stop smoking. -Patient was started back on Chantix.  Patient has stopped smoking 3-4 6 packs of cigarettes a day now down to 10 cigarettes a day. -She plans to complete Chantix  until she stops smoking.

## 2019-11-23 NOTE — Patient Instructions (Signed)
[facility] Discharge Instructions  Follow up in 6 months with labs and CT scan   Thank you for choosing [facility] at [facility] to provide your oncology and hematology care.  To afford each patient quality time with our provider, please arrive at least 15 minutes before your scheduled appointment time.   If you have a lab appointment with the Cancer Center please come in thru the Main Entrance and check in at the main information desk.  You need to re-schedule your appointment should you arrive 10 or more minutes late.  We strive to give you quality time with our providers, and arriving late affects you and other patients whose appointments are after yours.  Also, if you no show three or more times for appointments you may be dismissed from the clinic at the providers discretion.     Again, thank you for choosing [facility].  Our hope is that these requests will decrease the amount of time that you wait before being seen by our physicians.       _____________________________________________________________  Should you have questions after your visit to [facility], please contact our office at [phone] between the hours of 8:00 a.m. and 4:30 p.m.  Voicemails left after 4:00 p.m. will not be returned until the following business day.  For prescription refill requests, have your pharmacy contact our office and allow 72 hours.    Due to Covid, you will need to wear a mask upon entering the hospital. If you do not have a mask, a mask will be given to you at the Main Entrance upon arrival. For doctor visits, patients may have 1 support person with them. For treatment visits, patients can not have anyone with them due to social distancing guidelines and our immunocompromised population.      

## 2019-12-09 DIAGNOSIS — E1165 Type 2 diabetes mellitus with hyperglycemia: Secondary | ICD-10-CM | POA: Diagnosis not present

## 2019-12-09 DIAGNOSIS — R69 Illness, unspecified: Secondary | ICD-10-CM | POA: Diagnosis not present

## 2019-12-09 DIAGNOSIS — G894 Chronic pain syndrome: Secondary | ICD-10-CM | POA: Diagnosis not present

## 2019-12-09 DIAGNOSIS — I10 Essential (primary) hypertension: Secondary | ICD-10-CM | POA: Diagnosis not present

## 2019-12-14 DIAGNOSIS — R69 Illness, unspecified: Secondary | ICD-10-CM | POA: Diagnosis not present

## 2019-12-14 DIAGNOSIS — G894 Chronic pain syndrome: Secondary | ICD-10-CM | POA: Diagnosis not present

## 2019-12-19 DIAGNOSIS — R69 Illness, unspecified: Secondary | ICD-10-CM | POA: Diagnosis not present

## 2020-01-09 DIAGNOSIS — I10 Essential (primary) hypertension: Secondary | ICD-10-CM | POA: Diagnosis not present

## 2020-01-09 DIAGNOSIS — E1165 Type 2 diabetes mellitus with hyperglycemia: Secondary | ICD-10-CM | POA: Diagnosis not present

## 2020-01-09 DIAGNOSIS — E7849 Other hyperlipidemia: Secondary | ICD-10-CM | POA: Diagnosis not present

## 2020-01-15 DIAGNOSIS — R69 Illness, unspecified: Secondary | ICD-10-CM | POA: Diagnosis not present

## 2020-01-16 ENCOUNTER — Telehealth: Payer: Self-pay | Admitting: *Deleted

## 2020-01-16 DIAGNOSIS — I739 Peripheral vascular disease, unspecified: Secondary | ICD-10-CM

## 2020-01-16 MED ORDER — APIXABAN 5 MG PO TABS: ORAL_TABLET | ORAL | 5 refills | Status: DC

## 2020-01-16 NOTE — Telephone Encounter (Signed)
Eliquis 5mg  refill request received. Patient is 54 years old, weight-73.5kg, Crea-0.67 on 1/13/20201, Diagnosis-stent graft thoracic aorta for embolus-per Dr. Roswell Miners last OV note he was going to message Dr. Scot Dock as he is unsure how long the pt will be on Eliquis, and last seen by Dr. Johnsie Cancel on 1/4/20201.  Dose is appropriate based on dosing criteria. Will send in refill to requested pharmacy.

## 2020-01-17 ENCOUNTER — Other Ambulatory Visit: Payer: Self-pay

## 2020-01-17 ENCOUNTER — Ambulatory Visit: Payer: Medicare HMO | Attending: Internal Medicine

## 2020-01-17 DIAGNOSIS — Z23 Encounter for immunization: Secondary | ICD-10-CM

## 2020-01-17 NOTE — Progress Notes (Signed)
   Covid-19 Vaccination Clinic  Name:  Marissa [name]    MRN: [id number] DOB: 03-10-1966  01/17/2020  [name] was observed post Covid-19 immunization for 15 minutes without incident. She was provided with Vaccine Information Sheet and instruction to access the V-Safe system.   [name] was instructed to call 911 with any severe reactions post vaccine: Marland Kitchen Difficulty breathing  . Swelling of face and throat  . A fast heartbeat  . A bad rash all over body  . Dizziness and weakness   Immunizations Administered    Name Date Dose VIS Date Route   Moderna COVID-19 Vaccine 01/17/2020  9:55 AM 0.5 mL 09/11/2019 Intramuscular   Manufacturer: Moderna   Lot: [id number]   [place]: [id number]

## 2020-01-23 ENCOUNTER — Ambulatory Visit (INDEPENDENT_AMBULATORY_CARE_PROVIDER_SITE_OTHER): Payer: Medicare HMO | Admitting: Internal Medicine

## 2020-01-23 ENCOUNTER — Encounter: Payer: Self-pay | Admitting: Internal Medicine

## 2020-01-23 ENCOUNTER — Other Ambulatory Visit: Payer: Self-pay

## 2020-01-23 VITALS — BP 110/62 | HR 86 | Temp 98.0°F | Ht 65.0 in | Wt 160.8 lb

## 2020-01-23 DIAGNOSIS — Z794 Long term (current) use of insulin: Secondary | ICD-10-CM

## 2020-01-23 DIAGNOSIS — E1165 Type 2 diabetes mellitus with hyperglycemia: Secondary | ICD-10-CM | POA: Diagnosis not present

## 2020-01-23 DIAGNOSIS — E1159 Type 2 diabetes mellitus with other circulatory complications: Secondary | ICD-10-CM | POA: Diagnosis not present

## 2020-01-23 DIAGNOSIS — E1169 Type 2 diabetes mellitus with other specified complication: Secondary | ICD-10-CM

## 2020-01-23 LAB — POCT GLYCOSYLATED HEMOGLOBIN (HGB A1C): Hemoglobin A1C: 7.3 % — AB (ref 4.0–5.6)

## 2020-01-23 MED ORDER — NOVOLOG MIX 70/30 FLEXPEN (70-30) 100 UNIT/ML ~~LOC~~ SUPN: PEN_INJECTOR | SUBCUTANEOUS | 11 refills | Status: DC

## 2020-01-23 MED ORDER — METFORMIN HCL 500 MG PO TABS: 500.0000 mg | ORAL_TABLET | Freq: Two times a day (BID) | ORAL | 3 refills | Status: DC

## 2020-01-23 MED ORDER — JARDIANCE 10 MG PO TABS: 10.0000 mg | ORAL_TABLET | Freq: Every day | ORAL | 1 refills | Status: DC

## 2020-01-23 NOTE — Progress Notes (Signed)
Name: Marissa [name]  Age/ Sex: 54 y.o., female   MRN/ DOB: [id number], [birth year]     PCP: Redmond School, MD   Reason for Endocrinology Evaluation: Type 2 Diabetes Mellitus  Initial Endocrine Consultative Visit: 06/04/2019    [name] IDENTIFIER: Marissa [name] is a 54 y.o. female with a past medical history of HTN,T2DM, Dyslipidemia and Lung Ca (2006) . Marissa [name] has followed with Endocrinology clinic since 06/03/2001 for consultative assistance with management of her diabetes.  DIABETIC HISTORY:  Marissa [name] was diagnosed with T2DM in 2006. She has been on oral glycemic agents in Marissa past ( Glipizide, glimepiride, metformin, Farxiga ( not covered) , insulin added in 01/2019. Her hemoglobin A1c has ranged from 6.7% in 2015, peaking at 13.8% in 02/2019.   On her initial visit to our clinic her A1c was 10.8%  , she was on Glimepiride, Metformin and Levemir. We stopped Levemir, and Glimepiride , started insulin Mix and reduced metformin dose due to nausea and diarrhea.  Jardiance started 10/2019  SUBJECTIVE:   During Marissa last visit (10/24/2019): A1c 8.2%. We continued metformin and insulin mix and started Jardiance    Today (01/23/2020): Marissa [name] is here for a 3 month follow up on diabetes management. She checks her blood sugars 2  times daily, preprandial to breakfast and supper. Marissa [name] has not had hypoglycemic episodes since Marissa last clinic visit.    No nausea but has diarrhea.   ROS: As per HPI and as detailed below: Review of Systems  Gastrointestinal: Negative for diarrhea and nausea.  Genitourinary: Positive for frequency.      HOME DIABETES REGIMEN:  Metformin 500 mg ,1 tablet BID  Novolog Mix 18 units with Breakfast and 22 units with Supper  Jardiance 10 mg daily      METER DOWNLOAD SUMMARY: Did not bring     DIABETIC COMPLICATIONS: Microvascular complications:   Neuropathy   Denies: CKD , retinopathy   Last eye exam: Completed  04/2018  Macrovascular complications:   CAD ( Pending PCI next week)   Denies:  PVD, CVA   HISTORY:  Past Medical History:  Past Medical History:  Diagnosis Date  . Adenocarcinoma of lung ([place]) 02/06/2014  . Anxiety   . Arthritis   . COPD (chronic obstructive pulmonary disease) ([place])   . Coronary artery disease   . Diabetes mellitus   . GERD (gastroesophageal reflux disease)   . Hypertension   . Hypothyroidism   . Lung cancer [facility])    Past Surgical History:  Past Surgical History:  Procedure Laterality Date  . APPENDECTOMY    . CHOLECYSTECTOMY    . LOBECTOMY Left 2006  . NM MYOCAR IMG MI  05/2008   lexiscan -perfusion defect in anterior myocardium (breast attenuation); remaining myocardium with NO evidence of ischemia or infarct; EF 78%; low risk scan  . OOPHORECTOMY Left   . PARTIAL HYSTERECTOMY  1985  . SALPINGOOPHORECTOMY Left    benign 9lb mass removed and a massive wall of smaller tumors removed  . THORACIC AORTIC ENDOVASCULAR STENT GRAFT N/A 06/04/2019   Procedure: THORACIC AORTIC ENDOVASCULAR STENT GRAFT;  Surgeon: Angelia Mould, MD;  Location: [facility] OR;  Service: Vascular;  Laterality: N/A;  . TRANSTHORACIC ECHOCARDIOGRAM  05/2008   EF =/>55%, mild MR; trace TR    Social History:  reports that she has been smoking cigarettes. She has a 8.00 pack-year smoking history. She has never used smokeless tobacco. She reports that she does not drink  alcohol or use drugs. Family History:  Family History  Problem Relation Age of Onset  . Colon cancer Mother   . Lung cancer Mother   . Heart disease Mother   . Heart disease Father   . Ovarian cancer Sister   . Hepatitis Brother   . Hernia Brother      HOME MEDICATIONS: Allergies as of 01/23/2020      Reactions   Bee Venom Anaphylaxis   Penicillins Swelling, Rash, Other (See Comments)   Did it involve swelling of Marissa face/tongue/throat, SOB, or low BP? Yes-whole body (swelling) Did it involve sudden or severe  rash/hives, skin peeling, or any reaction on Marissa inside of your mouth or nose? Yes-Hives Did you need to seek medical attention at a hospital or doctor's office? Yes When did it last happen?Childhood If all above answers are "NO", may proceed with cephalosporin use.   Poison Oak Extract Anaphylaxis      Medication List       Accurate as of January 23, 2020  1:57 PM. If you have any questions, ask your nurse or doctor.        albuterol-ipratropium 18-103 MCG/ACT inhaler Commonly known as: COMBIVENT Inhale 2 puffs into Marissa lungs every 6 (six) hours as needed for wheezing or shortness of breath.   ALPRAZolam 0.5 MG tablet Commonly known as: XANAX Take 0.5 mg by mouth as needed. Take 0.5 mg at bedtime every night. Take 0.5mg  once a day as needed for anxiety.   apixaban 5 MG Tabs tablet Commonly known as: Eliquis TAKE 1 TABLET(5 MG) BY MOUTH TWICE DAILY   aspirin EC 81 MG tablet Take 81 mg by mouth daily.   atorvastatin 80 MG tablet Commonly known as: LIPITOR Take 1 tablet (80 mg total) by mouth daily at 6 PM.   busPIRone 30 MG tablet Commonly known as: BUSPAR Take 15 mg by mouth 2 (two) times a day.   Doxepin HCl 6 MG Tabs Take 6 mg by mouth at bedtime.   EpiPen 2-Pak 0.3 mg/0.3 mL Soaj injection Generic drug: EPINEPHrine Inject 0.3 mg into Marissa muscle as needed for anaphylaxis. Has available   esomeprazole 40 MG capsule Commonly known as: NEXIUM Take 1 capsule (40 mg total) by mouth daily before breakfast.   ezetimibe 10 MG tablet Commonly known as: ZETIA Take 10 mg by mouth daily.   fish oil-omega-3 fatty acids 1000 MG capsule Take 2 g by mouth 2 (two) times a day. 4 tablets daily   glucose blood test strip Use as instructed to test blood sugars 7 times daily E11.65   Insulin Pen Needle 32G X 4 MM Misc 2x daily   Jardiance 10 MG Tabs tablet Generic drug: empagliflozin Take 10 mg by mouth daily before breakfast.   levothyroxine 50 MCG tablet Commonly  known as: SYNTHROID Take 1 tablet (50 mcg total) by mouth daily.   lisinopril 10 MG tablet Commonly known as: ZESTRIL Take 1 tablet (10 mg total) by mouth daily.   metFORMIN 500 MG tablet Commonly known as: Glucophage Take 1 tablet (500 mg total) by mouth 2 (two) times daily with a meal.   NovoLOG Mix 70/30 FlexPen (70-30) 100 UNIT/ML FlexPen Generic drug: insulin aspart protamine - aspart Inject 0.14 mLs (14 Units total) into Marissa skin daily with breakfast AND 0.22 mLs (22 Units total) daily with supper. What changed: See Marissa new instructions. Changed by: Dorita Sciara, MD   oxyCODONE 15 MG immediate release tablet Commonly known as: ROXICODONE  Take 15 mg by mouth every 4 (four) hours as needed for pain.        OBJECTIVE:   Vital Signs: BP 110/62 (BP Location: Left Arm, [name] Position: Sitting, Cuff Size: Large)   Pulse 86   Temp 98 F (36.7 C)   Ht 5\' 5"  (1.651 m)   Wt 160 lb 12.8 oz (72.9 kg)   SpO2 96%   BMI 26.76 kg/m   Wt Readings from Last 3 Encounters:  01/23/20 160 lb 12.8 oz (72.9 kg)  11/23/19 162 lb (73.5 kg)  10/29/19 160 lb (72.6 kg)     Exam: General: Marissa [name] appears well and is in NAD  Lungs: Clear with good BS bilat with no rales, rhonchi, or wheezes  Heart: RRR with normal S1 and S2 and no gallops; no murmurs; no rub  Abdomen: Normoactive bowel sounds, soft, nontender, without masses or organomegaly palpable  Extremities: No pretibial edema.   Neuro: MS is good with appropriate affect, Marissa [name] is alert and Ox3       DM foot exam: 10/25/2019  Marissa skin of Marissa feet is intact without sores or ulcerations. Marissa pedal pulses are 2+ on right and 2+ on left. Marissa sensation is decreased to a screening 5.07, 10 gram monofilament bilaterally   DATA REVIEWED:  Lab Results  Component Value Date   HGBA1C 7.3 (A) 01/23/2020   HGBA1C 8.2 (A) 10/24/2019   HGBA1C 10.8 (A) 06/01/2019   Results for ELLIA, KNOWLTON (MRN [id number]) as of 01/23/2020 13:10   Ref. Range 10/24/2019 13:55  Sodium Latest Ref Range: 135 - 145 mEq/L 136  Potassium Latest Ref Range: 3.5 - 5.1 mEq/L 3.8  Chloride Latest Ref Range: 96 - 112 mEq/L 103  CO2 Latest Ref Range: 19 - 32 mEq/L 23  Glucose Latest Ref Range: 70 - 99 mg/dL 204 (H)  BUN Latest Ref Range: 6 - 23 mg/dL 12  Creatinine Latest Ref Range: 0.40 - 1.20 mg/dL 0.67  Calcium Latest Ref Range: 8.4 - 10.5 mg/dL 9.7    ASSESSMENT / PLAN / RECOMMENDATIONS:   1) Type 2 Diabetes Mellitus,Sub- Optimally  controlled, With Neuropathic and Macrovascular  complications - Most recent A1c of 7.3 %. Goal A1c < 7.0 %.  Down from 10.8%  - I have praised Marissa [name] on her awesome work on controlling her glucose.  -She is tolerating Marissa Jardiance without any side effects. -[name] tells me if her BG is less than 130 mg/DL she holds Marissa insulin, I have explained to Marissa [name] that insulin should be taken with meals, and that if her BG premeal is 130 or less, after Marissa meal her BG is probably going to reach 160 280 MGs/DL.  I have advised her that if she is concerned about hypoglycemia she could at least take 50% of Marissa dose. -In Marissa meantime I am going to reduce her morning dose due to her reported tight BG in Marissa evenings. -We again discussed Marissa importance of having glucose data during visits.   MEDICATIONS:  Continue Metformin 500 mg BID  Novolog Mix 14 units with Breakfast and 22 units with Supper    Jardiance 10 mg daily   EDUCATION / INSTRUCTIONS:  BG monitoring instructions: [name] is instructed to check her blood sugars 2 times a day, breakfast and supper.  Call [facility] if: BG persistently < 70 or > 300. . I reviewed Marissa Rule of 15 for Marissa treatment of hypoglycemia in detail with Marissa [name]. Literature supplied.  F/U in 4 months    Signed electronically by: Mack Guise, MD  [facility]  [address] [place], [place] [address] Phone: [phone] FAX: [phone]   CC: [address], [place] [address] Phone: [phone]  Fax: [phone]  Return to Endocrinology clinic as below: Future Appointments  Date Time Provider [place]  01/30/2020  1:00 PM Arlana Lindau, RD NDM-NDMR None  02/19/2020  9:45 AM [facility] BAP [place] PEC-PEC PEC  05/22/2020  9:00 AM AP-ACAPA LAB AP-ACAPA None  05/22/2020 10:00 AM AP-CT 1 AP-CT [place] [facility]  05/23/2020 11:30 AM Glennie [place], NP-C AP-ACAPA None  05/26/2020  1:00 PM Adrik Khim, Melanie Crazier, MD LBPC-LBENDO None

## 2020-01-23 NOTE — Patient Instructions (Addendum)
-   Keep up the good work !! You have done an Media planner job.  - Metformin ONE tablet with Breakfast and ONE tablet with Supper - Novolog Mix 14 units with Breakfast and 22 units with Supper  - Jardiance 10 mg with breakfast       -HOW TO TREAT LOW BLOOD SUGARS (Blood sugar LESS THAN 70 MG/DL)  Please follow the RULE OF 15 for the treatment of hypoglycemia treatment (when your (blood sugars are less than 70 mg/dL)    STEP 1: Take 15 grams of carbohydrates when your blood sugar is low, which includes:   3-4 GLUCOSE TABS  OR  3-4 OZ OF JUICE OR REGULAR SODA OR  ONE TUBE OF GLUCOSE GEL     STEP 2: RECHECK blood sugar in 15 MINUTES STEP 3: If your blood sugar is still low at the 15 minute recheck --> then, go back to STEP 1 and treat AGAIN with another 15 grams of carbohydrates.

## 2020-01-30 ENCOUNTER — Ambulatory Visit: Payer: Medicare HMO | Admitting: Nutrition

## 2020-02-08 DIAGNOSIS — E7849 Other hyperlipidemia: Secondary | ICD-10-CM | POA: Diagnosis not present

## 2020-02-08 DIAGNOSIS — G894 Chronic pain syndrome: Secondary | ICD-10-CM | POA: Diagnosis not present

## 2020-02-08 DIAGNOSIS — E1165 Type 2 diabetes mellitus with hyperglycemia: Secondary | ICD-10-CM | POA: Diagnosis not present

## 2020-02-08 DIAGNOSIS — R69 Illness, unspecified: Secondary | ICD-10-CM | POA: Diagnosis not present

## 2020-02-08 DIAGNOSIS — I10 Essential (primary) hypertension: Secondary | ICD-10-CM | POA: Diagnosis not present

## 2020-02-12 DIAGNOSIS — R69 Illness, unspecified: Secondary | ICD-10-CM | POA: Diagnosis not present

## 2020-02-19 ENCOUNTER — Ambulatory Visit: Payer: Medicare HMO | Attending: Internal Medicine

## 2020-02-19 DIAGNOSIS — Z23 Encounter for immunization: Secondary | ICD-10-CM

## 2020-02-19 NOTE — Progress Notes (Signed)
   Covid-19 Vaccination Clinic  Name:  Marissa [name]    MRN: [id number] DOB: 19-May-1966  02/19/2020  [name] was observed post Covid-19 immunization for 15 minutes without incident. She was provided with Vaccine Information Sheet and instruction to access the V-Safe system.   [name] was instructed to call 911 with any severe reactions post vaccine: Marland Kitchen Difficulty breathing  . Swelling of face and throat  . A fast heartbeat  . A bad rash all over body  . Dizziness and weakness   Immunizations Administered    Name Date Dose VIS Date Route   Moderna COVID-19 Vaccine 02/19/2020 10:01 AM 0.5 mL 09/2019 Intramuscular   Manufacturer: Moderna   Lot: [id number]   [place]: [id number]

## 2020-02-26 ENCOUNTER — Ambulatory Visit: Payer: Medicare HMO | Admitting: Nutrition

## 2020-03-05 DIAGNOSIS — Z6827 Body mass index (BMI) 27.0-27.9, adult: Secondary | ICD-10-CM | POA: Diagnosis not present

## 2020-03-05 DIAGNOSIS — G894 Chronic pain syndrome: Secondary | ICD-10-CM | POA: Diagnosis not present

## 2020-03-05 DIAGNOSIS — I10 Essential (primary) hypertension: Secondary | ICD-10-CM | POA: Diagnosis not present

## 2020-03-05 DIAGNOSIS — E1140 Type 2 diabetes mellitus with diabetic neuropathy, unspecified: Secondary | ICD-10-CM | POA: Diagnosis not present

## 2020-03-05 DIAGNOSIS — G4700 Insomnia, unspecified: Secondary | ICD-10-CM | POA: Diagnosis not present

## 2020-03-05 DIAGNOSIS — E7849 Other hyperlipidemia: Secondary | ICD-10-CM | POA: Diagnosis not present

## 2020-03-11 DIAGNOSIS — R69 Illness, unspecified: Secondary | ICD-10-CM | POA: Diagnosis not present

## 2020-04-09 DIAGNOSIS — I10 Essential (primary) hypertension: Secondary | ICD-10-CM | POA: Diagnosis not present

## 2020-04-09 DIAGNOSIS — E1165 Type 2 diabetes mellitus with hyperglycemia: Secondary | ICD-10-CM | POA: Diagnosis not present

## 2020-04-09 DIAGNOSIS — R69 Illness, unspecified: Secondary | ICD-10-CM | POA: Diagnosis not present

## 2020-04-09 DIAGNOSIS — G894 Chronic pain syndrome: Secondary | ICD-10-CM | POA: Diagnosis not present

## 2020-04-21 DIAGNOSIS — R69 Illness, unspecified: Secondary | ICD-10-CM | POA: Diagnosis not present

## 2020-05-09 DIAGNOSIS — G894 Chronic pain syndrome: Secondary | ICD-10-CM | POA: Diagnosis not present

## 2020-05-09 DIAGNOSIS — I10 Essential (primary) hypertension: Secondary | ICD-10-CM | POA: Diagnosis not present

## 2020-05-09 DIAGNOSIS — E7849 Other hyperlipidemia: Secondary | ICD-10-CM | POA: Diagnosis not present

## 2020-05-09 DIAGNOSIS — E1165 Type 2 diabetes mellitus with hyperglycemia: Secondary | ICD-10-CM | POA: Diagnosis not present

## 2020-05-09 DIAGNOSIS — R69 Illness, unspecified: Secondary | ICD-10-CM | POA: Diagnosis not present

## 2020-05-22 ENCOUNTER — Other Ambulatory Visit (HOSPITAL_COMMUNITY): Payer: Self-pay | Admitting: Internal Medicine

## 2020-05-22 ENCOUNTER — Ambulatory Visit (HOSPITAL_COMMUNITY)
Admission: RE | Admit: 2020-05-22 | Discharge: 2020-05-22 | Disposition: A | Discharge status: Home / Self Care | Payer: Medicare HMO | Source: Ambulatory Visit | Attending: Nurse Practitioner | Admitting: Nurse Practitioner

## 2020-05-22 ENCOUNTER — Inpatient Hospital Stay (HOSPITAL_COMMUNITY): Payer: Medicare HMO | Attending: Hematology

## 2020-05-22 ENCOUNTER — Other Ambulatory Visit: Payer: Self-pay

## 2020-05-22 DIAGNOSIS — Z902 Acquired absence of lung [part of]: Secondary | ICD-10-CM | POA: Diagnosis not present

## 2020-05-22 DIAGNOSIS — C3492 Malignant neoplasm of unspecified part of left bronchus or lung: Secondary | ICD-10-CM | POA: Insufficient documentation

## 2020-05-22 DIAGNOSIS — I700 Atherosclerosis of aorta: Secondary | ICD-10-CM | POA: Diagnosis not present

## 2020-05-22 DIAGNOSIS — C3412 Malignant neoplasm of upper lobe, left bronchus or lung: Secondary | ICD-10-CM | POA: Insufficient documentation

## 2020-05-22 DIAGNOSIS — Z1231 Encounter for screening mammogram for malignant neoplasm of breast: Secondary | ICD-10-CM

## 2020-05-22 DIAGNOSIS — E559 Vitamin D deficiency, unspecified: Secondary | ICD-10-CM | POA: Insufficient documentation

## 2020-05-22 DIAGNOSIS — M47814 Spondylosis without myelopathy or radiculopathy, thoracic region: Secondary | ICD-10-CM | POA: Diagnosis not present

## 2020-05-22 DIAGNOSIS — I2510 Atherosclerotic heart disease of native coronary artery without angina pectoris: Secondary | ICD-10-CM | POA: Diagnosis not present

## 2020-05-22 DIAGNOSIS — Z801 Family history of malignant neoplasm of trachea, bronchus and lung: Secondary | ICD-10-CM | POA: Diagnosis not present

## 2020-05-22 DIAGNOSIS — Z8041 Family history of malignant neoplasm of ovary: Secondary | ICD-10-CM | POA: Diagnosis not present

## 2020-05-22 DIAGNOSIS — R911 Solitary pulmonary nodule: Secondary | ICD-10-CM | POA: Diagnosis not present

## 2020-05-22 LAB — CBC WITH DIFFERENTIAL/PLATELET
Abs Immature Granulocytes: 0.07 10*3/uL (ref 0.00–0.07)
Basophils Absolute: 0.1 10*3/uL (ref 0.0–0.1)
Basophils Relative: 1 %
Eosinophils Absolute: 0.4 10*3/uL (ref 0.0–0.5)
Eosinophils Relative: 3 %
HCT: 47.8 % — ABNORMAL HIGH (ref 36.0–46.0)
Hemoglobin: 15.2 g/dL — ABNORMAL HIGH (ref 12.0–15.0)
Immature Granulocytes: 1 %
Lymphocytes Relative: 30 %
Lymphs Abs: 3.5 10*3/uL (ref 0.7–4.0)
MCH: 30 pg (ref 26.0–34.0)
MCHC: 31.8 g/dL (ref 30.0–36.0)
MCV: 94.3 fL (ref 80.0–100.0)
Monocytes Absolute: 1 10*3/uL (ref 0.1–1.0)
Monocytes Relative: 9 %
Neutro Abs: 6.7 10*3/uL (ref 1.7–7.7)
Neutrophils Relative %: 56 %
Platelets: 344 10*3/uL (ref 150–400)
RBC: 5.07 MIL/uL (ref 3.87–5.11)
RDW: 14 % (ref 11.5–15.5)
WBC: 11.8 10*3/uL — ABNORMAL HIGH (ref 4.0–10.5)
nRBC: 0 % (ref 0.0–0.2)

## 2020-05-22 LAB — COMPREHENSIVE METABOLIC PANEL
ALT: 31 U/L (ref 0–44)
AST: 17 U/L (ref 15–41)
Albumin: 4.2 g/dL (ref 3.5–5.0)
Alkaline Phosphatase: 98 U/L (ref 38–126)
Anion gap: 12 (ref 5–15)
BUN: 15 mg/dL (ref 6–20)
CO2: 23 mmol/L (ref 22–32)
Calcium: 9.2 mg/dL (ref 8.9–10.3)
Chloride: 103 mmol/L (ref 98–111)
Creatinine, Ser: 0.63 mg/dL (ref 0.44–1.00)
GFR calc Af Amer: >60 mL/min (ref >60)
GFR calc non Af Amer: >60 mL/min (ref >60)
Glucose, Bld: 182 mg/dL — ABNORMAL HIGH (ref 70–99)
Potassium: 3.8 mmol/L (ref 3.5–5.1)
Sodium: 138 mmol/L (ref 135–145)
Total Bilirubin: 0.6 mg/dL (ref 0.3–1.2)
Total Protein: 7.3 g/dL (ref 6.5–8.1)

## 2020-05-22 LAB — VITAMIN B12: Vitamin B-12: 202 pg/mL (ref 180–914)

## 2020-05-22 LAB — LACTATE DEHYDROGENASE: LDH: 120 U/L (ref 98–192)

## 2020-05-22 LAB — VITAMIN D 25 HYDROXY (VIT D DEFICIENCY, FRACTURES): Vit D, 25-Hydroxy: 12.13 ng/mL — ABNORMAL LOW (ref 30–100)

## 2020-05-22 MED ORDER — IOHEXOL 300 MG/ML  SOLN
75.0000 mL | Freq: Once | INTRAMUSCULAR | Status: AC | PRN
  Administered 2020-05-22: 75 mL via INTRAVENOUS

## 2020-05-23 ENCOUNTER — Inpatient Hospital Stay (HOSPITAL_COMMUNITY): Payer: Medicare HMO | Admitting: Hematology

## 2020-05-23 VITALS — BP 121/69 | HR 99 | Temp 96.9°F | Resp 20 | Wt 166.1 lb

## 2020-05-23 DIAGNOSIS — C3412 Malignant neoplasm of upper lobe, left bronchus or lung: Secondary | ICD-10-CM | POA: Diagnosis not present

## 2020-05-23 DIAGNOSIS — C3492 Malignant neoplasm of unspecified part of left bronchus or lung: Secondary | ICD-10-CM | POA: Diagnosis not present

## 2020-05-23 MED ORDER — ERGOCALCIFEROL 1.25 MG (50000 UT) PO CAPS
50000.0000 [IU] | ORAL_CAPSULE | ORAL | 2 refills | Status: DC
Start: 2020-05-23 — End: 2020-10-17

## 2020-05-23 NOTE — Patient Instructions (Signed)
Central at [facility] Discharge Instructions  You were seen today by Dr. Delton Coombes. He went over your recent results. You will be scheduled for a CT scan of your chest before your next visit. You will be prescribed vitamin D to take once a week. Dr. Delton Coombes will see you back in 6 months for labs and follow up.   Thank you for choosing [place] at [facility] to provide your oncology and hematology care.  To afford each patient quality time with our provider, please arrive at least 15 minutes before your scheduled appointment time.   If you have a lab appointment with the [place] please come in thru the Main Entrance and check in at the main information desk  You need to re-schedule your appointment should you arrive 10 or more minutes late.  We strive to give you quality time with our providers, and arriving late affects you and other patients whose appointments are after yours.  Also, if you no show three or more times for appointments you may be dismissed from the clinic at the providers discretion.     Again, thank you for choosing [facility].  Our hope is that these requests will decrease the amount of time that you wait before being seen by our physicians.       _____________________________________________________________  Should you have questions after your visit to [facility], please contact our office at [phone] between the hours of 8:00 a.m. and 4:30 p.m.  Voicemails left after 4:00 p.m. will not be returned until the following business day.  For prescription refill requests, have your pharmacy contact our office and allow 72 hours.    Cancer Center Support Programs:   > Cancer Support Group  2nd Tuesday of the month 1pm-2pm, Journey Room

## 2020-05-23 NOTE — Progress Notes (Signed)
Marissa [name], [place] [id number]   CLINIC:  Medical Oncology/Hematology  PCP:  [facility], [place] / [place] [address] [phone]   REASON FOR VISIT:  Follow-up for adenocarcinoma of left lung  PRIOR THERAPY: Left upper lobectomy on 03/24/2015  NGS Results: Not done  CURRENT THERAPY: Surveillance  BRIEF ONCOLOGIC HISTORY:  Oncology History  Adenocarcinoma of lung ([place])  03/23/2005 Initial Diagnosis   Adenocarcinoma of lung with left upper lobe wedge resection by Dr. Arlyce Dice measuring 1.4 cm.  0/4 lymph nodes.   01/27/2015 Imaging   CT chest- 1. Stable examination demonstrating multiple tiny 2-3 mm pulmonary nodules in the right upper lobe which are likely benign areas of mucoid impaction in terminal bronchioles, in addition to an unchanged 6 x 5 mm ground-glass attenuation nodule in the apex of the right upper lobe. Continued attention on annual followup examinations is recommended to ensure continued stability. 2. Status post left upper lobectomy. No findings to suggest local recurrence of disease. 3. Atherosclerosis, including three-vessel coronary artery disease. Please note that although the presence of coronary artery calcium documents the presence of coronary artery disease, the severity of this disease and any potential stenosis cannot be assessed on this non-gated CT examination. Assessment for potential risk factor modification, dietary therapy or pharmacologic therapy may be warranted, if clinically indicated.   04/19/2017 Imaging   CT chest- 1. No evidence lung cancer recurrence in the LEFT lower lung. 2. Increase in the ground-glass surrounding a RIGHT upper lobe pulmonary nodule. Minimal size increased over 3 year interval. Recommend follow-up CT in 12 months for part solid nodule. This recommendation follows the consensus statement: Guidelines for Management of Incidental Pulmonary Nodules  Detected on CT Images: From the Fleischner Society 2017; Radiology 2017; 284:228-243. These results will be called to the ordering clinician or representative by the Radiologist Assistant, and communication documented in the PACS or zVision Dashboard.     CANCER STAGING: Cancer Staging Adenocarcinoma of lung ([place]) Staging form: Lung, AJCC 7th Edition - Clinical: Stage IA (T1a, N0, M0) - Signed by Baird Cancer, PA-C on 02/06/2014   INTERVAL HISTORY:  Marissa [name], a 55 y.o. female, returns for routine follow-up of her adenocarcinoma of the left lung. Amreen was last seen on 05/02/2018.   She continues smoking 1 PPD. Her cough is intensifying over the past 4-5 months, but unproductive and without hemoptysis.   REVIEW OF SYSTEMS:  Review of Systems  Constitutional: Negative for appetite change and fatigue.  Respiratory: Positive for cough (unproductive and intensifying). Negative for hemoptysis.   Musculoskeletal: Positive for flank pain (10/10 flank pain).  All other systems reviewed and are negative.   PAST MEDICAL/SURGICAL HISTORY:  Past Medical History:  Diagnosis Date  . Adenocarcinoma of lung ([place]) 02/06/2014  . Anxiety   . Arthritis   . COPD (chronic obstructive pulmonary disease) ([place])   . Coronary artery disease   . Diabetes mellitus   . GERD (gastroesophageal reflux disease)   . Hypertension   . Hypothyroidism   . Lung cancer [facility])    Past Surgical History:  Procedure Laterality Date  . APPENDECTOMY    . CHOLECYSTECTOMY    . LOBECTOMY Left 2006  . NM MYOCAR IMG MI  05/2008   lexiscan -perfusion defect in anterior myocardium (breast attenuation); remaining myocardium with NO evidence of ischemia or infarct; EF 78%; low risk scan  . OOPHORECTOMY Left   . PARTIAL HYSTERECTOMY  1985  .  SALPINGOOPHORECTOMY Left    benign 9lb mass removed and a massive wall of smaller tumors removed  . THORACIC AORTIC ENDOVASCULAR STENT GRAFT N/A 06/04/2019   Procedure:  THORACIC AORTIC ENDOVASCULAR STENT GRAFT;  Surgeon: Angelia Mould, MD;  Location: [facility] OR;  Service: Vascular;  Laterality: N/A;  . TRANSTHORACIC ECHOCARDIOGRAM  05/2008   EF =/>55%, mild MR; trace TR    SOCIAL HISTORY:  Social History   Socioeconomic History  . Marital status: Married    Spouse name: Not on file  . Number of children: Not on file  . Years of education: Not on file  . Highest education level: Not on file  Occupational History  . Not on file  Tobacco Use  . Smoking status: Current Every Day Smoker    Packs/day: 0.25    Years: 32.00    Pack years: 8.00    Types: Cigarettes  . Smokeless tobacco: Never Used  Vaping Use  . Vaping Use: Never used  Substance and Sexual Activity  . Alcohol use: No  . Drug use: No  . Sexual activity: Not on file  Other Topics Concern  . Not on file  Social History Narrative  . Not on file   Social Determinants of Health   Financial Resource Strain:   . Difficulty of Paying Living Expenses:   Food Insecurity:   . Worried About Charity fundraiser in the Last Year:   . Arboriculturist in the Last Year:   Transportation Needs:   . Film/video editor (Medical):   Marland Kitchen Lack of Transportation (Non-Medical):   Physical Activity:   . Days of Exercise per Week:   . Minutes of Exercise per Session:   Stress:   . Feeling of Stress :   Social Connections:   . Frequency of Communication with Friends and Family:   . Frequency of Social Gatherings with Friends and Family:   . Attends Religious Services:   . Active Member of Clubs or Organizations:   . Attends Archivist Meetings:   Marland Kitchen Marital Status:   Intimate Partner Violence:   . Fear of Current or Ex-Partner:   . Emotionally Abused:   Marland Kitchen Physically Abused:   . Sexually Abused:     FAMILY HISTORY:  Family History  Problem Relation Age of Onset  . Colon cancer Mother   . Lung cancer Mother   . Heart disease Mother   . Heart disease Father   . Ovarian  cancer Sister   . Hepatitis Brother   . Hernia Brother     CURRENT MEDICATIONS:  Current Outpatient Medications  Medication Sig Dispense Refill  . albuterol-ipratropium (COMBIVENT) 18-103 MCG/ACT inhaler Inhale 2 puffs into the lungs every 6 (six) hours as needed for wheezing or shortness of breath. 1 Inhaler 1  . ALPRAZolam (XANAX) 0.5 MG tablet Take 0.5 mg by mouth as needed. Take 0.5 mg at bedtime every night. Take 0.5mg  once a day as needed for anxiety.    Marland Kitchen apixaban (ELIQUIS) 5 MG TABS tablet TAKE 1 TABLET(5 MG) BY MOUTH TWICE DAILY 60 tablet 5  . aspirin EC 81 MG tablet Take 81 mg by mouth daily.    Marland Kitchen atorvastatin (LIPITOR) 80 MG tablet Take 1 tablet (80 mg total) by mouth daily at 6 PM. 30 tablet 6  . busPIRone (BUSPAR) 30 MG tablet Take 15 mg by mouth 2 (two) times a day.     . Doxepin HCl 6 MG TABS Take 6  mg by mouth at bedtime.    . empagliflozin (JARDIANCE) 10 MG TABS tablet Take 10 mg by mouth daily before breakfast. 90 tablet 1  . EPIPEN 2-PAK 0.3 MG/0.3ML SOAJ injection Inject 0.3 mg into the muscle as needed for anaphylaxis. Has available  1  . esomeprazole (NEXIUM) 40 MG capsule Take 1 capsule (40 mg total) by mouth daily before breakfast. 30 capsule 3  . ezetimibe (ZETIA) 10 MG tablet Take 10 mg by mouth daily.      . fish oil-omega-3 fatty acids 1000 MG capsule Take 2 g by mouth 2 (two) times a day. 4 tablets daily     . glucose blood test strip Use as instructed to test blood sugars 7 times daily E11.65 500 each 2  . insulin aspart protamine - aspart (NOVOLOG MIX 70/30 FLEXPEN) (70-30) 100 UNIT/ML FlexPen Inject 0.14 mLs (14 Units total) into the skin daily with breakfast AND 0.22 mLs (22 Units total) daily with supper. 15 mL 11  . Insulin Pen Needle 32G X 4 MM MISC 2x daily 100 each 11  . levothyroxine (SYNTHROID) 50 MCG tablet Take 1 tablet (50 mcg total) by mouth daily. 30 tablet 3  . lisinopril (ZESTRIL) 10 MG tablet Take 1 tablet (10 mg total) by mouth daily. 30 tablet  5  . metFORMIN (GLUCOPHAGE) 500 MG tablet Take 1 tablet (500 mg total) by mouth 2 (two) times daily with a meal. 180 tablet 3  . oxyCODONE (ROXICODONE) 15 MG immediate release tablet Take 15 mg by mouth every 4 (four) hours as needed for pain.     . COMBIVENT RESPIMAT 20-100 MCG/ACT AERS respimat Inhale 2 puffs into the lungs 4 (four) times daily.    Marland Kitchen zolpidem (AMBIEN) 10 MG tablet Take 10 mg by mouth at bedtime as needed.     No current facility-administered medications for this visit.    ALLERGIES:  Allergies  Allergen Reactions  . Bee Venom Anaphylaxis  . Penicillins Swelling, Rash and Other (See Comments)    Did it involve swelling of the face/tongue/throat, SOB, or low BP? Yes-whole body (swelling) Did it involve sudden or severe rash/hives, skin peeling, or any reaction on the inside of your mouth or nose? Yes-Hives Did you need to seek medical attention at a hospital or doctor's office? Yes When did it last happen?Childhood If all above answers are "NO", may proceed with cephalosporin use.  . Poison Oak Extract Anaphylaxis    PHYSICAL EXAM:  Performance status (ECOG): 0 - Asymptomatic  Vitals:   05/23/20 1120  BP: 121/69  Pulse: 99  Resp: 20  Temp: (!) 96.9 F (36.1 C)  SpO2: 97%   Wt Readings from Last 3 Encounters:  05/23/20 166 lb 1.6 oz (75.3 kg)  01/23/20 160 lb 12.8 oz (72.9 kg)  11/23/19 162 lb (73.5 kg)   Physical Exam Vitals reviewed.  Constitutional:      Appearance: Normal appearance.  Cardiovascular:     Rate and Rhythm: Normal rate and regular rhythm.     Pulses: Normal pulses.     Heart sounds: Normal heart sounds.  Pulmonary:     Effort: Pulmonary effort is normal.     Breath sounds: Normal breath sounds.  Neurological:     General: No focal deficit present.     Mental Status: She is alert and oriented to person, place, and time.  Psychiatric:        Mood and Affect: Mood normal.        Behavior:  Behavior normal.      LABORATORY  DATA:  I have reviewed the labs as listed.  CBC Latest Ref Rng & Units 05/22/2020 06/07/2019 06/05/2019  WBC 4.0 - 10.5 K/uL 11.8(H) 12.9(H) 10.2  Hemoglobin 12.0 - 15.0 g/dL 15.2(H) 11.6(L) 11.5(L)  Hematocrit 36 - 46 % 47.8(H) 35.4(L) 34.3(L)  Platelets 150 - 400 K/uL 344 294 266   CMP Latest Ref Rng & Units 05/22/2020 10/24/2019 06/07/2019  Glucose 70 - 99 mg/dL 182(H) 204(H) -  BUN 6 - 20 mg/dL 15 12 -  Creatinine 0.44 - 1.00 mg/dL 0.63 0.67 0.50  Sodium 135 - 145 mmol/L 138 136 -  Potassium 3.5 - 5.1 mmol/L 3.8 3.8 -  Chloride 98 - 111 mmol/L 103 103 -  CO2 22 - 32 mmol/L 23 23 -  Calcium 8.9 - 10.3 mg/dL 9.2 9.7 -  Total Protein 6.5 - 8.1 g/dL 7.3 - -  Total Bilirubin 0.3 - 1.2 mg/dL 0.6 - -  Alkaline Phos 38 - 126 U/L 98 - -  AST 15 - 41 U/L 17 - -  ALT 0 - 44 U/L 31 - -   Lab Results  Component Value Date   LDH 120 05/22/2020   Lab Results  Component Value Date   VD25OH 12.13 (L) 05/22/2020    DIAGNOSTIC IMAGING:  I have independently reviewed the scans and discussed with the patient. CT CHEST W CONTRAST  Result Date: 05/22/2020 CLINICAL DATA:  History of left lung adenocarcinoma. Follow-up right lung nodule. EXAM: CT CHEST WITH CONTRAST TECHNIQUE: Multidetector CT imaging of the chest was performed during intravenous contrast administration. CONTRAST:  25mL OMNIPAQUE IOHEXOL 300 MG/ML  SOLN COMPARISON:  Chest CT 11/22/2019. FINDINGS: Cardiovascular: Normal heart size. Coronary arterial and thoracic aortic vascular calcifications. Stent graft material within the descending thoracic aorta. Mediastinum/Nodes: No enlarged axillary, mediastinal or hilar lymphadenopathy. Normal appearance of the esophagus. Lungs/Pleura: Central airways are patent. Prior left upper lobectomy. Stable appearance of the remaining left lung. Similar-appearing 2.0 x 1.2 cm mixed density lesion right lung apex (image 29; series 4), previously 2.1 x 1.2 cm. Slight interval decrease in size of associated  solid nodule (image 28; series 4). No pleural effusion or pneumothorax. Upper Abdomen: No acute process. Right upper renal pole cortical scarring. Musculoskeletal: Thoracic spine degenerative changes. No aggressive or acute appearing osseous lesions. IMPRESSION: 1. Similar-appearing mixed density lesion within the right lung apex with interval decrease in size of associated solid nodule. Recommend continued attention on follow-up as the possibility of low grade neoplasm is not excluded. 2. No evidence for metastatic disease in the chest. 3. Aortic atherosclerosis. Aortic Atherosclerosis (ICD10-I70.0). Electronically Signed   By: Lovey Newcomer M.D.   On: 05/22/2020 14:15     ASSESSMENT:  1.  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  She tried Chantix number medication she could not tolerate. -PET scan on 05/14/2019 shows right upper lobe nodule measuring 1.9 x 1.1 cm, SUV 1.0. -CT chest with contrast on 05/22/2020 shows similar-appearing mixed density lesion in the right lung apex measuring approximately 2 cm.  No evidence of metastatic disease in the chest.   PLAN:  1.  Stage Ia (T1 a N0) left upper lobe adenocarcinoma, bronchoalveolar type: -I have reviewed results of the CT scan of the chest with the patient in detail.  I have also counseled importance of smoking cessation. -As she  had very low uptake on the PET scan in August 2020, I will plan to repeat CT of the chest in 6 months.  Very slow-growing adenocarcinoma cannot be excluded.  If there is any further growth, will do CT surgery.  2 vitamin D deficiency: -Vitamin D today is 12.13.  We will start her on vitamin D 50,000 units weekly.  We will plan to check it in 6 months.    Orders placed this encounter:  No orders of the defined types were placed in this encounter.    Derek Jack, MD [place] [phone]   I, Milinda Antis, am acting as a scribe for Dr. Sanda Linger.  I, Derek Jack MD, have reviewed the above documentation for accuracy and completeness, and I agree with the above.

## 2020-05-26 ENCOUNTER — Encounter: Payer: Self-pay | Admitting: Internal Medicine

## 2020-05-26 ENCOUNTER — Telehealth: Payer: Self-pay | Admitting: Internal Medicine

## 2020-05-26 ENCOUNTER — Other Ambulatory Visit: Payer: Self-pay

## 2020-05-26 ENCOUNTER — Ambulatory Visit (INDEPENDENT_AMBULATORY_CARE_PROVIDER_SITE_OTHER): Payer: Medicare HMO | Admitting: Internal Medicine

## 2020-05-26 VITALS — BP 110/60 | HR 100 | Ht 65.0 in | Wt 168.0 lb

## 2020-05-26 DIAGNOSIS — E1169 Type 2 diabetes mellitus with other specified complication: Secondary | ICD-10-CM

## 2020-05-26 DIAGNOSIS — R69 Illness, unspecified: Secondary | ICD-10-CM | POA: Diagnosis not present

## 2020-05-26 LAB — POCT GLYCOSYLATED HEMOGLOBIN (HGB A1C): Hemoglobin A1C: 8.1 % — AB (ref 4.0–5.6)

## 2020-05-26 MED ORDER — GLUCOSE BLOOD VI STRP: ORAL_STRIP | 2 refills | Status: DC

## 2020-05-26 MED ORDER — NOVOLOG MIX 70/30 FLEXPEN (70-30) 100 UNIT/ML ~~LOC~~ SUPN: PEN_INJECTOR | SUBCUTANEOUS | 11 refills | Status: DC

## 2020-05-26 MED ORDER — METFORMIN HCL 500 MG PO TABS: 500.0000 mg | ORAL_TABLET | Freq: Two times a day (BID) | ORAL | 3 refills | Status: DC

## 2020-05-26 NOTE — Patient Instructions (Signed)
-   Metformin ONE tablet with Breakfast and ONE tablet with Supper - Novolog Mix 16 units with Breakfast and 26 units with Supper  - Jardiance 10 mg with breakfast      -HOW TO TREAT LOW BLOOD SUGARS (Blood sugar LESS THAN 70 MG/DL)  Please follow the RULE OF 15 for the treatment of hypoglycemia treatment (when your (blood sugars are less than 70 mg/dL)    STEP 1: Take 15 grams of carbohydrates when your blood sugar is low, which includes:   3-4 GLUCOSE TABS  OR  3-4 OZ OF JUICE OR REGULAR SODA OR  ONE TUBE OF GLUCOSE GEL     STEP 2: RECHECK blood sugar in 15 MINUTES STEP 3: If your blood sugar is still low at the 15 minute recheck --> then, go back to STEP 1 and treat AGAIN with another 15 grams of carbohydrates.

## 2020-05-26 NOTE — Telephone Encounter (Signed)
Rx sent 

## 2020-05-26 NOTE — Telephone Encounter (Signed)
Medication Refill Request  Did you call your pharmacy and request this refill first? Yes  . If patient has not contacted pharmacy first, instruct them to do so for future refills.  . Remind them that contacting the pharmacy for their refill is the quickest method to get the refill.  . Refill policy also stated that it will take anywhere between 24-72 hours to receive the refill.    Name of medication? Metformin and One Touch test strips  Is this a 90 day supply? yes  Name and location of pharmacy?  [phone] - [place], [address] Phone:  [phone]  Fax:  [phone]       . Is the request for diabetes test strips? yes . If yes, what brand? One touch

## 2020-05-26 NOTE — Progress Notes (Signed)
Name: Marissa [name]  Age/ Sex: 54 y.o., female   MRN/ DOB: [id number], 01-15-1966     PCP: Redmond School, MD   Reason for Endocrinology Evaluation: Type 2 Diabetes Mellitus  Initial Endocrine Consultative Visit: 06/04/2019    PATIENT IDENTIFIER: Marissa [name] is a 54 y.o. female with a past medical history of HTN,T2DM, Dyslipidemia and Lung Ca (2006) . The patient has followed with Endocrinology clinic since 06/03/2001 for consultative assistance with management of her diabetes.  DIABETIC HISTORY:  Marissa [name] was diagnosed with T2DM in 2006. She has been on oral glycemic agents in the past ( Glipizide, glimepiride, metformin, Farxiga ( not covered) , insulin added in 01/2019. Her hemoglobin A1c has ranged from 6.7% in 2015, peaking at 13.8% in 02/2019.   On her initial visit to our clinic her A1c was 10.8%  , she was on Glimepiride, Metformin and Levemir. We stopped Levemir, and Glimepiride , started insulin Mix and reduced metformin dose due to nausea and diarrhea.  Jardiance started 10/2019  SUBJECTIVE:   During the last visit (01/23/2020): A1c 7.3 %. We continued metformin,  insulin mix and  Jardiance    Today (05/26/2020): Marissa [name] is here for a 4 month follow up on diabetes management. She checks her blood sugars 2  times daily, preprandial to breakfast and supper. The patient has not had hypoglycemic episodes since the last clinic visit.    ROS: As per HPI and as detailed below: Review of Systems  Constitutional: Negative for fever.  Gastrointestinal: Negative for diarrhea and nausea.  Endo/Heme/Allergies: Positive for polydipsia.      HOME DIABETES REGIMEN:  Metformin 500 mg ,1 tablet BID  Novolog Mix 14 units with Breakfast and 22 units with Supper  Jardiance 10 mg daily      METER DOWNLOAD SUMMARY: 116- 235 mg/dL   DIABETIC COMPLICATIONS: Microvascular complications:   Neuropathy   Denies: CKD , retinopathy   Last eye exam: Completed  04/2018  Macrovascular complications:   CAD ( Pending PCI next week)   Denies:  PVD, CVA   HISTORY:  Past Medical History:  Past Medical History:  Diagnosis Date   Adenocarcinoma of lung ([place]) 02/06/2014   Anxiety    Arthritis    COPD (chronic obstructive pulmonary disease) ([place])    Coronary artery disease    Diabetes mellitus    GERD (gastroesophageal reflux disease)    Hypertension    Hypothyroidism    Lung cancer ([place])    Past Surgical History:  Past Surgical History:  Procedure Laterality Date   APPENDECTOMY     CHOLECYSTECTOMY     LOBECTOMY Left 2006   [place] IMG MI  05/2008   lexiscan -perfusion defect in anterior myocardium (breast attenuation); remaining myocardium with NO evidence of ischemia or infarct; EF 78%; low risk scan   OOPHORECTOMY Left    PARTIAL HYSTERECTOMY  1985   SALPINGOOPHORECTOMY Left    benign 9lb mass removed and a massive wall of smaller tumors removed   THORACIC AORTIC ENDOVASCULAR STENT GRAFT N/A 06/04/2019   Procedure: THORACIC AORTIC ENDOVASCULAR STENT GRAFT;  Surgeon: Angelia Mould, MD;  Location: [facility];  Service: Vascular;  Laterality: N/A;   TRANSTHORACIC ECHOCARDIOGRAM  05/2008   EF =/>55%, mild MR; trace TR    Social History:  reports that she has been smoking cigarettes. She has a 8.00 pack-year smoking history. She has never used smokeless tobacco. She reports that she does not drink alcohol and does  not use drugs. Family History:  Family History  Problem Relation Age of Onset   Colon cancer Mother    Lung cancer Mother    Heart disease Mother    Heart disease Father    Ovarian cancer Sister    Hepatitis Brother    Hernia Brother      HOME MEDICATIONS: Allergies as of 05/26/2020      Reactions   Bee Venom Anaphylaxis   Penicillins Swelling, Rash, Other (See Comments)   Did it involve swelling of the face/tongue/throat, SOB, or low BP? Yes-whole body (swelling) Did it involve sudden  or severe rash/hives, skin peeling, or any reaction on the inside of your mouth or nose? Yes-Hives Did you need to seek medical attention at a hospital or doctor's office? Yes When did it last happen?Childhood If all above answers are "NO", may proceed with cephalosporin use.   Poison Oak Extract Anaphylaxis      Medication List       Accurate as of May 26, 2020  2:57 PM. If you have any questions, ask your nurse or doctor.        STOP taking these medications   busPIRone 30 MG tablet Commonly known as: BUSPAR Stopped by: Dorita Sciara, MD   Doxepin HCl 6 MG Tabs Stopped by: Dorita Sciara, MD   fish oil-omega-3 fatty acids 1000 MG capsule Stopped by: Dorita Sciara, MD     TAKE these medications   albuterol-ipratropium 18-103 MCG/ACT inhaler Commonly known as: COMBIVENT Inhale 2 puffs into the lungs every 6 (six) hours as needed for wheezing or shortness of breath.   Combivent Respimat 20-100 MCG/ACT Aers respimat Generic drug: Ipratropium-Albuterol Inhale 2 puffs into the lungs 4 (four) times daily.   ALPRAZolam 0.5 MG tablet Commonly known as: XANAX Take 0.5 mg by mouth as needed. Take 0.5 mg at bedtime every night. Take 0.5mg  once a day as needed for anxiety.   apixaban 5 MG Tabs tablet Commonly known as: Eliquis TAKE 1 TABLET(5 MG) BY MOUTH TWICE DAILY   aspirin EC 81 MG tablet Take 81 mg by mouth daily.   atorvastatin 80 MG tablet Commonly known as: LIPITOR Take 1 tablet (80 mg total) by mouth daily at 6 PM.   EpiPen 2-Pak 0.3 mg/0.3 mL Soaj injection Generic drug: EPINEPHrine Inject 0.3 mg into the muscle as needed for anaphylaxis. Has available   ergocalciferol 1.25 MG (50000 UT) capsule Commonly known as: VITAMIN D2 Take 1 capsule (50,000 Units total) by mouth once a week.   esomeprazole 40 MG capsule Commonly known as: NEXIUM Take 1 capsule (40 mg total) by mouth daily before breakfast.   ezetimibe 10 MG  tablet Commonly known as: ZETIA Take 10 mg by mouth daily.   glucose blood test strip Use as instructed to test blood sugars 7 times daily E11.65   Insulin Pen Needle 32G X 4 MM Misc 2x daily   Jardiance 10 MG Tabs tablet Generic drug: empagliflozin Take 10 mg by mouth daily before breakfast.   levothyroxine 50 MCG tablet Commonly known as: SYNTHROID Take 1 tablet (50 mcg total) by mouth daily.   lisinopril 10 MG tablet Commonly known as: ZESTRIL Take 1 tablet (10 mg total) by mouth daily.   metFORMIN 500 MG tablet Commonly known as: Glucophage Take 1 tablet (500 mg total) by mouth 2 (two) times daily with a meal.   NovoLOG Mix 70/30 FlexPen (70-30) 100 UNIT/ML FlexPen Generic drug: insulin aspart protamine - aspart  Inject 0.16 mLs (16 Units total) into the skin daily with breakfast AND 0.26 mLs (26 Units total) daily with supper. What changed: See the new instructions. Changed by: Dorita Sciara, MD   oxyCODONE 15 MG immediate release tablet Commonly known as: ROXICODONE Take 15 mg by mouth every 4 (four) hours as needed for pain.   zolpidem 10 MG tablet Commonly known as: AMBIEN Take 10 mg by mouth at bedtime as needed.        OBJECTIVE:   Vital Signs: BP 110/60 (BP Location: Left Arm, Patient Position: Sitting, Cuff Size: Normal)    Pulse 100    Ht 5\' 5"  (1.651 m)    Wt 168 lb (76.2 kg)    SpO2 96%    BMI 27.96 kg/m   Wt Readings from Last 3 Encounters:  05/26/20 168 lb (76.2 kg)  05/23/20 166 lb 1.6 oz (75.3 kg)  01/23/20 160 lb 12.8 oz (72.9 kg)     Exam: General: Pt appears well and is in NAD  Lungs: Clear with good BS bilat with no rales, rhonchi, or wheezes  Heart: RRR with normal S1 and S2 and no gallops; no murmurs; no rub  Abdomen: Normoactive bowel sounds, soft, nontender, without masses or organomegaly palpable  Extremities: No pretibial edema.   Neuro: MS is good with appropriate affect, pt is alert and Ox3       DM foot exam:  05/26/2020  The skin of the feet is intact without sores or ulcerations. The pedal pulses are 2+ on right and 2+ on left. The sensation is decreased to a screening 5.07, 10 gram monofilament bilaterally   DATA REVIEWED:  Lab Results  Component Value Date   HGBA1C 8.1 (A) 05/26/2020   HGBA1C 7.3 (A) 01/23/2020   HGBA1C 8.2 (A) 10/24/2019   Results for Marissa [name], Marissa [name] (MRN [id number]) as of 01/23/2020 13:10  Ref. Range 10/24/2019 13:55  Sodium Latest Ref Range: 135 - 145 mEq/L 136  Potassium Latest Ref Range: 3.5 - 5.1 mEq/L 3.8  Chloride Latest Ref Range: 96 - 112 mEq/L 103  CO2 Latest Ref Range: 19 - 32 mEq/L 23  Glucose Latest Ref Range: 70 - 99 mg/dL 204 (H)  BUN Latest Ref Range: 6 - 23 mg/dL 12  Creatinine Latest Ref Range: 0.40 - 1.20 mg/dL 0.67  Calcium Latest Ref Range: 8.4 - 10.5 mg/dL 9.7    ASSESSMENT / PLAN / RECOMMENDATIONS:   1) Type 2 Diabetes Mellitus,Sub- Optimally  controlled, With Neuropathic and Macrovascular  complications - Most recent A1c of 8.1 %. Goal A1c < 7.0 %.   -  Slight worsening of hyperglycemia, pt noted with weight gain as well - I have encouraged her to continue to work on lifestyle changes  - Intolerant to higher doses of metformin  - Will adjust insulin as below    MEDICATIONS:  Continue Metformin 500 mg BID  Increase Novolog Mix 16 units with Breakfast and 26 units with Supper    Continue Jardiance 10 mg daily   EDUCATION / INSTRUCTIONS:  BG monitoring instructions: Patient is instructed to check her blood sugars 2 times a day, breakfast and supper.  Call [facility] if: BG persistently < 70   I reviewed the Rule of 15 for the treatment of hypoglycemia in detail with the patient. Literature supplied.     F/U in 4 months    Signed electronically by: Mack Guise, MD  [facility]  [facility] [address], [address], [place] [address] Phone: [phone] FAX:  [phone]   CC: [address], [place] [address] Phone: [phone]  Fax: [phone]  Return to Endocrinology clinic as below: Future Appointments  Date Time Provider [place]  05/27/2020  2:00 PM AP-MM DIAG AP-MM [place] [facility]  09/26/2020  1:40 PM Wynnie Pacetti, Melanie Crazier, MD [facility] None  11/26/2020  8:00 AM AP-ACAPA LAB AP-AC[facility] None  11/26/2020  9:00 AM AP-CT 1 AP-CT [place] [facility]  12/01/2020 11:45 AM Derek Jack, MD AP-ACAPA None

## 2020-05-27 ENCOUNTER — Ambulatory Visit (HOSPITAL_COMMUNITY): Payer: Medicare HMO

## 2020-06-02 DIAGNOSIS — Z794 Long term (current) use of insulin: Secondary | ICD-10-CM | POA: Diagnosis not present

## 2020-06-02 DIAGNOSIS — H2513 Age-related nuclear cataract, bilateral: Secondary | ICD-10-CM | POA: Diagnosis not present

## 2020-06-02 DIAGNOSIS — H401132 Primary open-angle glaucoma, bilateral, moderate stage: Secondary | ICD-10-CM | POA: Diagnosis not present

## 2020-06-02 DIAGNOSIS — E119 Type 2 diabetes mellitus without complications: Secondary | ICD-10-CM | POA: Diagnosis not present

## 2020-06-17 DIAGNOSIS — G894 Chronic pain syndrome: Secondary | ICD-10-CM | POA: Diagnosis not present

## 2020-06-17 DIAGNOSIS — Z6827 Body mass index (BMI) 27.0-27.9, adult: Secondary | ICD-10-CM | POA: Diagnosis not present

## 2020-06-17 DIAGNOSIS — E1140 Type 2 diabetes mellitus with diabetic neuropathy, unspecified: Secondary | ICD-10-CM | POA: Diagnosis not present

## 2020-06-17 DIAGNOSIS — R69 Illness, unspecified: Secondary | ICD-10-CM | POA: Diagnosis not present

## 2020-06-24 ENCOUNTER — Other Ambulatory Visit: Payer: Self-pay | Admitting: Internal Medicine

## 2020-06-24 DIAGNOSIS — R69 Illness, unspecified: Secondary | ICD-10-CM | POA: Diagnosis not present

## 2020-07-11 DIAGNOSIS — H401132 Primary open-angle glaucoma, bilateral, moderate stage: Secondary | ICD-10-CM | POA: Diagnosis not present

## 2020-07-13 ENCOUNTER — Other Ambulatory Visit: Payer: Self-pay | Admitting: Internal Medicine

## 2020-07-17 ENCOUNTER — Other Ambulatory Visit: Payer: Self-pay | Admitting: Cardiovascular Disease

## 2020-07-17 DIAGNOSIS — K219 Gastro-esophageal reflux disease without esophagitis: Secondary | ICD-10-CM | POA: Diagnosis not present

## 2020-07-17 DIAGNOSIS — G4700 Insomnia, unspecified: Secondary | ICD-10-CM | POA: Diagnosis not present

## 2020-07-17 DIAGNOSIS — I739 Peripheral vascular disease, unspecified: Secondary | ICD-10-CM

## 2020-07-17 DIAGNOSIS — G894 Chronic pain syndrome: Secondary | ICD-10-CM | POA: Diagnosis not present

## 2020-07-17 NOTE — Telephone Encounter (Signed)
Prescription refill request for Eliquis received.  Last office visit: Nishan, 10/15/2019 Scr: 0.63, 05/22/2020 Age: 54 y.o. Weight: 76.2 kg   Prescription refill sent.

## 2020-07-25 ENCOUNTER — Other Ambulatory Visit: Payer: Self-pay | Admitting: Internal Medicine

## 2020-07-25 DIAGNOSIS — R69 Illness, unspecified: Secondary | ICD-10-CM | POA: Diagnosis not present

## 2020-08-21 DIAGNOSIS — C3490 Malignant neoplasm of unspecified part of unspecified bronchus or lung: Secondary | ICD-10-CM | POA: Diagnosis not present

## 2020-08-21 DIAGNOSIS — E1140 Type 2 diabetes mellitus with diabetic neuropathy, unspecified: Secondary | ICD-10-CM | POA: Diagnosis not present

## 2020-08-21 DIAGNOSIS — G894 Chronic pain syndrome: Secondary | ICD-10-CM | POA: Diagnosis not present

## 2020-08-25 ENCOUNTER — Other Ambulatory Visit: Payer: Self-pay | Admitting: Internal Medicine

## 2020-08-25 DIAGNOSIS — R69 Illness, unspecified: Secondary | ICD-10-CM | POA: Diagnosis not present

## 2020-09-24 NOTE — Progress Notes (Signed)
Name: Marissa [name]  Age/ Sex: 54 y.o., female   MRN/ DOB: [id number], 29-Mar-1966     PCP: Redmond School, MD   Reason for Endocrinology Evaluation: Type 2 Diabetes Mellitus  Initial Endocrine Consultative Visit: 06/04/2019    PATIENT IDENTIFIER: Marissa [name] is a 54 y.o. female with a past medical history of HTN,T2DM, Dyslipidemia and Lung Ca (2006) . The patient has followed with Endocrinology clinic since 06/03/2001 for consultative assistance with management of her diabetes.  DIABETIC HISTORY:  Marissa [name] was diagnosed with T2DM in 2006. She has been on oral glycemic agents in the past ( Glipizide, glimepiride, metformin, Farxiga ( not covered) , insulin added in 01/2019. Her hemoglobin A1c has ranged from 6.7% in 2015, peaking at 13.8% in 02/2019.   On her initial visit to our clinic her A1c was 10.8%  , she was on Glimepiride, Metformin and Levemir. We stopped Levemir, and Glimepiride , started insulin Mix and reduced metformin dose due to nausea and diarrhea.  Jardiance started 10/2019  SUBJECTIVE:   During the last visit (05/26/2020): A1c 8.1 %. We continued metformin,  Jardiance  And increased insulin    Today (09/26/2020): Marissa [name] is here for a 4 month follow up on diabetes management. She checks her blood sugars 2 -3 times daily, preprandial to breakfast and supper. The patient has not had hypoglycemic episodes since the last clinic visit.     HOME DIABETES REGIMEN:  Metformin 500 mg ,1 tablet BID  Novolog Mix 16 units with Breakfast and 26 units with Supper  Jardiance 10 mg daily     METER DOWNLOAD SUMMARY: Forgot to bring  DIABETIC COMPLICATIONS: Microvascular complications:   Neuropathy , glaucoma   Denies: CKD , retinopathy   Last eye exam: Completed 2021  Macrovascular complications:   CAD ( S/P PCI )   Denies:  PVD, CVA   HISTORY:  Past Medical History:  Past Medical History:  Diagnosis Date  . Adenocarcinoma of lung ([place]) 02/06/2014   . Anxiety   . Arthritis   . COPD (chronic obstructive pulmonary disease) ([place])   . Coronary artery disease   . Diabetes mellitus   . GERD (gastroesophageal reflux disease)   . Hypertension   . Hypothyroidism   . Lung cancer [facility])    Past Surgical History:  Past Surgical History:  Procedure Laterality Date  . APPENDECTOMY    . CHOLECYSTECTOMY    . LOBECTOMY Left 2006  . [place] [facility] IM[facility] MI  05/2008   lexiscan -perfusion defect in anterior myocardium (breast attenuation); remaining myocardium with NO evidence of ischemia or infarct; EF 78%; low risk scan  . OOPHORECTOMY Left   . PARTIAL HYSTERECTOMY  1985  . SALPINGOOPHORECTOMY Left    benign 9lb mass removed and a massive wall of smaller tumors removed  . THORACIC AORTIC ENDOVASCULAR STENT GRAFT N/A 06/04/2019   Procedure: THORACIC AORTIC ENDOVASCULAR STENT GRAFT;  Surgeon: Angelia Mould, MD;  [facility];  Service: Vascular;  Laterality: N/A;  . TRANSTHORACIC ECHOCARDIOGRAM  05/2008   EF =/>55%, mild MR; trace TR    Social History:  reports that she has been smoking cigarettes. She has a 8.00 pack-year smoking history. She has never used smokeless tobacco. She reports that she does not drink alcohol and does not use drugs. Family History:  Family History  Problem Relation Age of Onset  . Colon cancer Mother   . Lung cancer Mother   . Heart disease Mother   .  Heart disease Father   . Ovarian cancer Sister   . Hepatitis Brother   . Hernia Brother      HOME MEDICATIONS: Allergies as of 09/26/2020      Reactions   Bee Venom Anaphylaxis   Penicillins Swelling, Rash, Other (See Comments)   Did it involve swelling of the face/tongue/throat, SOB, or low BP? Yes-whole body (swelling) Did it involve sudden or severe rash/hives, skin peeling, or any reaction on the inside of your mouth or nose? Yes-Hives Did you need to seek medical attention at a hospital or doctor's office? Yes When did it last  happen?Childhood If all above answers are "NO", may proceed with cephalosporin use.   Poison Oak Extract Anaphylaxis      Medication List       Accurate as of September 26, 2020  1:48 PM. If you have any questions, ask your nurse or doctor.        albuterol-ipratropium 18-103 MCG/ACT inhaler Commonly known as: COMBIVENT Inhale 2 puffs into the lungs every 6 (six) hours as needed for wheezing or shortness of breath.   Combivent Respimat 20-100 MCG/ACT Aers respimat Generic drug: Ipratropium-Albuterol Inhale 2 puffs into the lungs 4 (four) times daily.   ALPRAZolam 0.5 MG tablet Commonly known as: XANAX Take 0.5 mg by mouth as needed. Take 0.5 mg at bedtime every night. Take 0.5mg  once a day as needed for anxiety.   aspirin EC 81 MG tablet Take 81 mg by mouth daily.   atorvastatin 80 MG tablet Commonly known as: LIPITOR Take 1 tablet (80 mg total) by mouth daily at 6 PM.   BD Pen Needle Nano 2nd Gen 32G X 4 MM Misc Generic drug: Insulin Pen Needle USE TWICE DAILY   Eliquis 5 MG Tabs tablet Generic drug: apixaban TAKE 1 TABLET(5 MG) BY MOUTH TWICE DAILY   EpiPen 2-Pak 0.3 mg/0.3 mL Soaj injection Generic drug: EPINEPHrine Inject 0.3 mg into the muscle as needed for anaphylaxis. Has available   ergocalciferol 1.25 MG (50000 UT) capsule Commonly known as: VITAMIN D2 Take 1 capsule (50,000 Units total) by mouth once a week.   esomeprazole 40 MG capsule Commonly known as: NEXIUM Take 1 capsule (40 mg total) by mouth daily before breakfast.   ezetimibe 10 MG tablet Commonly known as: ZETIA Take 10 mg by mouth daily.   Jardiance 10 MG Tabs tablet Generic drug: empagliflozin TAKE 1 TABLET BY MOUTH DAILY BEFORE BREAKFAST   levothyroxine 50 MCG tablet Commonly known as: SYNTHROID Take 1 tablet (50 mcg total) by mouth daily.   lisinopril 10 MG tablet Commonly known as: ZESTRIL Take 1 tablet (10 mg total) by mouth daily.   metFORMIN 500 MG tablet Commonly  known as: Glucophage Take 1 tablet (500 mg total) by mouth 2 (two) times daily with a meal.   NovoLOG Mix 70/30 FlexPen (70-30) 100 UNIT/ML FlexPen Generic drug: insulin aspart protamine - aspart Inject 0.16 mLs (16 Units total) into the skin daily with breakfast AND 0.26 mLs (26 Units total) daily with supper.   OneTouch Ultra test strip Generic drug: glucose blood TEST 7 TIMES DAILY AS DIRECTED   oxyCODONE 15 MG immediate release tablet Commonly known as: ROXICODONE Take 15 mg by mouth every 4 (four) hours as needed for pain.   zolpidem 10 MG tablet Commonly known as: AMBIEN Take 10 mg by mouth at bedtime as needed.        OBJECTIVE:   Vital Signs: BP 124/80   Pulse 82  Ht 5\' 5"  (1.651 m)   Wt 169 lb 8 oz (76.9 kg)   SpO2 98%   BMI 28.21 kg/m   Wt Readings from Last 3 Encounters:  09/26/20 169 lb 8 oz (76.9 kg)  05/26/20 168 lb (76.2 kg)  05/23/20 166 lb 1.6 oz (75.3 kg)     Exam: General: Pt appears well and is in NAD  Lungs: Clear with good BS bilat with no rales, rhonchi, or wheezes  Heart: RRR with normal S1 and S2 and no gallops; no murmurs; no rub  Abdomen: Normoactive bowel sounds, soft, nontender  Extremities: No pretibial edema.   Neuro: MS is good with appropriate affect, pt is alert and Ox3       DM foot exam: 05/26/2020  The skin of the feet is intact without sores or ulcerations. The pedal pulses are 2+ on right and 2+ on left. The sensation is decreased to a screening 5.07, 10 gram monofilament bilaterally   DATA REVIEWED:  Lab Results  Component Value Date   HGBA1C 8.4 (A) 09/26/2020   HGBA1C 8.1 (A) 05/26/2020   HGBA1C 7.3 (A) 01/23/2020   Results for Marissa [name], Marissa [name] (MRN [id number]) as of 09/26/2020 13:45  Ref. Range 05/22/2020 08:48  COMPREHENSIVE METABOLIC PANEL Unknown Rpt (A)  Sodium Latest Ref Range: 135 - 145 mmol/L 138  Potassium Latest Ref Range: 3.5 - 5.1 mmol/L 3.8  Chloride Latest Ref Range: 98 - 111 mmol/L 103  CO2  Latest Ref Range: 22 - 32 mmol/L 23  Glucose Latest Ref Range: 70 - 99 mg/dL 182 (H)  BUN Latest Ref Range: 6 - 20 mg/dL 15  Creatinine Latest Ref Range: 0.44 - 1.00 mg/dL 0.63  Calcium Latest Ref Range: 8.9 - 10.3 mg/dL 9.2  Anion gap Latest Ref Range: 5 - 15  12  Alkaline Phosphatase Latest Ref Range: 38 - 126 U/L 98  Albumin Latest Ref Range: 3.5 - 5.0 g/dL 4.2  AST Latest Ref Range: 15 - 41 U/L 17  ALT Latest Ref Range: 0 - 44 U/L 31  Total Protein Latest Ref Range: 6.5 - 8.1 g/dL 7.3  Total Bilirubin Latest Ref Range: 0.3 - 1.2 mg/dL 0.6  GFR, Est Non African American Latest Ref Range: >60 mL/min >60  GFR, Est African American Latest Ref Range: >60 mL/min >60  LDH Latest Ref Range: 98 - 192 U/L 120  Vitamin D, 25-Hydroxy Latest Ref Range: 30 - 100 ng/mL 12.13 (L)  Vitamin B12 Latest Ref Range: 180 - 914 pg/mL 202     ASSESSMENT / PLAN / RECOMMENDATIONS:   1) Type 2 Diabetes Mellitus,Sub- Optimally  controlled, With Neuropathic and Macrovascular  complications - Most recent A1c of 8.4 %. Goal A1c < 7.0 %.   -  A1c stable  - Will increase insulin and Jardiance as below     MEDICATIONS:  Continue Metformin 500 mg BID  Increase Novolog Mix  20 units with Breakfast and 28 units with Supper   Increase Jardiance 25 mg daily   EDUCATION / INSTRUCTIONS:  BG monitoring instructions: Patient is instructed to check her blood sugars 2 times a day, breakfast and supper.  Call [facility] [place] [facility] clinic if: BG persistently < 70  . I reviewed the Rule of 15 for the treatment of hypoglycemia in detail with the patient. Literature supplied.     F/U in 4 months    Signed electronically by: Mack Guise, MD  [facility]  [facility] [address]  [place], [place] [address] Phone: [phone] FAX: [phone]   CC: [address], [place] [address] Phone: [phone]  Fax:  [phone]  Return to Endocrinology clinic as below: Future Appointments  Date Time Provider [place]  11/26/2020  8:00 AM AP-ACAPA LAB AP-ACAPA None  11/26/2020  9:00 AM AP-CT 1 AP-CT [place] [facility]  12/01/2020 11:45 AM Derek Jack, MD AP-ACAPA None

## 2020-09-26 ENCOUNTER — Other Ambulatory Visit: Payer: Self-pay

## 2020-09-26 ENCOUNTER — Ambulatory Visit (INDEPENDENT_AMBULATORY_CARE_PROVIDER_SITE_OTHER): Payer: Medicare HMO | Admitting: Internal Medicine

## 2020-09-26 ENCOUNTER — Encounter: Payer: Self-pay | Admitting: Internal Medicine

## 2020-09-26 VITALS — BP 124/80 | HR 82 | Ht 65.0 in | Wt 169.5 lb

## 2020-09-26 DIAGNOSIS — E1169 Type 2 diabetes mellitus with other specified complication: Secondary | ICD-10-CM

## 2020-09-26 DIAGNOSIS — E1165 Type 2 diabetes mellitus with hyperglycemia: Secondary | ICD-10-CM | POA: Diagnosis not present

## 2020-09-26 LAB — POCT GLYCOSYLATED HEMOGLOBIN (HGB A1C): Hemoglobin A1C: 8.4 % — AB (ref 4.0–5.6)

## 2020-09-26 LAB — GLUCOSE, POCT (MANUAL RESULT ENTRY): POC Glucose: 103 mg/dl — AB (ref 70–99)

## 2020-09-26 MED ORDER — BD PEN NEEDLE NANO 2ND GEN 32G X 4 MM MISC
1.0000 | Freq: Two times a day (BID) | 11 refills | Status: DC
Start: 2020-09-26 — End: 2022-02-01

## 2020-09-26 MED ORDER — NOVOLOG MIX 70/30 FLEXPEN (70-30) 100 UNIT/ML ~~LOC~~ SUPN
PEN_INJECTOR | SUBCUTANEOUS | 3 refills | Status: DC
Start: 2020-09-26 — End: 2021-02-17

## 2020-09-26 MED ORDER — METFORMIN HCL 500 MG PO TABS
500.0000 mg | ORAL_TABLET | Freq: Two times a day (BID) | ORAL | 3 refills | Status: DC
Start: 2020-09-26 — End: 2021-06-26

## 2020-09-26 MED ORDER — EMPAGLIFLOZIN 25 MG PO TABS: 25.0000 mg | ORAL_TABLET | Freq: Every day | ORAL | 3 refills | Status: DC

## 2020-09-26 NOTE — Patient Instructions (Addendum)
-   Metformin ONE tablet with Breakfast and ONE tablet with Supper - Novolog Mix 20 units with Breakfast and 28 units with Supper  - Increase Jardiance 25 mg with breakfast      -HOW TO TREAT LOW BLOOD SUGARS (Blood sugar LESS THAN 70 MG/DL)  Please follow the RULE OF 15 for the treatment of hypoglycemia treatment (when your (blood sugars are less than 70 mg/dL)    STEP 1: Take 15 grams of carbohydrates when your blood sugar is low, which includes:   3-4 GLUCOSE TABS  OR  3-4 OZ OF JUICE OR REGULAR SODA OR  ONE TUBE OF GLUCOSE GEL     STEP 2: RECHECK blood sugar in 15 MINUTES STEP 3: If your blood sugar is still low at the 15 minute recheck --> then, go back to STEP 1 and treat AGAIN with another 15 grams of carbohydrates.

## 2020-09-29 DIAGNOSIS — C3490 Malignant neoplasm of unspecified part of unspecified bronchus or lung: Secondary | ICD-10-CM | POA: Diagnosis not present

## 2020-09-29 DIAGNOSIS — I700 Atherosclerosis of aorta: Secondary | ICD-10-CM | POA: Diagnosis not present

## 2020-09-29 DIAGNOSIS — G894 Chronic pain syndrome: Secondary | ICD-10-CM | POA: Diagnosis not present

## 2020-09-29 DIAGNOSIS — E7849 Other hyperlipidemia: Secondary | ICD-10-CM | POA: Diagnosis not present

## 2020-09-29 DIAGNOSIS — E782 Mixed hyperlipidemia: Secondary | ICD-10-CM | POA: Diagnosis not present

## 2020-09-29 DIAGNOSIS — K219 Gastro-esophageal reflux disease without esophagitis: Secondary | ICD-10-CM | POA: Diagnosis not present

## 2020-09-29 DIAGNOSIS — Z6828 Body mass index (BMI) 28.0-28.9, adult: Secondary | ICD-10-CM | POA: Diagnosis not present

## 2020-09-29 DIAGNOSIS — R69 Illness, unspecified: Secondary | ICD-10-CM | POA: Diagnosis not present

## 2020-09-29 DIAGNOSIS — E538 Deficiency of other specified B group vitamins: Secondary | ICD-10-CM | POA: Diagnosis not present

## 2020-09-29 DIAGNOSIS — Z1389 Encounter for screening for other disorder: Secondary | ICD-10-CM | POA: Diagnosis not present

## 2020-09-29 DIAGNOSIS — Z0000 Encounter for general adult medical examination without abnormal findings: Secondary | ICD-10-CM | POA: Diagnosis not present

## 2020-09-29 DIAGNOSIS — N280 Ischemia and infarction of kidney: Secondary | ICD-10-CM | POA: Diagnosis not present

## 2020-09-29 DIAGNOSIS — I10 Essential (primary) hypertension: Secondary | ICD-10-CM | POA: Diagnosis not present

## 2020-09-29 DIAGNOSIS — E1165 Type 2 diabetes mellitus with hyperglycemia: Secondary | ICD-10-CM | POA: Diagnosis not present

## 2020-09-29 DIAGNOSIS — J45909 Unspecified asthma, uncomplicated: Secondary | ICD-10-CM | POA: Diagnosis not present

## 2020-09-29 DIAGNOSIS — E039 Hypothyroidism, unspecified: Secondary | ICD-10-CM | POA: Diagnosis not present

## 2020-09-29 DIAGNOSIS — E559 Vitamin D deficiency, unspecified: Secondary | ICD-10-CM | POA: Diagnosis not present

## 2020-09-30 ENCOUNTER — Other Ambulatory Visit (HOSPITAL_COMMUNITY): Payer: Self-pay | Admitting: Internal Medicine

## 2020-09-30 DIAGNOSIS — E2839 Other primary ovarian failure: Secondary | ICD-10-CM

## 2020-10-08 ENCOUNTER — Other Ambulatory Visit: Payer: Self-pay

## 2020-10-08 ENCOUNTER — Ambulatory Visit (HOSPITAL_COMMUNITY)
Admission: RE | Admit: 2020-10-08 | Discharge: 2020-10-08 | Disposition: A | Discharge status: Home / Self Care | Payer: Medicare HMO | Source: Ambulatory Visit | Attending: Internal Medicine | Admitting: Internal Medicine

## 2020-10-08 DIAGNOSIS — Z780 Asymptomatic menopausal state: Secondary | ICD-10-CM | POA: Diagnosis not present

## 2020-10-08 DIAGNOSIS — M8589 Other specified disorders of bone density and structure, multiple sites: Secondary | ICD-10-CM | POA: Diagnosis not present

## 2020-10-08 DIAGNOSIS — E2839 Other primary ovarian failure: Secondary | ICD-10-CM

## 2020-10-17 ENCOUNTER — Other Ambulatory Visit (HOSPITAL_COMMUNITY): Payer: Self-pay | Admitting: Hematology

## 2020-10-17 DIAGNOSIS — C3492 Malignant neoplasm of unspecified part of left bronchus or lung: Secondary | ICD-10-CM

## 2020-10-30 DIAGNOSIS — E669 Obesity, unspecified: Secondary | ICD-10-CM | POA: Diagnosis not present

## 2020-10-30 DIAGNOSIS — R69 Illness, unspecified: Secondary | ICD-10-CM | POA: Diagnosis not present

## 2020-10-30 DIAGNOSIS — G894 Chronic pain syndrome: Secondary | ICD-10-CM | POA: Diagnosis not present

## 2020-10-30 DIAGNOSIS — J449 Chronic obstructive pulmonary disease, unspecified: Secondary | ICD-10-CM | POA: Diagnosis not present

## 2020-11-25 ENCOUNTER — Encounter (HOSPITAL_COMMUNITY): Payer: Self-pay | Admitting: Radiology

## 2020-11-26 ENCOUNTER — Inpatient Hospital Stay (HOSPITAL_COMMUNITY): Payer: Medicare HMO | Attending: Hematology

## 2020-11-26 ENCOUNTER — Ambulatory Visit (HOSPITAL_COMMUNITY)
Admission: RE | Admit: 2020-11-26 | Discharge: 2020-11-26 | Disposition: A | Discharge status: Home / Self Care | Payer: Medicare HMO | Source: Ambulatory Visit | Attending: Hematology | Admitting: Hematology

## 2020-11-26 ENCOUNTER — Other Ambulatory Visit: Payer: Self-pay

## 2020-11-26 DIAGNOSIS — Z9079 Acquired absence of other genital organ(s): Secondary | ICD-10-CM | POA: Diagnosis not present

## 2020-11-26 DIAGNOSIS — E559 Vitamin D deficiency, unspecified: Secondary | ICD-10-CM | POA: Diagnosis not present

## 2020-11-26 DIAGNOSIS — Z902 Acquired absence of lung [part of]: Secondary | ICD-10-CM | POA: Insufficient documentation

## 2020-11-26 DIAGNOSIS — Z8041 Family history of malignant neoplasm of ovary: Secondary | ICD-10-CM | POA: Insufficient documentation

## 2020-11-26 DIAGNOSIS — Z85118 Personal history of other malignant neoplasm of bronchus and lung: Secondary | ICD-10-CM | POA: Diagnosis not present

## 2020-11-26 DIAGNOSIS — I2510 Atherosclerotic heart disease of native coronary artery without angina pectoris: Secondary | ICD-10-CM | POA: Diagnosis not present

## 2020-11-26 DIAGNOSIS — Z90710 Acquired absence of both cervix and uterus: Secondary | ICD-10-CM | POA: Diagnosis not present

## 2020-11-26 DIAGNOSIS — Z90722 Acquired absence of ovaries, bilateral: Secondary | ICD-10-CM | POA: Insufficient documentation

## 2020-11-26 DIAGNOSIS — C3492 Malignant neoplasm of unspecified part of left bronchus or lung: Secondary | ICD-10-CM

## 2020-11-26 DIAGNOSIS — Z801 Family history of malignant neoplasm of trachea, bronchus and lung: Secondary | ICD-10-CM | POA: Diagnosis not present

## 2020-11-26 DIAGNOSIS — R918 Other nonspecific abnormal finding of lung field: Secondary | ICD-10-CM | POA: Diagnosis not present

## 2020-11-26 DIAGNOSIS — J432 Centrilobular emphysema: Secondary | ICD-10-CM | POA: Diagnosis not present

## 2020-11-26 DIAGNOSIS — Z800 Family history of malignant neoplasm of digestive organs: Secondary | ICD-10-CM | POA: Insufficient documentation

## 2020-11-26 DIAGNOSIS — C3490 Malignant neoplasm of unspecified part of unspecified bronchus or lung: Secondary | ICD-10-CM | POA: Diagnosis not present

## 2020-11-26 DIAGNOSIS — J984 Other disorders of lung: Secondary | ICD-10-CM | POA: Diagnosis not present

## 2020-11-26 DIAGNOSIS — F17210 Nicotine dependence, cigarettes, uncomplicated: Secondary | ICD-10-CM | POA: Diagnosis not present

## 2020-11-26 DIAGNOSIS — R69 Illness, unspecified: Secondary | ICD-10-CM | POA: Diagnosis not present

## 2020-11-26 LAB — CBC WITH DIFFERENTIAL/PLATELET
Abs Immature Granulocytes: 0.06 10*3/uL (ref 0.00–0.07)
Basophils Absolute: 0.1 10*3/uL (ref 0.0–0.1)
Basophils Relative: 1 %
Eosinophils Absolute: 0.3 10*3/uL (ref 0.0–0.5)
Eosinophils Relative: 3 %
HCT: 49.2 % — ABNORMAL HIGH (ref 36.0–46.0)
Hemoglobin: 15.7 g/dL — ABNORMAL HIGH (ref 12.0–15.0)
Immature Granulocytes: 0 %
Lymphocytes Relative: 22 %
Lymphs Abs: 3 10*3/uL (ref 0.7–4.0)
MCH: 29.8 pg (ref 26.0–34.0)
MCHC: 31.9 g/dL (ref 30.0–36.0)
MCV: 93.4 fL (ref 80.0–100.0)
Monocytes Absolute: 1.2 10*3/uL — ABNORMAL HIGH (ref 0.1–1.0)
Monocytes Relative: 9 %
Neutro Abs: 8.7 10*3/uL — ABNORMAL HIGH (ref 1.7–7.7)
Neutrophils Relative %: 65 %
Platelets: 381 10*3/uL (ref 150–400)
RBC: 5.27 MIL/uL — ABNORMAL HIGH (ref 3.87–5.11)
RDW: 13.9 % (ref 11.5–15.5)
WBC: 13.4 10*3/uL — ABNORMAL HIGH (ref 4.0–10.5)
nRBC: 0 % (ref 0.0–0.2)

## 2020-11-26 LAB — COMPREHENSIVE METABOLIC PANEL
ALT: 17 U/L (ref 0–44)
AST: 15 U/L (ref 15–41)
Albumin: 4 g/dL (ref 3.5–5.0)
Alkaline Phosphatase: 106 U/L (ref 38–126)
Anion gap: 10 (ref 5–15)
BUN: 11 mg/dL (ref 6–20)
CO2: 23 mmol/L (ref 22–32)
Calcium: 9.4 mg/dL (ref 8.9–10.3)
Chloride: 103 mmol/L (ref 98–111)
Creatinine, Ser: 0.63 mg/dL (ref 0.44–1.00)
GFR, Estimated: >60 mL/min (ref >60)
Glucose, Bld: 159 mg/dL — ABNORMAL HIGH (ref 70–99)
Potassium: 3.2 mmol/L — ABNORMAL LOW (ref 3.5–5.1)
Sodium: 136 mmol/L (ref 135–145)
Total Bilirubin: 0.6 mg/dL (ref 0.3–1.2)
Total Protein: 7.4 g/dL (ref 6.5–8.1)

## 2020-11-26 LAB — MAGNESIUM: Magnesium: 1.8 mg/dL (ref 1.7–2.4)

## 2020-11-26 LAB — LACTATE DEHYDROGENASE: LDH: 122 U/L (ref 98–192)

## 2020-11-26 LAB — VITAMIN D 25 HYDROXY (VIT D DEFICIENCY, FRACTURES): Vit D, 25-Hydroxy: 107.28 ng/mL — ABNORMAL HIGH (ref 30–100)

## 2020-11-26 MED ORDER — IOHEXOL 300 MG/ML  SOLN
75.0000 mL | Freq: Once | INTRAMUSCULAR | Status: AC | PRN
  Administered 2020-11-26: 75 mL via INTRAVENOUS

## 2020-11-27 NOTE — Progress Notes (Signed)
[place], [place] [address]   CLINIC:  Medical Oncology/Hematology  PCP:  [facility], [place] / [place] [address] [phone]   REASON FOR VISIT:  Follow-up for adenocarcinoma of left lung  PRIOR THERAPY: Left upper lobectomy on 03/24/2015  NGS Results: Not done  CURRENT THERAPY: Surveillance  BRIEF ONCOLOGIC HISTORY:  Oncology History  Adenocarcinoma of lung ([place])  03/23/2005 Initial Diagnosis   Adenocarcinoma of lung with left upper lobe wedge resection by Dr. Arlyce Dice measuring 1.4 cm.  0/4 lymph nodes.   01/27/2015 Imaging   CT chest- 1. Stable examination demonstrating multiple tiny 2-3 mm pulmonary nodules in the right upper lobe which are likely benign areas of mucoid impaction in terminal bronchioles, in addition to an unchanged 6 x 5 mm ground-glass attenuation nodule in the apex of the right upper lobe. Continued attention on annual followup examinations is recommended to ensure continued stability. 2. Status post left upper lobectomy. No findings to suggest local recurrence of disease. 3. Atherosclerosis, including three-vessel coronary artery disease. Please note that although the presence of coronary artery calcium documents the presence of coronary artery disease, the severity of this disease and any potential stenosis cannot be assessed on this non-gated CT examination. Assessment for potential risk factor modification, dietary therapy or pharmacologic therapy may be warranted, if clinically indicated.   04/19/2017 Imaging   CT chest- 1. No evidence lung cancer recurrence in the LEFT lower lung. 2. Increase in the ground-glass surrounding a RIGHT upper lobe pulmonary nodule. Minimal size increased over 3 year interval. Recommend follow-up CT in 12 months for part solid nodule. This recommendation follows the consensus statement: Guidelines for Management of Incidental Pulmonary Nodules  Detected on CT Images: From the Fleischner Society 2017; Radiology 2017; 284:228-243. These results will be called to the ordering clinician or representative by the Radiologist Assistant, and communication documented in the PACS or zVision Dashboard.     CANCER STAGING: Cancer Staging Adenocarcinoma of lung ([place]) Staging form: Lung, AJCC 7th Edition - Clinical: Stage IA (T1a, N0, M0) - Signed by Baird Cancer, PA-C on 02/06/2014   INTERVAL HISTORY:  Ms. Marissa MONTELONGO, a 55 y.o. female, returns for routine follow-up of her adenocarcinoma of left lung. Marissa [name] was last seen on 05/23/2020.   Today she reports feeling  REVIEW OF SYSTEMS:  Review of Systems - Oncology  PAST MEDICAL/SURGICAL HISTORY:  Past Medical History:  Diagnosis Date  . Adenocarcinoma of lung ([place]) 02/06/2014  . Anxiety   . Arthritis   . COPD (chronic obstructive pulmonary disease) ([place])   . Coronary artery disease   . Diabetes mellitus   . GERD (gastroesophageal reflux disease)   . Hypertension   . Hypothyroidism   . Lung cancer [facility])    Past Surgical History:  Procedure Laterality Date  . APPENDECTOMY    . CHOLECYSTECTOMY    . LOBECTOMY Left 2006  . NM MYOCAR IMG MI  05/2008   lexiscan -perfusion defect in anterior myocardium (breast attenuation); remaining myocardium with NO evidence of ischemia or infarct; EF 78%; low risk scan  . OOPHORECTOMY Left   . PARTIAL HYSTERECTOMY  1985  . SALPINGOOPHORECTOMY Left    benign 9lb mass removed and a massive wall of smaller tumors removed  . THORACIC AORTIC ENDOVASCULAR STENT GRAFT N/A 06/04/2019   Procedure: THORACIC AORTIC ENDOVASCULAR STENT GRAFT;  Surgeon: Angelia Mould, MD;  Location: [facility];  Service: Vascular;  Laterality: N/A;  .  TRANSTHORACIC ECHOCARDIOGRAM  05/2008   EF =/>55%, mild MR; trace TR    SOCIAL HISTORY:  Social History   Socioeconomic History  . Marital status: Married    Spouse name: Not on file  . Number of children:  Not on file  . Years of education: Not on file  . Highest education level: Not on file  Occupational History  . Not on file  Tobacco Use  . Smoking status: Current Every Day Smoker    Packs/day: 0.25    Years: 32.00    Pack years: 8.00    Types: Cigarettes  . Smokeless tobacco: Never Used  Vaping Use  . Vaping Use: Never used  Substance and Sexual Activity  . Alcohol use: No  . Drug use: No  . Sexual activity: Not on file  Other Topics Concern  . Not on file  Social History Narrative  . Not on file   Social Determinants of Health   Financial Resource Strain: Not on file  Food Insecurity: Not on file  Transportation Needs: Not on file  Physical Activity: Not on file  Stress: Not on file  Social Connections: Not on file  Intimate Partner Violence: Not on file    FAMILY HISTORY:  Family History  Problem Relation Age of Onset  . Colon cancer Mother   . Lung cancer Mother   . Heart disease Mother   . Heart disease Father   . Ovarian cancer Sister   . Hepatitis Brother   . Hernia Brother     CURRENT MEDICATIONS:  Current Outpatient Medications  Medication Sig Dispense Refill  . Vitamin D, Ergocalciferol, (DRISDOL) 1.25 MG (50000 UNIT) CAPS capsule TAKE 1 CAPSULE BY MOUTH 1 TIME A WEEK 12 capsule 2  . albuterol-ipratropium (COMBIVENT) 18-103 MCG/ACT inhaler Inhale 2 puffs into the lungs every 6 (six) hours as needed for wheezing or shortness of breath. 1 Inhaler 1  . ALPRAZolam (XANAX) 0.5 MG tablet Take 0.5 mg by mouth as needed. Take 0.5 mg at bedtime every night. Take 0.5mg  once a day as needed for anxiety.    Marland Kitchen aspirin EC 81 MG tablet Take 81 mg by mouth daily.    Marland Kitchen atorvastatin (LIPITOR) 80 MG tablet Take 1 tablet (80 mg total) by mouth daily at 6 PM. 30 tablet 6  . COMBIVENT RESPIMAT 20-100 MCG/ACT AERS respimat Inhale 2 puffs into the lungs 4 (four) times daily.    Marland Kitchen ELIQUIS 5 MG TABS tablet TAKE 1 TABLET(5 MG) BY MOUTH TWICE DAILY 60 tablet 5  . empagliflozin  (JARDIANCE) 25 MG TABS tablet Take 1 tablet (25 mg total) by mouth daily before breakfast. 90 tablet 3  . EPIPEN 2-PAK 0.3 MG/0.3ML SOAJ injection Inject 0.3 mg into the muscle as needed for anaphylaxis. Has available  1  . esomeprazole (NEXIUM) 40 MG capsule Take 1 capsule (40 mg total) by mouth daily before breakfast. 30 capsule 3  . ezetimibe (ZETIA) 10 MG tablet Take 10 mg by mouth daily.    . insulin aspart protamine - aspart (NOVOLOG MIX 70/30 FLEXPEN) (70-30) 100 UNIT/ML FlexPen Inject 0.2 mLs (20 Units total) into the skin daily with breakfast AND 0.28 mLs (28 Units total) daily with supper. 45 mL 3  . Insulin Pen Needle (BD PEN NEEDLE NANO 2ND GEN) 32G X 4 MM MISC Inject 1 Device into the skin 2 (two) times daily. 100 each 11  . levothyroxine (SYNTHROID) 50 MCG tablet Take 1 tablet (50 mcg total) by mouth daily. [place]  tablet 3  . lisinopril (ZESTRIL) 10 MG tablet Take 1 tablet (10 mg total) by mouth daily. 30 tablet 5  . metFORMIN (GLUCOPHAGE) 500 MG tablet Take 1 tablet (500 mg total) by mouth 2 (two) times daily with a meal. 180 tablet 3  . ONETOUCH ULTRA test strip TEST 7 TIMES DAILY AS DIRECTED 500 strip 2  . oxyCODONE (ROXICODONE) 15 MG immediate release tablet Take 15 mg by mouth every 4 (four) hours as needed for pain.     Marland Kitchen zolpidem (AMBIEN) 10 MG tablet Take 10 mg by mouth at bedtime as needed.     No current facility-administered medications for this visit.    ALLERGIES:  Allergies  Allergen Reactions  . Bee Venom Anaphylaxis  . Penicillins Swelling, Rash and Other (See Comments)    Did it involve swelling of the face/tongue/throat, SOB, or low BP? Yes-whole body (swelling) Did it involve sudden or severe rash/hives, skin peeling, or any reaction on the inside of your mouth or nose? Yes-Hives Did you need to seek medical attention at a hospital or doctor's office? Yes When did it last happen?Childhood If all above answers are "NO", may proceed with cephalosporin use.   . Poison Oak Extract Anaphylaxis    PHYSICAL EXAM:  Performance status (ECOG): 0 - Asymptomatic  There were no vitals filed for this visit. Wt Readings from Last 3 Encounters:  09/26/20 169 lb 8 oz (76.9 kg)  05/26/20 168 lb (76.2 kg)  05/23/20 166 lb 1.6 oz (75.3 kg)   Physical Exam   LABORATORY DATA:  I have reviewed the labs as listed.  CBC Latest Ref Rng & Units 11/26/2020 05/22/2020 06/07/2019  WBC 4.0 - 10.5 K/uL 13.4(H) 11.8(H) 12.9(H)  Hemoglobin 12.0 - 15.0 g/dL 15.7(H) 15.2(H) 11.6(L)  Hematocrit 36.0 - 46.0 % 49.2(H) 47.8(H) 35.4(L)  Platelets 150 - 400 K/uL 381 344 294   CMP Latest Ref Rng & Units 11/26/2020 05/22/2020 10/24/2019  Glucose 70 - 99 mg/dL 159(H) 182(H) 204(H)  BUN 6 - 20 mg/dL 11 15 12   Creatinine 0.44 - 1.00 mg/dL 0.63 0.63 0.67  Sodium 135 - 145 mmol/L 136 138 136  Potassium 3.5 - 5.1 mmol/L 3.2(L) 3.8 3.8  Chloride 98 - 111 mmol/L 103 103 103  CO2 22 - 32 mmol/L 23 23 23   Calcium 8.9 - 10.3 mg/dL 9.4 9.2 9.7  Total Protein 6.5 - 8.1 g/dL 7.4 7.3 -  Total Bilirubin 0.3 - 1.2 mg/dL 0.6 0.6 -  Alkaline Phos 38 - 126 U/L 106 98 -  AST 15 - 41 U/L 15 17 -  ALT 0 - 44 U/L 17 31 -   Lab Results  Component Value Date   LDH 122 11/26/2020   LDH 120 05/22/2020   Lab Results  Component Value Date   VD25OH 107.28 (H) 11/26/2020   VD25OH 12.13 (L) 05/22/2020    DIAGNOSTIC IMAGING:  I have independently reviewed the scans and discussed with the patient. CT Chest W Contrast  Result Date: 11/26/2020 CLINICAL DATA:  Lung cancer.  Right lung nodule. EXAM: CT CHEST WITH CONTRAST TECHNIQUE: Multidetector CT imaging of the chest was performed during intravenous contrast administration. CONTRAST:  65mL OMNIPAQUE IOHEXOL 300 MG/ML  SOLN COMPARISON:  05/22/2020, 04/20/2018 and 02/06/2015. FINDINGS: Cardiovascular: Atherosclerotic calcification of the aorta and coronary arteries. Endovascular stent graft is seen in descending thoracic aorta. Heart size normal. No  pericardial effusion. Mediastinum/Nodes: No pathologically enlarged mediastinal, hilar or axillary lymph nodes. Esophagus is grossly unremarkable. Lungs/Pleura: A  mixed solid and ground-glass nodule in the apical segment right upper lobe measures 1.5 x 2.4 cm (4/24), similar to slightly increased in size from 05/22/2020, at which time it measured approximately 1.2 x 2.0 cm. The internal solid component is roughly stable at 6 mm. When compared with exams dating back to 02/06/2015, there has been indolent growth, with the lesion measuring only 5 x 6 mm on 02/06/2015. Mild centrilobular emphysema. Left upper lobectomy. Pleuroparenchymal scarring at the base of the left hemithorax. Lungs are otherwise clear. No pleural fluid. Airway is otherwise unremarkable. Upper Abdomen: Visualized portions of the liver and adrenal glands are unremarkable. Scarring in the kidneys bilaterally. Visualized portions of the spleen pancreas, stomach and bowel are unremarkable. Cholecystectomy. No upper abdominal adenopathy. Musculoskeletal: Degenerative changes in the spine. No worrisome lytic or sclerotic lesions. IMPRESSION: 1. Mixed solid and ground-glass nodule in the apical segment right upper lobe shows indolent growth from 02/06/2015 and is highly worrisome for adenocarcinoma. 2. Aortic atherosclerosis (ICD10-I70.0). Coronary artery calcification. 3.  Emphysema (ICD10-J43.9). Electronically Signed   By: Lorin Picket M.D.   On: 11/26/2020 15:12     ASSESSMENT:  1. 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  She tried Chantix number medication she could not tolerate. -PET scan on 05/14/2019 shows right upper lobe nodule measuring 1.9 x 1.1 cm, SUV 1.0. -CT chest with contrast on 05/22/2020 shows similar-appearing mixed density lesion in the right lung apex measuring approximately 2 cm.  No  evidence of metastatic disease in the chest.   PLAN:  1. Stage Ia (T1 a N0) left upper lobe adenocarcinoma, bronchoalveolar type: -We have reviewed CT chest with contrast from 11/26/2020.  Mixed solid groundglass nodule in the right upper lobe measures 1.5 x 2.4 cm, previously 1.2 x 2 cm in August 2021.  It measured 5 x 6 mm in 2016.  No lymphadenopathy was seen. -The slow growth is worrisome for adenocarcinoma, bronchoalveolar type. -I have talked to her about possible surgical resection.  She would like to wait for the next few months. -Recommend PET CT scan and pulmonary function tests in the next 4 to 6 weeks.  At that point I will likely make referral to CT surgery.  2.  Vitamin D deficiency: -She is taking vitamin D 50,000 units weekly. -Her vitamin D level is 107. -Recommend discontinuing weekly vitamin D.  Start vitamin D 2000 units daily.   Orders placed this encounter:  No orders of the defined types were placed in this encounter.    Derek Jack, MD [place] [phone]   I, Milinda Antis, am acting as a scribe for Dr. Sanda Linger.  I, Derek Jack MD, have reviewed the above documentation for accuracy and completeness, and I agree with the above.

## 2020-12-01 ENCOUNTER — Other Ambulatory Visit: Payer: Self-pay

## 2020-12-01 ENCOUNTER — Inpatient Hospital Stay (HOSPITAL_BASED_OUTPATIENT_CLINIC_OR_DEPARTMENT_OTHER): Payer: Medicare HMO | Admitting: Hematology

## 2020-12-01 VITALS — BP 130/70 | HR 110 | Temp 97.2°F | Resp 19 | Wt 169.4 lb

## 2020-12-01 DIAGNOSIS — E559 Vitamin D deficiency, unspecified: Secondary | ICD-10-CM | POA: Diagnosis not present

## 2020-12-01 DIAGNOSIS — Z90722 Acquired absence of ovaries, bilateral: Secondary | ICD-10-CM | POA: Diagnosis not present

## 2020-12-01 DIAGNOSIS — C3492 Malignant neoplasm of unspecified part of left bronchus or lung: Secondary | ICD-10-CM

## 2020-12-01 DIAGNOSIS — R918 Other nonspecific abnormal finding of lung field: Secondary | ICD-10-CM | POA: Diagnosis not present

## 2020-12-01 DIAGNOSIS — C3490 Malignant neoplasm of unspecified part of unspecified bronchus or lung: Secondary | ICD-10-CM | POA: Diagnosis not present

## 2020-12-01 DIAGNOSIS — Z85118 Personal history of other malignant neoplasm of bronchus and lung: Secondary | ICD-10-CM | POA: Diagnosis not present

## 2020-12-01 DIAGNOSIS — Z800 Family history of malignant neoplasm of digestive organs: Secondary | ICD-10-CM | POA: Diagnosis not present

## 2020-12-01 DIAGNOSIS — Z9079 Acquired absence of other genital organ(s): Secondary | ICD-10-CM | POA: Diagnosis not present

## 2020-12-01 DIAGNOSIS — Z90710 Acquired absence of both cervix and uterus: Secondary | ICD-10-CM | POA: Diagnosis not present

## 2020-12-01 DIAGNOSIS — Z801 Family history of malignant neoplasm of trachea, bronchus and lung: Secondary | ICD-10-CM | POA: Diagnosis not present

## 2020-12-01 DIAGNOSIS — R69 Illness, unspecified: Secondary | ICD-10-CM | POA: Diagnosis not present

## 2020-12-01 DIAGNOSIS — Z902 Acquired absence of lung [part of]: Secondary | ICD-10-CM | POA: Diagnosis not present

## 2020-12-01 DIAGNOSIS — G894 Chronic pain syndrome: Secondary | ICD-10-CM | POA: Diagnosis not present

## 2020-12-01 NOTE — Patient Instructions (Signed)
[place] at [facility] Discharge Instructions  You were seen and examined today by Dr. Delton Coombes. Start taking over the counter Vitamin D 2000 once daily. Please keep follow up as scheduled.   Thank you for choosing [place] at [facility] to provide your oncology and hematology care.  To afford each patient quality time with our provider, please arrive at least 15 minutes before your scheduled appointment time.   If you have a lab appointment with the [place] [facility] please come in thru the Main Entrance and check in at the main information desk.  You need to re-schedule your appointment should you arrive 10 or more minutes late.  We strive to give you quality time with our providers, and arriving late affects you and other patients whose appointments are after yours.  Also, if you no show three or more times for appointments you may be dismissed from the clinic at the providers discretion.     Again, thank you for choosing [facility].  Our hope is that these requests will decrease the amount of time that you wait before being seen by our physicians.       _____________________________________________________________  Should you have questions after your visit to [facility], please contact our office at [phone] and follow the prompts.  Our office hours are 8:00 a.m. and 4:30 p.m. Monday - Friday.  Please note that voicemails left after 4:00 p.m. may not be returned until the following business day.  We are closed weekends and major holidays.  You do have access to a nurse 24-7, just call the main number to the clinic [phone] and do not press any options, hold on the line and a nurse will answer the phone.    For prescription refill requests, have your pharmacy contact our office and allow 72 hours.    Due to Covid, you will need to wear a mask upon entering the hospital. If you do not have a mask, a mask will  be given to you at the Main Entrance upon arrival. For doctor visits, patients may have 1 support person age 19 or older with them. For treatment visits, patients can not have anyone with them due to social distancing guidelines and our immunocompromised population.

## 2020-12-12 ENCOUNTER — Other Ambulatory Visit: Payer: Self-pay

## 2020-12-12 ENCOUNTER — Other Ambulatory Visit (HOSPITAL_COMMUNITY)
Admission: RE | Admit: 2020-12-12 | Discharge: 2020-12-12 | Disposition: A | Discharge status: Home / Self Care | Payer: Medicare HMO | Source: Ambulatory Visit | Attending: Hematology | Admitting: Hematology

## 2020-12-12 DIAGNOSIS — Z01812 Encounter for preprocedural laboratory examination: Secondary | ICD-10-CM | POA: Insufficient documentation

## 2020-12-12 DIAGNOSIS — Z20822 Contact with and (suspected) exposure to covid-19: Secondary | ICD-10-CM | POA: Insufficient documentation

## 2020-12-13 LAB — SARS CORONAVIRUS 2 (TAT 6-24 HRS): SARS Coronavirus 2: NEGATIVE

## 2020-12-16 ENCOUNTER — Other Ambulatory Visit: Payer: Self-pay

## 2020-12-16 ENCOUNTER — Ambulatory Visit (HOSPITAL_COMMUNITY)
Admission: RE | Admit: 2020-12-16 | Discharge: 2020-12-16 | Disposition: A | Discharge status: Home / Self Care | Payer: Medicare HMO | Source: Ambulatory Visit | Attending: Hematology | Admitting: Hematology

## 2020-12-16 DIAGNOSIS — F17210 Nicotine dependence, cigarettes, uncomplicated: Secondary | ICD-10-CM | POA: Diagnosis not present

## 2020-12-16 DIAGNOSIS — J449 Chronic obstructive pulmonary disease, unspecified: Secondary | ICD-10-CM | POA: Insufficient documentation

## 2020-12-16 DIAGNOSIS — C3492 Malignant neoplasm of unspecified part of left bronchus or lung: Secondary | ICD-10-CM | POA: Insufficient documentation

## 2020-12-16 DIAGNOSIS — R69 Illness, unspecified: Secondary | ICD-10-CM | POA: Diagnosis not present

## 2020-12-16 LAB — PULMONARY FUNCTION TEST
DL/VA % pred: 81 %
DL/VA: 3.42 ml/min/mmHg/L
DLCO cor % pred: 63 %
DLCO cor: 13.55 ml/min/mmHg
DLCO unc % pred: 67 %
DLCO unc: 14.42 ml/min/mmHg
FEF 25-75 Post: 1.49 L/sec
FEF 25-75 Pre: 1.2 L/sec
FEF2575-%Change-Post: 24 %
FEF2575-%Pred-Post: 56 %
FEF2575-%Pred-Pre: 45 %
FEV1-%Change-Post: 6 %
FEV1-%Pred-Post: 69 %
FEV1-%Pred-Pre: 64 %
FEV1-Post: 1.93 L
FEV1-Pre: 1.8 L
FEV1FVC-%Change-Post: -3 %
FEV1FVC-%Pred-Pre: 87 %
FEV6-%Change-Post: 9 %
FEV6-%Pred-Post: 82 %
FEV6-%Pred-Pre: 75 %
FEV6-Post: 2.84 L
FEV6-Pre: 2.6 L
FEV6FVC-%Change-Post: -1 %
FEV6FVC-%Pred-Post: 101 %
FEV6FVC-%Pred-Pre: 103 %
FVC-%Change-Post: 11 %
FVC-%Pred-Post: 81 %
FVC-%Pred-Pre: 73 %
FVC-Post: 2.89 L
FVC-Pre: 2.6 L
Post FEV1/FVC ratio: 67 %
Post FEV6/FVC ratio: 98 %
Pre FEV1/FVC ratio: 69 %
Pre FEV6/FVC Ratio: 100 %
RV % pred: 134 %
RV: 2.61 L
TLC % pred: 102 %
TLC: 5.33 L

## 2020-12-16 MED ORDER — ALBUTEROL SULFATE (2.5 MG/3ML) 0.083% IN NEBU
2.5000 mg | INHALATION_SOLUTION | Freq: Once | RESPIRATORY_TRACT | Status: AC
  Administered 2020-12-16: 2.5 mg via RESPIRATORY_TRACT

## 2020-12-22 ENCOUNTER — Other Ambulatory Visit: Payer: Self-pay

## 2020-12-22 ENCOUNTER — Encounter (HOSPITAL_COMMUNITY)
Admission: RE | Admit: 2020-12-22 | Discharge: 2020-12-22 | Disposition: A | Discharge status: Home / Self Care | Payer: Medicare HMO | Source: Ambulatory Visit | Attending: Hematology | Admitting: Hematology

## 2020-12-22 DIAGNOSIS — C3490 Malignant neoplasm of unspecified part of unspecified bronchus or lung: Secondary | ICD-10-CM | POA: Diagnosis not present

## 2020-12-22 DIAGNOSIS — C3492 Malignant neoplasm of unspecified part of left bronchus or lung: Secondary | ICD-10-CM

## 2020-12-22 MED ORDER — FLUDEOXYGLUCOSE F - 18 (FDG) INJECTION
9.4500 | Freq: Once | INTRAVENOUS | Status: AC | PRN
  Administered 2020-12-22: 9.45 via INTRAVENOUS

## 2020-12-23 ENCOUNTER — Telehealth (HOSPITAL_COMMUNITY): Payer: Self-pay | Admitting: Surgery

## 2020-12-23 NOTE — Telephone Encounter (Signed)
[facility] Radiology called to make Dr. Delton Coombes aware of the pt's PET scan results.  Dr. Delton Coombes notified.

## 2020-12-29 DIAGNOSIS — J449 Chronic obstructive pulmonary disease, unspecified: Secondary | ICD-10-CM | POA: Diagnosis not present

## 2020-12-29 DIAGNOSIS — E1140 Type 2 diabetes mellitus with diabetic neuropathy, unspecified: Secondary | ICD-10-CM | POA: Diagnosis not present

## 2020-12-29 DIAGNOSIS — J45909 Unspecified asthma, uncomplicated: Secondary | ICD-10-CM | POA: Diagnosis not present

## 2020-12-29 DIAGNOSIS — E1142 Type 2 diabetes mellitus with diabetic polyneuropathy: Secondary | ICD-10-CM | POA: Diagnosis not present

## 2020-12-29 DIAGNOSIS — Z6828 Body mass index (BMI) 28.0-28.9, adult: Secondary | ICD-10-CM | POA: Diagnosis not present

## 2020-12-29 DIAGNOSIS — R69 Illness, unspecified: Secondary | ICD-10-CM | POA: Diagnosis not present

## 2020-12-29 DIAGNOSIS — Z794 Long term (current) use of insulin: Secondary | ICD-10-CM | POA: Diagnosis not present

## 2020-12-29 DIAGNOSIS — E1165 Type 2 diabetes mellitus with hyperglycemia: Secondary | ICD-10-CM | POA: Diagnosis not present

## 2020-12-29 DIAGNOSIS — G894 Chronic pain syndrome: Secondary | ICD-10-CM | POA: Diagnosis not present

## 2020-12-31 ENCOUNTER — Other Ambulatory Visit (HOSPITAL_COMMUNITY): Payer: Self-pay | Admitting: Internal Medicine

## 2020-12-31 ENCOUNTER — Other Ambulatory Visit: Payer: Self-pay

## 2020-12-31 ENCOUNTER — Inpatient Hospital Stay (HOSPITAL_COMMUNITY): Payer: Medicare HMO | Attending: Hematology | Admitting: Hematology

## 2020-12-31 VITALS — BP 116/72 | HR 90 | Temp 97.1°F | Resp 18 | Wt 171.0 lb

## 2020-12-31 DIAGNOSIS — Z85118 Personal history of other malignant neoplasm of bronchus and lung: Secondary | ICD-10-CM | POA: Diagnosis not present

## 2020-12-31 DIAGNOSIS — F17210 Nicotine dependence, cigarettes, uncomplicated: Secondary | ICD-10-CM | POA: Insufficient documentation

## 2020-12-31 DIAGNOSIS — Z1231 Encounter for screening mammogram for malignant neoplasm of breast: Secondary | ICD-10-CM

## 2020-12-31 DIAGNOSIS — E559 Vitamin D deficiency, unspecified: Secondary | ICD-10-CM | POA: Insufficient documentation

## 2020-12-31 DIAGNOSIS — C3492 Malignant neoplasm of unspecified part of left bronchus or lung: Secondary | ICD-10-CM | POA: Diagnosis not present

## 2020-12-31 DIAGNOSIS — E039 Hypothyroidism, unspecified: Secondary | ICD-10-CM | POA: Diagnosis not present

## 2020-12-31 DIAGNOSIS — R69 Illness, unspecified: Secondary | ICD-10-CM | POA: Diagnosis not present

## 2020-12-31 NOTE — Patient Instructions (Signed)
Marissa [name] at [facility] Discharge Instructions  You were seen today by Dr. Delton Coombes. He went over your recent results and scans. You will be referred to Dr. Roxan Hockey to have your right upper lobe lung mass removed. Dr. Delton Coombes will see you back 4 weeks after the surgery for follow up.   Thank you for choosing [place] at [facility] to provide your oncology and hematology care.  To afford each patient quality time with our provider, please arrive at least 15 minutes before your scheduled appointment time.   If you have a lab appointment with the [place] please come in thru the Main Entrance and check in at the main information desk  You need to re-schedule your appointment should you arrive 10 or more minutes late.  We strive to give you quality time with our providers, and arriving late affects you and other patients whose appointments are after yours.  Also, if you no show three or more times for appointments you may be dismissed from the clinic at the providers discretion.     Again, thank you for choosing [facility].  Our hope is that these requests will decrease the amount of time that you wait before being seen by our physicians.       _____________________________________________________________  Should you have questions after your visit to [facility], please contact our office at [phone] between the hours of 8:00 a.m. and 4:30 p.m.  Voicemails left after 4:00 p.m. will not be returned until the following business day.  For prescription refill requests, have your pharmacy contact our office and allow 72 hours.    Cancer Center Support Programs:   > Cancer Support Group  2nd Tuesday of the month 1pm-2pm, Journey Room

## 2020-12-31 NOTE — Progress Notes (Signed)
Marissa [name], [place] [address]   CLINIC:  Medical Oncology/Hematology  PCP:  [facility], [place] / [place] [address] [phone]   REASON FOR VISIT:  Follow-up for adenocarcinoma of left upper lobe of lung  PRIOR THERAPY: Left upper lobectomy on 03/24/2015  NGS Results: Not done  CURRENT THERAPY: Surveillance  BRIEF ONCOLOGIC HISTORY:  Oncology History  Adenocarcinoma of lung ([place])  03/23/2005 Initial Diagnosis   Adenocarcinoma of lung with left upper lobe wedge resection by Dr. Arlyce Dice measuring 1.4 cm.  0/4 lymph nodes.   01/27/2015 Imaging   CT chest- 1. Stable examination demonstrating multiple tiny 2-3 mm pulmonary nodules in the right upper lobe which are likely benign areas of mucoid impaction in terminal bronchioles, in addition to an unchanged 6 x 5 mm ground-glass attenuation nodule in the apex of the right upper lobe. Continued attention on annual followup examinations is recommended to ensure continued stability. 2. Status post left upper lobectomy. No findings to suggest local recurrence of disease. 3. Atherosclerosis, including three-vessel coronary artery disease. Please note that although the presence of coronary artery calcium documents the presence of coronary artery disease, the severity of this disease and any potential stenosis cannot be assessed on this non-gated CT examination. Assessment for potential risk factor modification, dietary therapy or pharmacologic therapy may be warranted, if clinically indicated.   04/19/2017 Imaging   CT chest- 1. No evidence lung cancer recurrence in the LEFT lower lung. 2. Increase in the ground-glass surrounding a RIGHT upper lobe pulmonary nodule. Minimal size increased over 3 year interval. Recommend follow-up CT in 12 months for part solid nodule. This recommendation follows the consensus statement: Guidelines for Management of Incidental Pulmonary  Nodules Detected on CT Images: From the Fleischner Society 2017; Radiology 2017; 284:228-243. These results will be called to the ordering clinician or representative by the Radiologist Assistant, and communication documented in the PACS or zVision Dashboard.     CANCER STAGING: Cancer Staging Adenocarcinoma of lung ([place]) Staging form: Lung, AJCC 7th Edition - Clinical: Stage IA (T1a, N0, M0) - Signed by Baird Cancer, PA-C on 02/06/2014   INTERVAL HISTORY:  Marissa [name], a 55 y.o. female, returns for routine follow-up of her adenocarcinoma of left upper lobe of lung. Gianah was last seen on 12/01/2020.   Today she reports feeling okay. She continues having a cough which is occasionally productive with clear sputum but denies SOB or CP; she was able to walk from [place] to [place] in [place]. She is taking vitamin D daily. She continues having a boil in her right vaginal wall and went to a dermatologist for possible treatment.   REVIEW OF SYSTEMS:  Review of Systems  Constitutional: Positive for fatigue (50%). Negative for appetite change.  Respiratory: Positive for cough (w/ occasional clear sputum). Negative for shortness of breath.   Cardiovascular: Negative for chest pain.  All other systems reviewed and are negative.   PAST MEDICAL/SURGICAL HISTORY:  Past Medical History:  Diagnosis Date  . Adenocarcinoma of lung ([place]) 02/06/2014  . Anxiety   . Arthritis   . COPD (chronic obstructive pulmonary disease) ([place])   . Coronary artery disease   . Diabetes mellitus   . GERD (gastroesophageal reflux disease)   . Hypertension   . Hypothyroidism   . Lung cancer [facility])    Past Surgical History:  Procedure Laterality Date  . APPENDECTOMY    . CHOLECYSTECTOMY    .  LOBECTOMY Left 2006  . NM MYOCAR IMG MI  05/2008   lexiscan -perfusion defect in anterior myocardium (breast attenuation); remaining myocardium with NO evidence of ischemia or infarct; EF 78%; low risk  scan  . OOPHORECTOMY Left   . PARTIAL HYSTERECTOMY  1985  . SALPINGOOPHORECTOMY Left    benign 9lb mass removed and a massive wall of smaller tumors removed  . THORACIC AORTIC ENDOVASCULAR STENT GRAFT N/A 06/04/2019   Procedure: THORACIC AORTIC ENDOVASCULAR STENT GRAFT;  Surgeon: Angelia Mould, MD;  Location: [facility] OR;  Service: Vascular;  Laterality: N/A;  . TRANSTHORACIC ECHOCARDIOGRAM  05/2008   EF =/>55%, mild MR; trace TR    SOCIAL HISTORY:  Social History   Socioeconomic History  . Marital status: Married    Spouse name: Not on file  . Number of children: Not on file  . Years of education: Not on file  . Highest education level: Not on file  Occupational History  . Not on file  Tobacco Use  . Smoking status: Current Every Day Smoker    Packs/day: 0.25    Years: 32.00    Pack years: 8.00    Types: Cigarettes  . Smokeless tobacco: Never Used  Vaping Use  . Vaping Use: Never used  Substance and Sexual Activity  . Alcohol use: No  . Drug use: No  . Sexual activity: Not on file  Other Topics Concern  . Not on file  Social History Narrative  . Not on file   Social Determinants of Health   Financial Resource Strain: Not on file  Food Insecurity: Not on file  Transportation Needs: No Transportation Needs  . Lack of Transportation (Medical): No  . Lack of Transportation (Non-Medical): No  Physical Activity: Not on file  Stress: Not on file  Social Connections: Not on file  Intimate Partner Violence: Not on file    FAMILY HISTORY:  Family History  Problem Relation Age of Onset  . Colon cancer Mother   . Lung cancer Mother   . Heart disease Mother   . Heart disease Father   . Ovarian cancer Sister   . Hepatitis Brother   . Hernia Brother     CURRENT MEDICATIONS:  Current Outpatient Medications  Medication Sig Dispense Refill  . albuterol (PROVENTIL) (2.5 MG/3ML) 0.083% nebulizer solution SMARTSIG:1 Nebule Via Nebulizer Every 6 Hours PRN    .  albuterol-ipratropium (COMBIVENT) 18-103 MCG/ACT inhaler Inhale 2 puffs into the lungs every 6 (six) hours as needed for wheezing or shortness of breath. 1 Inhaler 1  . ALPRAZolam (XANAX) 0.5 MG tablet Take 0.5 mg by mouth as needed. Take 0.5 mg at bedtime every night. Take 0.5mg  once a day as needed for anxiety.    Marland Kitchen aspirin EC 81 MG tablet Take 81 mg by mouth daily.    Marland Kitchen atorvastatin (LIPITOR) 80 MG tablet Take 1 tablet (80 mg total) by mouth daily at 6 PM. 30 tablet 6  . COMBIVENT RESPIMAT 20-100 MCG/ACT AERS respimat Inhale 2 puffs into the lungs 4 (four) times daily.    . Doxepin HCl 6 MG TABS TAKE 1 TABLET BY MOUTH 30 MINUTES BEFORE BEDTIME ON AN EMPTY STOMACH    . ELIQUIS 5 MG TABS tablet TAKE 1 TABLET(5 MG) BY MOUTH TWICE DAILY 60 tablet 5  . empagliflozin (JARDIANCE) 25 MG TABS tablet Take 1 tablet (25 mg total) by mouth daily before breakfast. 90 tablet 3  . EPIPEN 2-PAK 0.3 MG/0.3ML SOAJ injection Inject 0.3 mg into  the muscle as needed for anaphylaxis. Has available  1  . esomeprazole (NEXIUM) 40 MG capsule Take 1 capsule (40 mg total) by mouth daily before breakfast. 30 capsule 3  . ezetimibe (ZETIA) 10 MG tablet Take 10 mg by mouth daily.    . insulin aspart protamine - aspart (NOVOLOG MIX 70/30 FLEXPEN) (70-30) 100 UNIT/ML FlexPen Inject 0.2 mLs (20 Units total) into the skin daily with breakfast AND 0.28 mLs (28 Units total) daily with supper. 45 mL 3  . Insulin Pen Needle (BD PEN NEEDLE NANO 2ND GEN) 32G X 4 MM MISC Inject 1 Device into the skin 2 (two) times daily. 100 each 11  . levothyroxine (SYNTHROID) 50 MCG tablet Take 1 tablet (50 mcg total) by mouth daily. 30 tablet 3  . lisinopril (ZESTRIL) 10 MG tablet Take 1 tablet (10 mg total) by mouth daily. 30 tablet 5  . metFORMIN (GLUCOPHAGE) 500 MG tablet Take 1 tablet (500 mg total) by mouth 2 (two) times daily with a meal. 180 tablet 3  . ONETOUCH ULTRA test strip TEST 7 TIMES DAILY AS DIRECTED 500 strip 2  . oxyCODONE  (ROXICODONE) 15 MG immediate release tablet Take 15 mg by mouth every 4 (four) hours as needed for pain.     Marland Kitchen SHINGRIX injection     . zolpidem (AMBIEN) 10 MG tablet Take 10 mg by mouth at bedtime as needed.     No current facility-administered medications for this visit.    ALLERGIES:  Allergies  Allergen Reactions  . Bee Venom Anaphylaxis  . Penicillins Swelling, Rash and Other (See Comments)    Did it involve swelling of the face/tongue/throat, SOB, or low BP? Yes-whole body (swelling) Did it involve sudden or severe rash/hives, skin peeling, or any reaction on the inside of your mouth or nose? Yes-Hives Did you need to seek medical attention at a hospital or doctor's office? Yes When did it last happen?Childhood If all above answers are "NO", may proceed with cephalosporin use.  . Poison Oak Extract Anaphylaxis    PHYSICAL EXAM:  Performance status (ECOG): 0 - Asymptomatic  Vitals:   12/31/20 1030  BP: 116/72  Pulse: 90  Resp: 18  Temp: (!) 97.1 F (36.2 C)  SpO2: 98%   Wt Readings from Last 3 Encounters:  12/31/20 171 lb (77.6 kg)  12/01/20 169 lb 6.4 oz (76.8 kg)  09/26/20 169 lb 8 oz (76.9 kg)   Physical Exam Vitals reviewed.  Constitutional:      Appearance: Normal appearance.  Cardiovascular:     Rate and Rhythm: Normal rate and regular rhythm.     Pulses: Normal pulses.     Heart sounds: Normal heart sounds.  Pulmonary:     Effort: Pulmonary effort is normal.     Breath sounds: Normal breath sounds.  Neurological:     General: No focal deficit present.     Mental Status: She is alert and oriented to person, place, and time.  Psychiatric:        Mood and Affect: Mood normal.        Behavior: Behavior normal.      LABORATORY DATA:  I have reviewed the labs as listed.  CBC Latest Ref Rng & Units 11/26/2020 05/22/2020 06/07/2019  WBC 4.0 - 10.5 K/uL 13.4(H) 11.8(H) 12.9(H)  Hemoglobin 12.0 - 15.0 g/dL 15.7(H) 15.2(H) 11.6(L)  Hematocrit 36.0 -  46.0 % 49.2(H) 47.8(H) 35.4(L)  Platelets 150 - 400 K/uL 381 344 294   CMP Latest Ref  Rng & Units 11/26/2020 05/22/2020 10/24/2019  Glucose 70 - 99 mg/dL 159(H) 182(H) 204(H)  BUN 6 - 20 mg/dL 11 15 12   Creatinine 0.44 - 1.00 mg/dL 0.63 0.63 0.67  Sodium 135 - 145 mmol/L 136 138 136  Potassium 3.5 - 5.1 mmol/L 3.2(L) 3.8 3.8  Chloride 98 - 111 mmol/L 103 103 103  CO2 22 - 32 mmol/L 23 23 23   Calcium 8.9 - 10.3 mg/dL 9.4 9.2 9.7  Total Protein 6.5 - 8.1 g/dL 7.4 7.3 -  Total Bilirubin 0.3 - 1.2 mg/dL 0.6 0.6 -  Alkaline Phos 38 - 126 U/L 106 98 -  AST 15 - 41 U/L 15 17 -  ALT 0 - 44 U/L 17 31 -    DIAGNOSTIC IMAGING:  I have independently reviewed the scans and discussed with the patient. NM PET Image Restag (PS) Skull Base To Thigh  Result Date: 12/23/2020 CLINICAL DATA:  Subsequent treatment strategy for lung cancer in a 55 year old female post partial lung resection. Enlarging part solid lesion in the RIGHT upper lobe. EXAM: NUCLEAR MEDICINE PET SKULL BASE TO THIGH TECHNIQUE: 9.45 mCi F-18 FDG was injected intravenously. Full-ring PET imaging was performed from the skull base to thigh after the radiotracer. CT data was obtained and used for attenuation correction and anatomic localization. Fasting blood glucose: 141 mg/dl COMPARISON:  November 26, 2020 FINDINGS: Mediastinal blood pool activity: SUV max 3.32 Liver activity: SUV max NA NECK: No hypermetabolic lymph nodes in the neck. Incidental CT findings: none CHEST: Part solid nodule in the RIGHT upper lobe is unchanged compared to recent CT imaging and shows a maximum SUV of 1.4. Maximum size in the range of 2 cm. Internal solid component with similar appearance. Incidental CT findings: Signs of aortic endovascular repair. Calcified atheromatous plaque in the thoracic aorta. Three-vessel coronary artery disease. Normal heart size. No pericardial effusion. Limited assessment of cardiovascular structures given lack of intravenous contrast.  Signs of pulmonary emphysema. Evidence of prior partial lung resection, LEFT upper lobectomy. Esophagus grossly normal by CT. ABDOMEN/PELVIS: No abnormal hypermetabolic activity within the liver, pancreas, adrenal glands, or spleen. No hypermetabolic lymph nodes in the abdomen or pelvis. Incidental CT findings: Post cholecystectomy. Liver, pancreas, spleen, adrenal glands and kidneys without acute process. Marked RIGHT renal cortical scarring. No hydronephrosis. No acute gastrointestinal process. Colonic diverticulosis. Aortic atherosclerosis in the abdominal aorta without aneurysm. Uterus and adnexa unremarkable by CT. Area of increased FDG uptake with a maximum SUV of 7.4 measuring 1.8 by 1.1 cm on image 314 of series 3 along the lower LEFT labia and medial thigh. Mildly enlarged LEFT inguinal lymph node on image 288 of series 3 at 1 cm with a maximum SUV of 3.2. SKELETON: No focal hypermetabolic activity to suggest skeletal metastasis. Incidental CT findings: Spinal degenerative changes. IMPRESSION: Area of concern in the RIGHT upper lobe shows no changes, morphologic features remain concerning for bronchogenic neoplasm. Nonspecific low level uptake less than mediastinal blood pool. FDG uptake could be seen in the setting of but are nonspecific. Area of intense FDG uptake, focal and associated with either skin thickening or subcutaneous lesion about the LEFT labia and inter thigh. Findings are suspicious for small cutaneous or subcutaneous neoplasm. It is possible that urinary contamination could lead to spurious finding but given the focal thickening that is measurable on the current study in this location would suggest correlation with direct clinical inspection. Mildly prominent LEFT inguinal lymph node may be reactive, would be more suspicious if the finding  in the LEFT labia or upper thigh described above is found to represent neoplasm. Uptake is near mediastinal blood pool. These results will be called to  the ordering clinician or representative by the Radiologist Assistant, and communication documented in the PACS or [employer]. Electronically Signed   By: Zetta Bills M.D.   On: 12/23/2020 09:13     ASSESSMENT:  1. 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 She tried Chantix number medication she could not tolerate. -PET scan on 05/14/2019 shows right upper lobe nodule measuring 1.9 x 1.1 cm, SUV 1.0. -CT chest with contrast on 05/22/2020 shows similar-appearing mixed density lesion in the right lung apex measuring approximately 2 cm. No evidence of metastatic disease in the chest. -CT chest with contrast on 11/26/2020 showed mixed solid groundglass nodule in the right upper lobe measuring 1.5 x 2.4 cm, previously 1.2 x 2 cm in August 2021.  It measured 5 x 6 mm in 2016 with no lymphadenopathy. -PET scan on 12/22/2020 showed part solid right upper lobe nodule with SUV 1.4.  Left labial increased FDG uptake along with mildly prominent left inguinal lymph node.  Patient reports having boil in the left labial region.   PLAN:  1. Stage Ia (T1 a N0) left upper lobe adenocarcinoma, bronchoalveolar type: -Based on CT chest from 11/26/2020 which showed increase in size of the right upper lobe BX of the lesion I have recommended the PET scan. -We reviewed images of the PET scan from 12/22/2020 which showed right upper lobe nodule with SUV of 1.4.  No other evidence of lymphadenopathy in the chest. -This nodule has been gradually growing and suspicious for low-grade bronchoalveolar carcinoma. -I have recommended consultation with Dr. Roxan Hockey for possible bronchoscopy/resection. -We have also done full PFTs. -Return to clinic in 2 to 3 months.  2.  Vitamin D deficiency: -Continue vitamin D 2000 units daily.   Orders placed this encounter:  No orders of the  defined types were placed in this encounter.    Derek Jack, MD [place] [phone]   I, Milinda Antis, am acting as a scribe for Dr. Sanda Linger.  I, Derek Jack MD, have reviewed the above documentation for accuracy and completeness, and I agree with the above.

## 2021-01-01 ENCOUNTER — Ambulatory Visit (HOSPITAL_COMMUNITY)
Admission: RE | Admit: 2021-01-01 | Discharge: 2021-01-01 | Disposition: A | Discharge status: Home / Self Care | Payer: Medicare HMO | Source: Ambulatory Visit | Attending: Internal Medicine | Admitting: Internal Medicine

## 2021-01-01 ENCOUNTER — Inpatient Hospital Stay (HOSPITAL_COMMUNITY): Admission: RE | Admit: 2021-01-01 | Payer: Medicare HMO | Source: Ambulatory Visit

## 2021-01-01 DIAGNOSIS — Z1231 Encounter for screening mammogram for malignant neoplasm of breast: Secondary | ICD-10-CM | POA: Insufficient documentation

## 2021-01-07 ENCOUNTER — Other Ambulatory Visit: Payer: Self-pay

## 2021-01-07 ENCOUNTER — Encounter: Payer: Self-pay | Admitting: Thoracic Surgery (Cardiothoracic Vascular Surgery)

## 2021-01-07 ENCOUNTER — Institutional Professional Consult (permissible substitution): Payer: Medicare HMO | Admitting: Thoracic Surgery (Cardiothoracic Vascular Surgery)

## 2021-01-07 VITALS — BP 145/78 | HR 100 | Resp 20 | Ht 65.0 in

## 2021-01-07 DIAGNOSIS — C3490 Malignant neoplasm of unspecified part of unspecified bronchus or lung: Secondary | ICD-10-CM | POA: Diagnosis not present

## 2021-01-07 NOTE — Progress Notes (Signed)
PCP is Redmond School, MD Referring Provider is Derek Jack, MD  Chief Complaint  Patient presents with  . Lung Cancer    Initial surgical consult, CT chest 2/16, PET 3/14, PFTs 3/8    HPI: Marissa [name] is sent for consultation regarding a mixed density nodule in the right upper lobe.  Marissa [name] is a 55 year old woman with a history of tobacco abuse, COPD, stage Ia adenocarcinoma of the lung status post left upper lobectomy in 2006, type 2 insulin-dependent diabetes, hypertension, coronary disease, thoracic aortic atherosclerosis, thoracic aortic stent graft, arthritis, hypothyroidism, reflux, and anxiety.  She has smoked about a pack to a pack and a half a day for 32 years.  She has been followed with CT of the chest.  Dating back to about 2016 she has had a nodule in the right upper lobe.  She recently had a follow-up CT which showed mixed density lesion measuring 1.5 x 2.4 cm possibly slightly increased from August 2021 but definitely increased over 5 years.  She had a PET/CT which showed minimal activity.  She continues to smoke.  She says she is tried multiple methods for quitting including patches and Chantix.  She had adverse reaction to Chantix.  She has occasional burning chest pain that comes from her back and radiates around the ribs on both sides is relieved by eating.  This is not associated with exertion.  She does not have any exertional anginal symptoms.  She denies any change in appetite or weight loss.  She has not had any unusual headaches or visual changes.  She says that she can walk without any shortness of breath.  Zubrod Score: At the time of surgery this patient's most appropriate activity status/level should be described as: []     0    Normal activity, no symptoms [x]     1    Restricted in physical strenuous activity but ambulatory, able to do out light work []     2    Ambulatory and capable of self care, unable to do work activities, up and about >50 % of  waking hours                              []     3    Only limited self care, in bed greater than 50% of waking hours []     4    Completely disabled, no self care, confined to bed or chair []     5    Moribund  Past Medical History:  Diagnosis Date  . Adenocarcinoma of lung ([place]) 02/06/2014  . Anxiety   . Arthritis   . COPD (chronic obstructive pulmonary disease) ([place])   . Coronary artery disease   . Diabetes mellitus   . GERD (gastroesophageal reflux disease)   . Hypertension   . Hypothyroidism   . Lung cancer [facility])     Past Surgical History:  Procedure Laterality Date  . APPENDECTOMY    . CHOLECYSTECTOMY    . LOBECTOMY Left 2006  . NM MYOCAR IMG MI  05/2008   lexiscan -perfusion defect in anterior myocardium (breast attenuation); remaining myocardium with NO evidence of ischemia or infarct; EF 78%; low risk scan  . OOPHORECTOMY Left   . PARTIAL HYSTERECTOMY  1985  . SALPINGOOPHORECTOMY Left    benign 9lb mass removed and a massive wall of smaller tumors removed  . THORACIC AORTIC ENDOVASCULAR STENT GRAFT N/A 06/04/2019   Procedure: THORACIC  AORTIC ENDOVASCULAR STENT GRAFT;  Surgeon: Angelia Mould, MD;  Location: [facility] OR;  Service: Vascular;  Laterality: N/A;  . TRANSTHORACIC ECHOCARDIOGRAM  05/2008   EF =/>55%, mild MR; trace TR    Family History  Problem Relation Age of Onset  . Colon cancer Mother   . Lung cancer Mother   . Heart disease Mother   . Heart disease Father   . Ovarian cancer Sister   . Hepatitis Brother   . Hernia Brother     Social History Social History   Tobacco Use  . Smoking status: Current Every Day Smoker    Packs/day: 1.50    Years: 32.00    Pack years: 48.00    Types: Cigarettes  . Smokeless tobacco: Never Used  Vaping Use  . Vaping Use: Never used  Substance Use Topics  . Alcohol use: No  . Drug use: No    Current Outpatient Medications  Medication Sig Dispense Refill  . albuterol (PROVENTIL) (2.5 MG/3ML) 0.083% nebulizer  solution SMARTSIG:1 Nebule Via Nebulizer Every 6 Hours PRN    . albuterol-ipratropium (COMBIVENT) 18-103 MCG/ACT inhaler Inhale 2 puffs into the lungs every 6 (six) hours as needed for wheezing or shortness of breath. 1 Inhaler 1  . ALPRAZolam (XANAX) 0.5 MG tablet Take 0.5 mg by mouth as needed. Take 0.5 mg at bedtime every night. Take 0.5mg  once a day as needed for anxiety.    Marland Kitchen aspirin EC 81 MG tablet Take 81 mg by mouth daily.    Marland Kitchen atorvastatin (LIPITOR) 80 MG tablet Take 1 tablet (80 mg total) by mouth daily at 6 PM. 30 tablet 6  . COMBIVENT RESPIMAT 20-100 MCG/ACT AERS respimat Inhale 2 puffs into the lungs 4 (four) times daily.    . Doxepin HCl 6 MG TABS TAKE 1 TABLET BY MOUTH 30 MINUTES BEFORE BEDTIME ON AN EMPTY STOMACH    . ELIQUIS 5 MG TABS tablet TAKE 1 TABLET(5 MG) BY MOUTH TWICE DAILY 60 tablet 5  . empagliflozin (JARDIANCE) 25 MG TABS tablet Take 1 tablet (25 mg total) by mouth daily before breakfast. 90 tablet 3  . EPIPEN 2-PAK 0.3 MG/0.3ML SOAJ injection Inject 0.3 mg into the muscle as needed for anaphylaxis. Has available  1  . esomeprazole (NEXIUM) 40 MG capsule Take 1 capsule (40 mg total) by mouth daily before breakfast. 30 capsule 3  . ezetimibe (ZETIA) 10 MG tablet Take 10 mg by mouth daily.    . insulin aspart protamine - aspart (NOVOLOG MIX 70/30 FLEXPEN) (70-30) 100 UNIT/ML FlexPen Inject 0.2 mLs (20 Units total) into the skin daily with breakfast AND 0.28 mLs (28 Units total) daily with supper. 45 mL 3  . Insulin Pen Needle (BD PEN NEEDLE NANO 2ND GEN) 32G X 4 MM MISC Inject 1 Device into the skin 2 (two) times daily. 100 each 11  . levothyroxine (SYNTHROID) 50 MCG tablet Take 1 tablet (50 mcg total) by mouth daily. 30 tablet 3  . lisinopril (ZESTRIL) 10 MG tablet Take 1 tablet (10 mg total) by mouth daily. 30 tablet 5  . metFORMIN (GLUCOPHAGE) 500 MG tablet Take 1 tablet (500 mg total) by mouth 2 (two) times daily with a meal. 180 tablet 3  . ONETOUCH ULTRA test strip  TEST 7 TIMES DAILY AS DIRECTED 500 strip 2  . oxyCODONE (ROXICODONE) 15 MG immediate release tablet Take 15 mg by mouth every 4 (four) hours as needed for pain.     Marland Kitchen SHINGRIX injection     .  zolpidem (AMBIEN) 10 MG tablet Take 10 mg by mouth at bedtime as needed.     No current facility-administered medications for this visit.    Allergies  Allergen Reactions  . Bee Venom Anaphylaxis  . Penicillins Swelling, Rash and Other (See Comments)    Did it involve swelling of the face/tongue/throat, SOB, or low BP? Yes-whole body (swelling) Did it involve sudden or severe rash/hives, skin peeling, or any reaction on the inside of your mouth or nose? Yes-Hives Did you need to seek medical attention at a hospital or doctor's office? Yes When did it last happen?Childhood If all above answers are "NO", may proceed with cephalosporin use.  . Poison Oak Extract Anaphylaxis    Review of Systems  BP (!) 145/78 (BP Location: Right Arm, Patient Position: Sitting)   Pulse 100   Resp 20   Ht 5\' 5"  (1.651 m)   SpO2 94% Comment: RA  BMI 28.46 kg/m  Physical Exam   Diagnostic Tests: CT CHEST WITH CONTRAST  TECHNIQUE: Multidetector CT imaging of the chest was performed during intravenous contrast administration.  CONTRAST:  24mL OMNIPAQUE IOHEXOL 300 MG/ML  SOLN  COMPARISON:  05/22/2020, 04/20/2018 and 02/06/2015.  FINDINGS: Cardiovascular: Atherosclerotic calcification of the aorta and coronary arteries. Endovascular stent graft is seen in descending thoracic aorta. Heart size normal. No pericardial effusion.  Mediastinum/Nodes: No pathologically enlarged mediastinal, hilar or axillary lymph nodes. Esophagus is grossly unremarkable.  Lungs/Pleura: A mixed solid and ground-glass nodule in the apical segment right upper lobe measures 1.5 x 2.4 cm (4/24), similar to slightly increased in size from 05/22/2020, at which time it measured approximately 1.2 x 2.0 cm. The internal  solid component is roughly stable at 6 mm. When compared with exams dating back to 02/06/2015, there has been indolent growth, with the lesion measuring only 5 x 6 mm on 02/06/2015. Mild centrilobular emphysema. Left upper lobectomy. Pleuroparenchymal scarring at the base of the left hemithorax. Lungs are otherwise clear. No pleural fluid. Airway is otherwise unremarkable.  Upper Abdomen: Visualized portions of the liver and adrenal glands are unremarkable. Scarring in the kidneys bilaterally. Visualized portions of the spleen pancreas, stomach and bowel are unremarkable. Cholecystectomy. No upper abdominal adenopathy.  Musculoskeletal: Degenerative changes in the spine. No worrisome lytic or sclerotic lesions.  IMPRESSION: 1. Mixed solid and ground-glass nodule in the apical segment right upper lobe shows indolent growth from 02/06/2015 and is highly worrisome for adenocarcinoma. 2. Aortic atherosclerosis (ICD10-I70.0). Coronary artery calcification. 3.  Emphysema (ICD10-J43.9).   Electronically Signed   By: Lorin Picket M.D.   On: 11/26/2020 15:12 NUCLEAR MEDICINE PET SKULL BASE TO THIGH  TECHNIQUE: 9.45 mCi F-18 FDG was injected intravenously. Full-ring PET imaging was performed from the skull base to thigh after the radiotracer. CT data was obtained and used for attenuation correction and anatomic localization.  Fasting blood glucose: 141 mg/dl  COMPARISON:  November 26, 2020  FINDINGS: Mediastinal blood pool activity: SUV max 3.32  Liver activity: SUV max NA  NECK: No hypermetabolic lymph nodes in the neck.  Incidental CT findings: none  CHEST: Part solid nodule in the RIGHT upper lobe is unchanged compared to recent CT imaging and shows a maximum SUV of 1.4. Maximum size in the range of 2 cm. Internal solid component with similar appearance.  Incidental CT findings: Signs of aortic endovascular repair. Calcified atheromatous plaque in the  thoracic aorta. Three-vessel coronary artery disease. Normal heart size. No pericardial effusion. Limited assessment of cardiovascular structures given lack  of intravenous contrast. Signs of pulmonary emphysema. Evidence of prior partial lung resection, LEFT upper lobectomy. Esophagus grossly normal by CT.  ABDOMEN/PELVIS: No abnormal hypermetabolic activity within the liver, pancreas, adrenal glands, or spleen. No hypermetabolic lymph nodes in the abdomen or pelvis.  Incidental CT findings: Post cholecystectomy. Liver, pancreas, spleen, adrenal glands and kidneys without acute process. Marked RIGHT renal cortical scarring. No hydronephrosis. No acute gastrointestinal process. Colonic diverticulosis. Aortic atherosclerosis in the abdominal aorta without aneurysm. Uterus and adnexa unremarkable by CT.  Area of increased FDG uptake with a maximum SUV of 7.4 measuring 1.8 by 1.1 cm on image 314 of series 3 along the lower LEFT labia and medial thigh.  Mildly enlarged LEFT inguinal lymph node on image 288 of series 3 at 1 cm with a maximum SUV of 3.2.  SKELETON: No focal hypermetabolic activity to suggest skeletal metastasis.  Incidental CT findings: Spinal degenerative changes.  IMPRESSION: Area of concern in the RIGHT upper lobe shows no changes, morphologic features remain concerning for bronchogenic neoplasm. Nonspecific low level uptake less than mediastinal blood pool. FDG uptake could be seen in the setting of but are nonspecific.  Area of intense FDG uptake, focal and associated with either skin thickening or subcutaneous lesion about the LEFT labia and inter thigh. Findings are suspicious for small cutaneous or subcutaneous neoplasm. It is possible that urinary contamination could lead to spurious finding but given the focal thickening that is measurable on the current study in this location would suggest correlation with direct clinical inspection.  Mildly  prominent LEFT inguinal lymph node may be reactive, would be more suspicious if the finding in the LEFT labia or upper thigh described above is found to represent neoplasm. Uptake is near mediastinal blood pool.  These results will be called to the ordering clinician or representative by the Radiologist Assistant, and communication documented in the PACS or [employer].   Electronically Signed   By: Zetta Bills M.D.   On: 12/23/2020 09:13  I personally reviewed the CT and PET/CT images.  There is a mixed density nodule measuring about 1.5 x 2.5 cm in the apex of the right upper lobe.  There is no significant metabolic activity associated with this.  There is no evidence of regional or distant metastatic disease.  Pulmonary function testing 12/16/2020 FVC 2.60 (73%) FEV1 1.80 (64%) FEV1 1.93 (69%) postbronchodilator DLCO 13.55 (63%)  Impression: Marissa [name] is a 55 year old woman with a history of tobacco abuse, COPD, stage Ia adenocarcinoma of the lung status post left upper lobectomy in 2006, type 2 insulin-dependent diabetes, hypertension, coronary disease, thoracic aortic atherosclerosis, thoracic aortic stent graft, arthritis, hypothyroidism, reflux, and anxiety.  She has been followed with CT scans since her lobectomy back in 2006.  Over the past several years she has had a slowly growing mixed density nodule in the right upper lobe.  I reviewed the images with Marissa [name] and her husband.  We discussed the differential diagnosis.  They understand that this is highly concerning for a new primary bronchogenic carcinoma.  To date it would appear to be a low-grade lesion but there is concern with mixed density that there could be an invasive component and this could ultimately become more aggressive or spread.  Infectious inflammatory nodules are also in the differential diagnosis but are far less likely.  We discussed potential treatment options including surgical resection  versus stereotactic radiation.  We discussed the relative advantages and disadvantages of each approach.  She is adamantly  against having radiation.  The nodule is mixed density, slow-growing, peripheral, and has minimal activity on PET.  The solid component is less than a centimeter.  In addition she is already had a lobectomy on the other side.  I think it would be best to limit the resection to preserve her lung function.  We discussed the potential surgical procedure.  We would plan to do a navigational bronchoscopy to mark the nodule followed by a Xi robotic assisted right upper lobe wedge resection.  I informed him of the general nature of the procedure including the need for general anesthesia, the incisions to be used, the use of a drainage tube postoperatively, the expected hospital stay, and the overall recovery.  They understand there is always a possibility of a need for conversion to an open procedure.  I informed them of the indications, risks, benefits, and alternatives.  They understand the risks include, but are not limited to death, MI, DVT, PE, bleeding, possible need for transfusion, infection, prolonged air leak, cardiac arrhythmias, as well as possibility of other unstable complications.  Tobacco abuse-I spent approximately half the visit today counseling her on the importance of tobacco cessation.  She has had lung cancer, probably has a second lung cancer, hypertension, CAD, thoracic aortic atherosclerosis with embolization, and is at risk for other cardiovascular complications such as stroke.  She did say she would try to quit but did not seem convinced.  CAD-multiple risk factors.  No chest pain.  There is any indication for additional testing at this point but she does have significant cardiovascular risk given her history.  Anticoagulation for thoracic aortic atherosclerotic disease-stent graft in place.  She will need to hold Eliquis for 2 days prior to any surgical  procedure.  COPD-FEV1 and DLCO were both about two thirds of predicted.  She would tolerate a lobectomy should it be necessary but unless we have issues with margin we should be able to treat this relatively low-grade lesion with a wedge resection.  Type 2 diabetes-on p.o. meds and insulin.  Her most recent A1c was 8.4.  Just down from about 14 a year ago.  Plan: Quit smoking Tentatively plan for navigational bronchoscopy for tumor marking followed by robotic right upper lobe wedge resection on Wednesday, 02/19/2021  Melrose Nakayama, MD [facility] [phone]

## 2021-01-08 ENCOUNTER — Encounter: Payer: Self-pay | Admitting: *Deleted

## 2021-01-08 ENCOUNTER — Other Ambulatory Visit: Payer: Self-pay | Admitting: *Deleted

## 2021-01-08 DIAGNOSIS — R911 Solitary pulmonary nodule: Secondary | ICD-10-CM

## 2021-01-10 DIAGNOSIS — Z1212 Encounter for screening for malignant neoplasm of rectum: Secondary | ICD-10-CM | POA: Diagnosis not present

## 2021-01-10 DIAGNOSIS — Z1211 Encounter for screening for malignant neoplasm of colon: Secondary | ICD-10-CM | POA: Diagnosis not present

## 2021-01-13 ENCOUNTER — Encounter: Payer: Self-pay | Admitting: *Deleted

## 2021-01-22 DIAGNOSIS — C3491 Malignant neoplasm of unspecified part of right bronchus or lung: Secondary | ICD-10-CM | POA: Diagnosis not present

## 2021-01-22 DIAGNOSIS — K219 Gastro-esophageal reflux disease without esophagitis: Secondary | ICD-10-CM | POA: Diagnosis not present

## 2021-01-22 DIAGNOSIS — G894 Chronic pain syndrome: Secondary | ICD-10-CM | POA: Diagnosis not present

## 2021-01-26 ENCOUNTER — Ambulatory Visit: Payer: Medicare HMO | Admitting: Internal Medicine

## 2021-01-26 NOTE — Progress Notes (Deleted)
Name: Marissa [name]  Age/ Sex: 55 y.o., female   MRN/ DOB: [id number], 1965-10-16     PCP: Redmond School, MD   Reason for Endocrinology Evaluation: Type 2 Diabetes Mellitus  Initial Endocrine Consultative Visit: 06/04/2019    PATIENT IDENTIFIER: [name] is a 55 y.o. female with a past medical history of HTN,T2DM, Dyslipidemia and Lung Ca (2006) . The patient has followed with Endocrinology clinic since 06/03/2001 for consultative assistance with management of her diabetes.  DIABETIC HISTORY:  Marissa [name] was diagnosed with T2DM in 2006. She has been on oral glycemic agents in the past ( Glipizide, glimepiride, metformin, Farxiga ( not covered) , insulin added in 01/2019. Her hemoglobin A1c has ranged from 6.7% in 2015, peaking at 13.8% in 02/2019.   On her initial visit to our clinic her A1c was 10.8%  , she was on Glimepiride, Metformin and Levemir. We stopped Levemir, and Glimepiride , started insulin Mix and reduced metformin dose due to nausea and diarrhea.  Jardiance started 10/2019  SUBJECTIVE:   During the last visit (09/26/2020): A1c 8.4 %. We continued metformin,  Increased Jardiance and  insulin mix   Today (01/26/2021): Marissa [name] is here for a 4 month follow up on diabetes management. She checks her blood sugars 2 -3 times daily, preprandial to breakfast and supper. The patient has not had hypoglycemic episodes since the last clinic visit.   Scheduled for a right upper lobe wedge resection   HOME DIABETES REGIMEN:  Metformin 500 mg ,1 tablet BID  Novolog Mix 20 units with Breakfast and 28 units with Supper  Jardiance 25 mg daily     METER DOWNLOAD SUMMARY: Forgot to bring         DIABETIC COMPLICATIONS: Microvascular complications:   Neuropathy , glaucoma   Denies: CKD , retinopathy   Last eye exam: Completed 2021  Macrovascular complications:   CAD ( S/P PCI )   Denies:  PVD, CVA   HISTORY:  Past Medical History:  Past Medical  History:  Diagnosis Date  . Adenocarcinoma of lung ([place]) 02/06/2014  . Anxiety   . Arthritis   . COPD (chronic obstructive pulmonary disease) ([place])   . Coronary artery disease   . Diabetes mellitus   . GERD (gastroesophageal reflux disease)   . Hypertension   . Hypothyroidism   . Lung cancer [facility])    Past Surgical History:  Past Surgical History:  Procedure Laterality Date  . APPENDECTOMY    . CHOLECYSTECTOMY    . LOBECTOMY Left 2006  . NM MYOCAR IMG MI  05/2008   lexiscan -perfusion defect in anterior myocardium (breast attenuation); remaining myocardium with NO evidence of ischemia or infarct; EF 78%; low risk scan  . OOPHORECTOMY Left   . PARTIAL HYSTERECTOMY  1985  . SALPINGOOPHORECTOMY Left    benign 9lb mass removed and a massive wall of smaller tumors removed  . THORACIC AORTIC ENDOVASCULAR STENT GRAFT N/A 06/04/2019   Procedure: THORACIC AORTIC ENDOVASCULAR STENT GRAFT;  Surgeon: Angelia Mould, MD;  Location: [facility] OR;  Service: Vascular;  Laterality: N/A;  . TRANSTHORACIC ECHOCARDIOGRAM  05/2008   EF =/>55%, mild MR; trace TR    Social History:  reports that she has been smoking cigarettes. She has a 48.00 pack-year smoking history. She has never used smokeless tobacco. She reports that she does not drink alcohol and does not use drugs. Family History:  Family History  Problem Relation Age of Onset  . Colon cancer  Mother   . Lung cancer Mother   . Heart disease Mother   . Heart disease Father   . Ovarian cancer Sister   . Hepatitis Brother   . Hernia Brother      HOME MEDICATIONS: Allergies as of 01/26/2021      Reactions   Bee Venom Anaphylaxis   Penicillins Swelling, Rash, Other (See Comments)   Did it involve swelling of the face/tongue/throat, SOB, or low BP? Yes-whole body (swelling) Did it involve sudden or severe rash/hives, skin peeling, or any reaction on the inside of your mouth or nose? Yes-Hives Did you need to seek medical attention at a  hospital or doctor's office? Yes When did it last happen?Childhood If all above answers are "NO", may proceed with cephalosporin use.   Poison Oak Extract Anaphylaxis      Medication List       Accurate as of January 26, 2021 10:06 AM. If you have any questions, ask your nurse or doctor.        albuterol (2.5 MG/3ML) 0.083% nebulizer solution Commonly known as: PROVENTIL SMARTSIG:1 Nebule Via Nebulizer Every 6 Hours PRN   albuterol-ipratropium 18-103 MCG/ACT inhaler Commonly known as: COMBIVENT Inhale 2 puffs into the lungs every 6 (six) hours as needed for wheezing or shortness of breath.   Combivent Respimat 20-100 MCG/ACT Aers respimat Generic drug: Ipratropium-Albuterol Inhale 2 puffs into the lungs 4 (four) times daily.   ALPRAZolam 0.5 MG tablet Commonly known as: XANAX Take 0.5 mg by mouth as needed. Take 0.5 mg at bedtime every night. Take 0.5mg  once a day as needed for anxiety.   aspirin EC 81 MG tablet Take 81 mg by mouth daily.   atorvastatin 80 MG tablet Commonly known as: LIPITOR Take 1 tablet (80 mg total) by mouth daily at 6 PM.   BD Pen Needle Nano 2nd Gen 32G X 4 MM Misc Generic drug: Insulin Pen Needle Inject 1 Device into the skin 2 (two) times daily.   Doxepin HCl 6 MG Tabs TAKE 1 TABLET BY MOUTH 30 MINUTES BEFORE BEDTIME ON AN EMPTY STOMACH   Eliquis 5 MG Tabs tablet Generic drug: apixaban TAKE 1 TABLET(5 MG) BY MOUTH TWICE DAILY   empagliflozin 25 MG Tabs tablet Commonly known as: Jardiance Take 1 tablet (25 mg total) by mouth daily before breakfast.   EpiPen 2-Pak 0.3 mg/0.3 mL Soaj injection Generic drug: EPINEPHrine Inject 0.3 mg into the muscle as needed for anaphylaxis. Has available   esomeprazole 40 MG capsule Commonly known as: NEXIUM Take 1 capsule (40 mg total) by mouth daily before breakfast.   ezetimibe 10 MG tablet Commonly known as: ZETIA Take 10 mg by mouth daily.   levothyroxine 50 MCG tablet Commonly known as:  SYNTHROID Take 1 tablet (50 mcg total) by mouth daily.   lisinopril 10 MG tablet Commonly known as: ZESTRIL Take 1 tablet (10 mg total) by mouth daily.   metFORMIN 500 MG tablet Commonly known as: Glucophage Take 1 tablet (500 mg total) by mouth 2 (two) times daily with a meal.   NovoLOG Mix 70/30 FlexPen (70-30) 100 UNIT/ML FlexPen Generic drug: insulin aspart protamine - aspart Inject 0.2 mLs (20 Units total) into the skin daily with breakfast AND 0.28 mLs (28 Units total) daily with supper.   OneTouch Ultra test strip Generic drug: glucose blood TEST 7 TIMES DAILY AS DIRECTED   oxyCODONE 15 MG immediate release tablet Commonly known as: ROXICODONE Take 15 mg by mouth every 4 (four) hours  as needed for pain.   Shingrix injection Generic drug: Zoster Vaccine Adjuvanted   zolpidem 10 MG tablet Commonly known as: AMBIEN Take 10 mg by mouth at bedtime as needed.        OBJECTIVE:   Vital Signs: There were no vitals taken for this visit.  Wt Readings from Last 3 Encounters:  12/31/20 171 lb (77.6 kg)  12/01/20 169 lb 6.4 oz (76.8 kg)  09/26/20 169 lb 8 oz (76.9 kg)     Exam: General: Pt appears well and is in NAD  Lungs: Clear with good BS bilat with no rales, rhonchi, or wheezes  Heart: RRR with normal S1 and S2 and no gallops; no murmurs; no rub  Abdomen: Normoactive bowel sounds, soft, nontender  Extremities: No pretibial edema.   Neuro: MS is good with appropriate affect, pt is alert and Ox3       DM foot exam: 05/26/2020  The skin of the feet is intact without sores or ulcerations. The pedal pulses are 2+ on right and 2+ on left. The sensation is decreased to a screening 5.07, 10 gram monofilament bilaterally   DATA REVIEWED:  Lab Results  Component Value Date   HGBA1C 8.4 (A) 09/26/2020   HGBA1C 8.1 (A) 05/26/2020   HGBA1C 7.3 (A) 01/23/2020   Results for Marissa [name], Marissa [name] (MRN [id number]) as of 09/26/2020 13:45  Ref. Range 05/22/2020 08:48   COMPREHENSIVE METABOLIC PANEL Unknown Rpt (A)  Sodium Latest Ref Range: 135 - 145 mmol/L 138  Potassium Latest Ref Range: 3.5 - 5.1 mmol/L 3.8  Chloride Latest Ref Range: 98 - 111 mmol/L 103  CO2 Latest Ref Range: 22 - 32 mmol/L 23  Glucose Latest Ref Range: 70 - 99 mg/dL 182 (H)  BUN Latest Ref Range: 6 - 20 mg/dL 15  Creatinine Latest Ref Range: 0.44 - 1.00 mg/dL 0.63  Calcium Latest Ref Range: 8.9 - 10.3 mg/dL 9.2  Anion gap Latest Ref Range: 5 - 15  12  Alkaline Phosphatase Latest Ref Range: 38 - 126 U/L 98  Albumin Latest Ref Range: 3.5 - 5.0 g/dL 4.2  AST Latest Ref Range: 15 - 41 U/L 17  ALT Latest Ref Range: 0 - 44 U/L 31  Total Protein Latest Ref Range: 6.5 - 8.1 g/dL 7.3  Total Bilirubin Latest Ref Range: 0.3 - 1.2 mg/dL 0.6  GFR, Est Non African American Latest Ref Range: >60 mL/min >60  GFR, Est African American Latest Ref Range: >60 mL/min >60  LDH Latest Ref Range: 98 - 192 U/L 120  Vitamin D, 25-Hydroxy Latest Ref Range: 30 - 100 ng/mL 12.13 (L)  Vitamin B12 Latest Ref Range: 180 - 914 pg/mL 202     ASSESSMENT / PLAN / RECOMMENDATIONS:   1) Type 2 Diabetes Mellitus,Sub- Optimally  controlled, With Neuropathic and Macrovascular  complications - Most recent A1c of 8.4 %. Goal A1c < 7.0 %.   -  A1c stable  - Will increase insulin and Jardiance as below     MEDICATIONS:  Continue Metformin 500 mg BID  Increase Novolog Mix  20 units with Breakfast and 28 units with Supper   Increase Jardiance 25 mg daily   EDUCATION / INSTRUCTIONS:  BG monitoring instructions: Patient is instructed to check her blood sugars 2 times a day, breakfast and supper.  Call [facility] if: BG persistently < 70  . I reviewed the Rule of 15 for the treatment of hypoglycemia in detail with the patient. Literature supplied.  F/U in 4 months    Signed electronically by: Mack Guise, MD  [facility] Endocrinology  [facility] [address] [place], [place] [address] Phone: [phone] FAX: [phone]   CC: [address], [place] [address] Phone: [phone]  Fax: [phone]  Return to Endocrinology clinic as below: Future Appointments  Date Time Provider [place]  01/26/2021  3:00 PM Randa Riss, Melanie Crazier, MD [facility]-LBENDO None  02/17/2021 11:00 AM AP-SCREENING AP-DOIBP None  02/17/2021  1:00 PM MC-DAHOC PAT 4 MC-SDSC None  03/31/2021  2:30 PM Derek Jack, MD AP-ACAPA None

## 2021-02-16 ENCOUNTER — Other Ambulatory Visit (HOSPITAL_COMMUNITY): Payer: Self-pay

## 2021-02-16 ENCOUNTER — Other Ambulatory Visit: Payer: Self-pay

## 2021-02-16 ENCOUNTER — Encounter: Payer: Self-pay | Admitting: Internal Medicine

## 2021-02-16 ENCOUNTER — Ambulatory Visit (INDEPENDENT_AMBULATORY_CARE_PROVIDER_SITE_OTHER): Payer: Medicare HMO | Admitting: Internal Medicine

## 2021-02-16 VITALS — BP 140/74 | HR 90 | Ht 65.0 in | Wt 169.5 lb

## 2021-02-16 DIAGNOSIS — Z794 Long term (current) use of insulin: Secondary | ICD-10-CM | POA: Diagnosis not present

## 2021-02-16 DIAGNOSIS — E1165 Type 2 diabetes mellitus with hyperglycemia: Secondary | ICD-10-CM | POA: Diagnosis not present

## 2021-02-16 DIAGNOSIS — E876 Hypokalemia: Secondary | ICD-10-CM | POA: Diagnosis not present

## 2021-02-16 LAB — POCT GLYCOSYLATED HEMOGLOBIN (HGB A1C): Hemoglobin A1C: 7.7 % — AB (ref 4.0–5.6)

## 2021-02-16 LAB — TSH: TSH: 1.46 u[IU]/mL (ref 0.35–4.50)

## 2021-02-16 NOTE — Progress Notes (Addendum)
Name: Marissa [name]  Age/ Sex: 55 y.o., female   MRN/ DOB: [id number], [birth year]     PCP: Redmond School, MD   Reason for Endocrinology Evaluation: Type 2 Diabetes Mellitus  Initial Endocrine Consultative Visit: 06/04/2019    PATIENT IDENTIFIER: Marissa [name] is a 55 y.o. female with a past medical history of HTN,T2DM, Dyslipidemia and Lung Ca (2006) . The patient has followed with Endocrinology clinic since 06/03/2001 for consultative assistance with management of her diabetes.  DIABETIC HISTORY:  Marissa [name] was diagnosed with T2DM in 2006. She has been on oral glycemic agents in the past ( Glipizide, glimepiride, metformin, Farxiga ( not covered) , insulin added in 01/2019. Her hemoglobin A1c has ranged from 6.7% in 2015, peaking at 13.8% in 02/2019.   On her initial visit to our clinic her A1c was 10.8%  , she was on Glimepiride, Metformin and Levemir. We stopped Levemir, and Glimepiride , started insulin Mix and reduced metformin dose due to nausea and diarrhea.  Jardiance started 10/2019  SUBJECTIVE:   During the last visit (09/26/2020): A1c 8.4 %. We continued metformin,  Increased Jardiance and  insulin mix   Today (02/16/2021): Marissa [name] is here for a follow up on diabetes management. She checks her blood sugars 1-2 times daily, preprandial to breakfast and supper. The patient has not had hypoglycemic episodes since the last clinic visit.   Denies nausea or vomiting  Weight stable   Scheduled for a right upper lobe wedge resection for adenocarcinoma   HOME DIABETES REGIMEN:  Metformin 500 mg ,1 tablet BID  Novolog Mix 20 units with Breakfast and 28 units with Supper  Jardiance 25 mg daily    METER DOWNLOAD SUMMARY: 4/25-02/16/2021  Average Number Tests/Day = 1.4 Overall Mean FS Glucose = 152 Standard Deviation = 35  BG Ranges: Low = 112 High = 242    Hypoglycemic Events/30 Days: BG < 50 = 0 Episodes of symptomatic severe hypoglycemia =  0       DIABETIC COMPLICATIONS: Microvascular complications:   Neuropathy , glaucoma   Denies: CKD , retinopathy   Last eye exam: Completed 2021  Macrovascular complications:   CAD ( S/P PCI )   Denies:  PVD, CVA   HISTORY:  Past Medical History:  Past Medical History:  Diagnosis Date  . Adenocarcinoma of lung ([place]) 02/06/2014  . Anxiety   . Arthritis   . COPD (chronic obstructive pulmonary disease) ([place])   . Coronary artery disease   . Diabetes mellitus   . GERD (gastroesophageal reflux disease)   . Hypertension   . Hypothyroidism   . Lung cancer [facility])    Past Surgical History:  Past Surgical History:  Procedure Laterality Date  . APPENDECTOMY    . CHOLECYSTECTOMY    . LOBECTOMY Left 2006  . NM MYOCAR IMG MI  05/2008   lexiscan -perfusion defect in anterior myocardium (breast attenuation); remaining myocardium with NO evidence of ischemia or infarct; EF 78%; low risk scan  . OOPHORECTOMY Left   . PARTIAL HYSTERECTOMY  1985  . SALPINGOOPHORECTOMY Left    benign 9lb mass removed and a massive wall of smaller tumors removed  . THORACIC AORTIC ENDOVASCULAR STENT GRAFT N/A 06/04/2019   Procedure: THORACIC AORTIC ENDOVASCULAR STENT GRAFT;  Surgeon: Angelia Mould, MD;  Location: [facility] OR;  Service: Vascular;  Laterality: N/A;  . TRANSTHORACIC ECHOCARDIOGRAM  05/2008   EF =/>55%, mild MR; trace TR    Social History:  reports that  she has been smoking cigarettes. She has a 48.00 pack-year smoking history. She has never used smokeless tobacco. She reports that she does not drink alcohol and does not use drugs. Family History:  Family History  Problem Relation Age of Onset  . Colon cancer Mother   . Lung cancer Mother   . Heart disease Mother   . Heart disease Father   . Ovarian cancer Sister   . Hepatitis Brother   . Hernia Brother      HOME MEDICATIONS: Allergies as of 02/16/2021      Reactions   Bee Venom Anaphylaxis   Penicillins Swelling, Rash,  Other (See Comments)   Did it involve swelling of the face/tongue/throat, SOB, or low BP? Yes-whole body (swelling) Did it involve sudden or severe rash/hives, skin peeling, or any reaction on the inside of your mouth or nose? Yes-Hives Did you need to seek medical attention at a hospital or doctor's office? Yes When did it last happen?Childhood If all above answers are "NO", may proceed with cephalosporin use.   Poison Oak Extract Anaphylaxis      Medication List       Accurate as of Feb 16, 2021  3:55 PM. If you have any questions, ask your nurse or doctor.        albuterol (2.5 MG/3ML) 0.083% nebulizer solution Commonly known as: PROVENTIL Take 2.5 mg by nebulization every 6 (six) hours as needed for shortness of breath or wheezing.   albuterol-ipratropium 18-103 MCG/ACT inhaler Commonly known as: COMBIVENT Inhale 2 puffs into the lungs every 6 (six) hours as needed for wheezing or shortness of breath.   ALPRAZolam 0.5 MG tablet Commonly known as: XANAX Take 0.5 mg by mouth at bedtime.   aspirin EC 81 MG tablet Take 81 mg by mouth daily.   atorvastatin 80 MG tablet Commonly known as: LIPITOR Take 1 tablet (80 mg total) by mouth daily at 6 PM.   BD Pen Needle Nano 2nd Gen 32G X 4 MM Misc Generic drug: Insulin Pen Needle Inject 1 Device into the skin 2 (two) times daily.   Eliquis 5 MG Tabs tablet Generic drug: apixaban TAKE 1 TABLET(5 MG) BY MOUTH TWICE DAILY What changed: See the new instructions.   empagliflozin 25 MG Tabs tablet Commonly known as: Jardiance Take 1 tablet (25 mg total) by mouth daily before breakfast.   esomeprazole 40 MG capsule Commonly known as: NEXIUM Take 1 capsule (40 mg total) by mouth daily before breakfast.   ezetimibe 10 MG tablet Commonly known as: ZETIA Take 10 mg by mouth daily.   latanoprost 0.005 % ophthalmic solution Commonly known as: XALATAN Place 1 drop into both eyes at bedtime.   levothyroxine 50 MCG  tablet Commonly known as: SYNTHROID Take 1 tablet (50 mcg total) by mouth daily.   lisinopril 10 MG tablet Commonly known as: ZESTRIL Take 1 tablet (10 mg total) by mouth daily.   metFORMIN 500 MG tablet Commonly known as: Glucophage Take 1 tablet (500 mg total) by mouth 2 (two) times daily with a meal.   NovoLOG Mix 70/30 FlexPen (70-30) 100 UNIT/ML FlexPen Generic drug: insulin aspart protamine - aspart Inject 0.2 mLs (20 Units total) into the skin daily with breakfast AND 0.28 mLs (28 Units total) daily with supper.   OneTouch Ultra test strip Generic drug: glucose blood TEST 7 TIMES DAILY AS DIRECTED   oxyCODONE 15 MG immediate release tablet Commonly known as: ROXICODONE Take 15 mg by mouth every 6 (six) hours  as needed for pain.   zolpidem 10 MG tablet Commonly known as: AMBIEN Take 10 mg by mouth at bedtime.        OBJECTIVE:   Vital Signs: BP 140/74   Pulse 90   Ht 5\' 5"  (1.651 m)   Wt 169 lb 8 oz (76.9 kg)   SpO2 97%   BMI 28.21 kg/m   Wt Readings from Last 3 Encounters:  02/16/21 169 lb 8 oz (76.9 kg)  12/31/20 171 lb (77.6 kg)  12/01/20 169 lb 6.4 oz (76.8 kg)     Exam: General: Pt appears well and is in NAD  Lungs: Clear with good BS bilat with no rales, rhonchi, or wheezes  Heart: RRR with normal S1 and S2 and no gallops; no murmurs; no rub  Abdomen: Normoactive bowel sounds, soft, nontender  Extremities: No pretibial edema.   Neuro: MS is good with appropriate affect, pt is alert and Ox3       DM foot exam: 05/26/2020  The skin of the feet is intact without sores or ulcerations. The pedal pulses are 2+ on right and 2+ on left. The sensation is decreased to a screening 5.07, 10 gram monofilament bilaterally   DATA REVIEWED:  Lab Results  Component Value Date   HGBA1C 7.7 (A) 02/16/2021   HGBA1C 8.4 (A) 09/26/2020   HGBA1C 8.1 (A) 05/26/2020   Results for MERTIE, HASLEM (MRN [id number]) as of 02/17/2021 13:20  Ref. Range 02/16/2021  16:17  Sodium Latest Ref Range: 135 - 145 mEq/L 138  Potassium Latest Ref Range: 3.5 - 5.1 mEq/L 4.3  Chloride Latest Ref Range: 96 - 112 mEq/L 103  CO2 Latest Ref Range: 19 - 32 mEq/L 23  Glucose Latest Ref Range: 70 - 99 mg/dL 110 (H)  BUN Latest Ref Range: 6 - 23 mg/dL 13  Creatinine Latest Ref Range: 0.40 - 1.20 mg/dL 0.62  Calcium Latest Ref Range: 8.4 - 10.5 mg/dL 9.7  GFR Latest Ref Range: >60.00 mL/min 99.84  MICROALB/CREAT RATIO Latest Ref Range: 0.0 - 30.0 mg/g 2.2  TSH Latest Ref Range: 0.35 - 4.50 uIU/mL 1.46  T4,Free(Direct) Latest Ref Range: 0.60 - 1.60 ng/dL 1.01  Creatinine,U Latest Units: mg/dL 37.9  Microalb, Ur Latest Ref Range: 0.0 - 1.9 mg/dL 0.8     ASSESSMENT / PLAN / RECOMMENDATIONS:   1) Type 2 Diabetes Mellitus,Sub- Optimally  controlled, With Neuropathic and Macrovascular  complications - Most recent A1c of 8.4 %. Goal A1c < 7.0 %.   - Praised pt on the improved glycemic control  - She is scheduled for a pulmonary wedge resection this coming thurrsday - Will increase insulin as below  - She was advised to hold the Jardiance 2 days prior to her lung sx     MEDICATIONS:  Continue Metformin 500 mg BID  Increase Novolog Mix  22 units with Breakfast and 28 units with Supper   Continue Jardiance 25 mg daily   EDUCATION / INSTRUCTIONS:  BG monitoring instructions: Patient is instructed to check her blood sugars 2 times a day, breakfast and supper.  Call [facility] if: BG persistently < 70  . I reviewed the Rule of 15 for the treatment of hypoglycemia in detail with the patient. Literature supplied.   2) Hypokalemia :  - She has been noted with spontaneous hypokalemia. She is not on any diuretics. Will check Aldo: renin . Will postpone checking for cortisol at this time until after her sx F/U in 4 months  Signed electronically by: Mack Guise, MD  [facility] Endocrinology  [facility] [address] [place], [place] [address] Phone: [phone] FAX: [phone]   CC: [address], [place] [address] Phone: [phone]  Fax: [phone]  Return to Endocrinology clinic as below: Future Appointments  Date Time Provider [place]  02/17/2021 11:00 AM AP-SCREENING AP-DOIBP None  02/17/2021  1:00 PM MC-DAHOC PAT 4 MC-SDSC None  03/31/2021  2:30 PM Derek Jack, MD AP-ACAPA None

## 2021-02-16 NOTE — Progress Notes (Addendum)
Surgical Instructions    Your procedure is scheduled on Thursday, May 12th.  Report to [facility] Main Entrance "A" at 5:45 A.M., then check in with the Admitting office.  Call this number if you have problems the morning of surgery:  [phone]   If you have any questions prior to your surgery date call [phone]: Open Monday-Friday 8am-4pm    Remember:  Do not eat or drink after midnight the night before your surgery     Take these medicines the morning of surgery with A SIP OF WATER   Albuterol inhaler - if needed  Albuterol nebulizer - if needed  Atorvastatin (Lipitor)  Esomeprazole (Nexium)  Ezetimibe (Zetia)  Eye drops - if needed  Levothyroxine (Synthroid)  Oxycodone - if needed   Follow your surgeon's instructions on when to stop Eliquis & Aspirin.  If no instructions were given by your surgeon then you will need to call the office to get those instructions.    As of today, STOP taking any Aspirin (unless otherwise instructed by your surgeon) Aleve, Naproxen, Ibuprofen, Motrin, Advil, Goody's, BC's, all herbal medications, fish oil, and all vitamins.   WHAT DO I DO ABOUT MY DIABETES MEDICATION?   Marland Kitchen Do not take oral diabetes medicines (pills) the morning of surgery. - Jardiance, & Metformin  . THE NIGHT BEFORE SURGERY, take 20 units of Novolog 70/30  . THE DAY BEFORE SURGERY, do NOT take Jardiance      . THE MORNING OF SURGERY, DO NOT TAKE Novolog 70/30  . The day of surgery, do not take other diabetes injectables, including Byetta (exenatide), Bydureon (exenatide ER), Victoza (liraglutide), or Trulicity (dulaglutide).    HOW TO MANAGE YOUR DIABETES BEFORE AND AFTER SURGERY  Why is it important to control my blood sugar before and after surgery? . Improving blood sugar levels before and after surgery helps healing and can limit problems. . A way of improving blood sugar control is eating a healthy diet by: o  Eating less sugar and carbohydrates o   Increasing activity/exercise o  Talking with your doctor about reaching your blood sugar goals . High blood sugars (greater than 180 mg/dL) can raise your risk of infections and slow your recovery, so you will need to focus on controlling your diabetes during the weeks before surgery. . Make sure that the doctor who takes care of your diabetes knows about your planned surgery including the date and location.  How do I manage my blood sugar before surgery? . Check your blood sugar at least 4 times a day, starting 2 days before surgery, to make sure that the level is not too high or low. . Check your blood sugar the morning of your surgery when you wake up and every 2 hours until you get to the Short Stay unit. o If your blood sugar is less than 70 mg/dL, you will need to treat for low blood sugar: - Do not take insulin. - Treat a low blood sugar (less than 70 mg/dL) with  cup of clear juice (cranberry or apple), 4 glucose tablets, OR glucose gel. - Recheck blood sugar in 15 minutes after treatment (to make sure it is greater than 70 mg/dL). If your blood sugar is not greater than 70 mg/dL on recheck, call [phone] for further instructions. . Report your blood sugar to the short stay nurse when you get to Short Stay.  . If you are admitted to the hospital after surgery: o Your blood sugar will be checked  by the staff and you will probably be given insulin after surgery (instead of oral diabetes medicines) to make sure you have good blood sugar levels. o The goal for blood sugar control after surgery is 80-180 mg/dL.                      DAY OF SURGERY:            Do not wear jewelry, make up, or nail polish            Do not wear lotions, powders, perfumes/colognes, or deodorant.            Do not shave 48 hours prior to surgery.  Men may shave face and neck.            Do not bring valuables to the hospital.            [facility] is not responsible for any belongings or valuables.  Do  NOT Smoke (Tobacco/Vaping) or drink Alcohol 24 hours prior to your procedure If you use a CPAP at night, you may bring all equipment for your overnight stay.   Contacts, glasses, dentures or bridgework may not be worn into surgery, please bring cases for these belongings   For patients admitted to the hospital, discharge time will be determined by your treatment team.   Patients discharged the day of surgery will not be allowed to drive home, and someone needs to stay with them for 24 hours.    Special instructions:   [place]- Preparing For Surgery  Before surgery, you can play an important role. Because skin is not sterile, your skin needs to be as free of germs as possible. You can reduce the number of germs on your skin by washing with CHG (chlorahexidine gluconate) Soap before surgery.  CHG is an antiseptic cleaner which kills germs and bonds with the skin to continue killing germs even after washing.    Oral Hygiene is also important to reduce your risk of infection.  Remember - BRUSH YOUR TEETH THE MORNING OF SURGERY WITH YOUR REGULAR TOOTHPASTE  Please do not use if you have an allergy to CHG or antibacterial soaps. If your skin becomes reddened/irritated stop using the CHG.  Do not shave (including legs and underarms) for at least 48 hours prior to first CHG shower. It is OK to shave your face.  Please follow these instructions carefully.   1. Shower the NIGHT BEFORE SURGERY and the MORNING OF SURGERY  2. If you chose to wash your hair, wash your hair first as usual with your normal shampoo.  3. After you shampoo, rinse your hair and body thoroughly to remove the shampoo.  4. Wash Face and genitals (private parts) with your normal soap.   5.  Shower the NIGHT BEFORE SURGERY and the MORNING OF SURGERY with CHG Soap.   6. Use CHG Soap as you would any other liquid soap. You can apply CHG directly to the skin and wash gently with a scrungie or a clean washcloth.   7. Apply  the CHG Soap to your body ONLY FROM THE NECK DOWN.  Do not use on open wounds or open sores. Avoid contact with your eyes, ears, mouth and genitals (private parts). Wash Face and genitals (private parts)  with your normal soap.   8. Wash thoroughly, paying special attention to the area where your surgery will be performed.  9. Thoroughly rinse your body with warm water from  the neck down.  10. DO NOT shower/wash with your normal soap after using and rinsing off the CHG Soap.  11. Pat yourself dry with a CLEAN TOWEL.  12. Wear CLEAN PAJAMAS to bed the night before surgery  13. Place CLEAN SHEETS on your bed the night before your surgery  14. DO NOT SLEEP WITH PETS.   Day of Surgery: Take a shower with CHG soap. Wear Clean/Comfortable clothing the morning of surgery Do not apply any deodorants/lotions.   Remember to brush your teeth WITH YOUR REGULAR TOOTHPASTE.   Please read over the following fact sheets that you were given.

## 2021-02-16 NOTE — Patient Instructions (Addendum)
-   Metformin ONE tablet with Breakfast and ONE tablet with Supper - Novolog Mix 22  units with Breakfast and 28 units with Supper  - Jardiance 25 mg with breakfast     - Hold Jardiance after tomorrow     -HOW TO TREAT LOW BLOOD SUGARS (Blood sugar LESS THAN 70 MG/DL)  Please follow the RULE OF 15 for the treatment of hypoglycemia treatment (when your (blood sugars are less than 70 mg/dL)    STEP 1: Take 15 grams of carbohydrates when your blood sugar is low, which includes:   3-4 GLUCOSE TABS  OR  3-4 OZ OF JUICE OR REGULAR SODA OR  ONE TUBE OF GLUCOSE GEL     STEP 2: RECHECK blood sugar in 15 MINUTES STEP 3: If your blood sugar is still low at the 15 minute recheck --> then, go back to STEP 1 and treat AGAIN with another 15 grams of carbohydrates.

## 2021-02-17 ENCOUNTER — Other Ambulatory Visit: Payer: Self-pay

## 2021-02-17 ENCOUNTER — Encounter (HOSPITAL_COMMUNITY)
Admission: RE | Admit: 2021-02-17 | Discharge: 2021-02-17 | Disposition: A | Discharge status: Home / Self Care | Payer: Medicare HMO | Source: Ambulatory Visit | Attending: Thoracic Surgery (Cardiothoracic Vascular Surgery) | Admitting: Thoracic Surgery (Cardiothoracic Vascular Surgery)

## 2021-02-17 ENCOUNTER — Encounter (HOSPITAL_COMMUNITY): Payer: Self-pay

## 2021-02-17 ENCOUNTER — Other Ambulatory Visit (HOSPITAL_COMMUNITY)
Admission: RE | Admit: 2021-02-17 | Discharge: 2021-02-17 | Disposition: A | Discharge status: Home / Self Care | Payer: Medicare HMO | Source: Ambulatory Visit | Attending: Thoracic Surgery (Cardiothoracic Vascular Surgery) | Admitting: Thoracic Surgery (Cardiothoracic Vascular Surgery)

## 2021-02-17 DIAGNOSIS — Z20822 Contact with and (suspected) exposure to covid-19: Secondary | ICD-10-CM | POA: Insufficient documentation

## 2021-02-17 DIAGNOSIS — Z01818 Encounter for other preprocedural examination: Secondary | ICD-10-CM | POA: Insufficient documentation

## 2021-02-17 DIAGNOSIS — J449 Chronic obstructive pulmonary disease, unspecified: Secondary | ICD-10-CM | POA: Diagnosis not present

## 2021-02-17 DIAGNOSIS — J95812 Postprocedural air leak: Secondary | ICD-10-CM | POA: Diagnosis not present

## 2021-02-17 DIAGNOSIS — R911 Solitary pulmonary nodule: Secondary | ICD-10-CM

## 2021-02-17 DIAGNOSIS — I700 Atherosclerosis of aorta: Secondary | ICD-10-CM | POA: Insufficient documentation

## 2021-02-17 DIAGNOSIS — R69 Illness, unspecified: Secondary | ICD-10-CM | POA: Diagnosis not present

## 2021-02-17 DIAGNOSIS — C3411 Malignant neoplasm of upper lobe, right bronchus or lung: Secondary | ICD-10-CM | POA: Diagnosis not present

## 2021-02-17 DIAGNOSIS — K219 Gastro-esophageal reflux disease without esophagitis: Secondary | ICD-10-CM | POA: Diagnosis not present

## 2021-02-17 DIAGNOSIS — J439 Emphysema, unspecified: Secondary | ICD-10-CM | POA: Insufficient documentation

## 2021-02-17 DIAGNOSIS — F17210 Nicotine dependence, cigarettes, uncomplicated: Secondary | ICD-10-CM | POA: Insufficient documentation

## 2021-02-17 DIAGNOSIS — I10 Essential (primary) hypertension: Secondary | ICD-10-CM | POA: Diagnosis not present

## 2021-02-17 DIAGNOSIS — Z794 Long term (current) use of insulin: Secondary | ICD-10-CM | POA: Diagnosis not present

## 2021-02-17 DIAGNOSIS — E039 Hypothyroidism, unspecified: Secondary | ICD-10-CM | POA: Diagnosis not present

## 2021-02-17 DIAGNOSIS — C3490 Malignant neoplasm of unspecified part of unspecified bronchus or lung: Secondary | ICD-10-CM | POA: Diagnosis not present

## 2021-02-17 LAB — CBC
HCT: 47.5 % — ABNORMAL HIGH (ref 36.0–46.0)
Hemoglobin: 15.3 g/dL — ABNORMAL HIGH (ref 12.0–15.0)
MCH: 30.2 pg (ref 26.0–34.0)
MCHC: 32.2 g/dL (ref 30.0–36.0)
MCV: 93.7 fL (ref 80.0–100.0)
Platelets: 356 10*3/uL (ref 150–400)
RBC: 5.07 MIL/uL (ref 3.87–5.11)
RDW: 13.4 % (ref 11.5–15.5)
WBC: 13.3 10*3/uL — ABNORMAL HIGH (ref 4.0–10.5)
nRBC: 0 % (ref 0.0–0.2)

## 2021-02-17 LAB — BLOOD GAS, ARTERIAL
Acid-base deficit: 0.8 mmol/L (ref 0.0–2.0)
Bicarbonate: 23.1 mmol/L (ref 20.0–28.0)
Drawn by: 602861
FIO2: 21
O2 Saturation: 97.2 %
Patient temperature: 37
pCO2 arterial: 36.8 mmHg (ref 32.0–48.0)
pH, Arterial: 7.415 (ref 7.350–7.450)
pO2, Arterial: 94.4 mmHg (ref 83.0–108.0)

## 2021-02-17 LAB — SARS CORONAVIRUS 2 (TAT 6-24 HRS): SARS Coronavirus 2: NEGATIVE

## 2021-02-17 LAB — URINALYSIS, ROUTINE W REFLEX MICROSCOPIC
Bacteria, UA: NONE SEEN
Bilirubin Urine: NEGATIVE
Glucose, UA: >=500 mg/dL — AB
Hgb urine dipstick: NEGATIVE
Ketones, ur: NEGATIVE mg/dL
Leukocytes,Ua: NEGATIVE
Nitrite: NEGATIVE
Protein, ur: NEGATIVE mg/dL
Specific Gravity, Urine: 1.028 (ref 1.005–1.030)
pH: 5 (ref 5.0–8.0)

## 2021-02-17 LAB — COMPREHENSIVE METABOLIC PANEL
ALT: 19 U/L (ref 0–44)
AST: 17 U/L (ref 15–41)
Albumin: 3.7 g/dL (ref 3.5–5.0)
Alkaline Phosphatase: 98 U/L (ref 38–126)
Anion gap: 12 (ref 5–15)
BUN: 12 mg/dL (ref 6–20)
CO2: 20 mmol/L — ABNORMAL LOW (ref 22–32)
Calcium: 9.1 mg/dL (ref 8.9–10.3)
Chloride: 104 mmol/L (ref 98–111)
Creatinine, Ser: 0.66 mg/dL (ref 0.44–1.00)
GFR, Estimated: >60 mL/min (ref >60)
Glucose, Bld: 124 mg/dL — ABNORMAL HIGH (ref 70–99)
Potassium: 4.3 mmol/L (ref 3.5–5.1)
Sodium: 136 mmol/L (ref 135–145)
Total Bilirubin: 0.6 mg/dL (ref 0.3–1.2)
Total Protein: 6.8 g/dL (ref 6.5–8.1)

## 2021-02-17 LAB — TYPE AND SCREEN
ABO/RH(D): B POS
Antibody Screen: NEGATIVE

## 2021-02-17 LAB — SURGICAL PCR SCREEN
MRSA, PCR: NEGATIVE
Staphylococcus aureus: NEGATIVE

## 2021-02-17 LAB — APTT: aPTT: 26 s (ref 24–36)

## 2021-02-17 LAB — BASIC METABOLIC PANEL
BUN: 13 mg/dL (ref 6–23)
CO2: 23 mEq/L (ref 19–32)
Calcium: 9.7 mg/dL (ref 8.4–10.5)
Chloride: 103 mEq/L (ref 96–112)
Creatinine, Ser: 0.62 mg/dL (ref 0.40–1.20)
GFR: 99.84 mL/min (ref >60.00)
Glucose, Bld: 110 mg/dL — ABNORMAL HIGH (ref 70–99)
Potassium: 4.3 mEq/L (ref 3.5–5.1)
Sodium: 138 mEq/L (ref 135–145)

## 2021-02-17 LAB — MICROALBUMIN / CREATININE URINE RATIO
Creatinine,U: 37.9 mg/dL
Microalb Creat Ratio: 2.2 mg/g (ref 0.0–30.0)
Microalb, Ur: 0.8 mg/dL (ref 0.0–1.9)

## 2021-02-17 LAB — GLUCOSE, CAPILLARY: Glucose-Capillary: 179 mg/dL — ABNORMAL HIGH (ref 70–99)

## 2021-02-17 LAB — PROTIME-INR
INR: 0.9 (ref 0.8–1.2)
Prothrombin Time: 12.3 s (ref 11.4–15.2)

## 2021-02-17 LAB — T4, FREE: Free T4: 1.01 ng/dL (ref 0.60–1.60)

## 2021-02-17 MED ORDER — NOVOLOG MIX 70/30 FLEXPEN (70-30) 100 UNIT/ML ~~LOC~~ SUPN: PEN_INJECTOR | SUBCUTANEOUS | 3 refills | Status: DC

## 2021-02-17 NOTE — Progress Notes (Signed)
PCP: Dr. Gerarda Fraction Cardiologist: Dr. Johnsie Cancel  EKG: today CXR: today ECHO: 2009 Stress Test: denies Cardiac Cath: denies  Fasting Blood Sugar- 130's Checks Blood Sugar twice a day  Had Covid test at [facility] today, aware to quarantine.   Patient denies shortness of breath, fever, cough, and chest pain at PAT appointment.  Patient verbalized understanding of instructions provided today at the PAT appointment.  Patient asked to review instructions at home and day of surgery.

## 2021-02-17 NOTE — Progress Notes (Signed)
Surgical Instructions    Your procedure is scheduled on Thursday, May 12th.  Report to [facility] Main Entrance "A" at 5:45 A.M., then check in with the Admitting office.  Call this number if you have problems the morning of surgery:  [phone]   If you have any questions prior to your surgery date call [phone]: Open Monday-Friday 8am-4pm   Remember:  Do not eat or drink after midnight the night before your surgery    Take these medicines the morning of surgery with A SIP OF WATER:   Atorvastatin (Lipitor)  Esomeprazole (Nexium)  Ezetimibe (Zetia)  Levothyroxine (Synthroid)   Albuterol inhaler - if needed  Albuterol nebulizer - if needed, please bring all inhalers with you to the hospital  Oxycodone - if needed   Per your Doctor, HOLD your Eliquis 2 days prior to surgery.  Follow your surgeon's instructions on when to stop Aspirin.  If no instructions were given by your surgeon then you will need to call the office to get those instructions.    As of today, STOP taking any Aspirin (unless otherwise instructed by your surgeon) Aleve, Naproxen, Ibuprofen, Motrin, Advil, Goody's, BC's, all herbal medications, fish oil, and all vitamins.   WHAT DO I DO ABOUT MY DIABETES MEDICATION?   . THE DAY BEFORE SURGERY (May 11), take your normal morning dose of Novolog 70/30 which is 20 units.  You will take a PARTIAL dose of your evening dose of Novolog 70/30 which is 20 units.   . THE DAY BEFORE SURGERY (May 11), do NOT take Jardiance      . THE MORNING OF SURGERY (May 12), DO NOT TAKE Novolog 70/30 . Do not take oral diabetes medicines (pills) the morning of surgery. NO Jardiance or Metformin  . The day of surgery, do not take other diabetes injectables, including Byetta (exenatide), Bydureon (exenatide ER), Victoza (liraglutide), or Trulicity (dulaglutide).     HOW TO MANAGE YOUR DIABETES BEFORE AND AFTER SURGERY  Why is it important to control my blood sugar before and  after surgery? . Improving blood sugar levels before and after surgery helps healing and can limit problems. . A way of improving blood sugar control is eating a healthy diet by: o  Eating less sugar and carbohydrates o  Increasing activity/exercise o  Talking with your doctor about reaching your blood sugar goals . High blood sugars (greater than 180 mg/dL) can raise your risk of infections and slow your recovery, so you will need to focus on controlling your diabetes during the weeks before surgery. . Make sure that the doctor who takes care of your diabetes knows about your planned surgery including the date and location.  How do I manage my blood sugar before surgery? . Check your blood sugar at least 4 times a day, starting 2 days before surgery, to make sure that the level is not too high or low. . Check your blood sugar the morning of your surgery when you wake up and every 2 hours until you get to the Short Stay unit. o If your blood sugar is less than 70 mg/dL, you will need to treat for low blood sugar: - Do not take insulin. - Treat a low blood sugar (less than 70 mg/dL) with  cup of clear juice (cranberry or apple), 4 glucose tablets, OR glucose gel. - Recheck blood sugar in 15 minutes after treatment (to make sure it is greater than 70 mg/dL). If your blood sugar is not greater than  70 mg/dL on recheck, call [phone] for further instructions. . Report your blood sugar to the short stay nurse when you get to Short Stay.  . If you are admitted to the hospital after surgery: o Your blood sugar will be checked by the staff and you will probably be given insulin after surgery (instead of oral diabetes medicines) to make sure you have good blood sugar levels. o The goal for blood sugar control after surgery is 80-180 mg/dL.                      DAY OF SURGERY:            Do not wear jewelry, make up, or nail polish            Do not wear lotions, powders, perfumes, or  deodorant.            Do not shave 48 hours prior to surgery.             Do not bring valuables to the hospital.            [facility] is not responsible for any belongings or valuables.  Do NOT Smoke (Tobacco/Vaping) or drink Alcohol 24 hours prior to your procedure If you use a CPAP at night, you may bring all equipment for your overnight stay.   Contacts, glasses, dentures or bridgework may not be worn into surgery, please bring cases for these belongings   For patients admitted to the hospital, discharge time will be determined by your treatment team.   Patients discharged the day of surgery will not be allowed to drive home, and someone needs to stay with them for 24 hours.    Special instructions:   [place]- Preparing For Surgery  Before surgery, you can play an important role. Because skin is not sterile, your skin needs to be as free of germs as possible. You can reduce the number of germs on your skin by washing with CHG (chlorahexidine gluconate) Soap before surgery.  CHG is an antiseptic cleaner which kills germs and bonds with the skin to continue killing germs even after washing.    Oral Hygiene is also important to reduce your risk of infection.  Remember - BRUSH YOUR TEETH THE MORNING OF SURGERY WITH YOUR REGULAR TOOTHPASTE  Please do not use if you have an allergy to CHG or antibacterial soaps. If your skin becomes reddened/irritated stop using the CHG.  Do not shave (including legs and underarms) for at least 48 hours prior to first CHG shower. It is OK to shave your face.  Please follow these instructions carefully.   1. Shower the NIGHT BEFORE SURGERY and the MORNING OF SURGERY  2. If you chose to wash your hair, wash your hair first as usual with your normal shampoo.  3. After you shampoo, rinse your hair and body thoroughly to remove the shampoo.  4. Wash Face and genitals (private parts) with your normal soap.   5.  Shower the NIGHT BEFORE SURGERY and  the MORNING OF SURGERY with CHG Soap.   6. Use CHG Soap as you would any other liquid soap. You can apply CHG directly to the skin and wash gently with a scrungie or a clean washcloth.   7. Apply the CHG Soap to your body ONLY FROM THE NECK DOWN.  Do not use on open wounds or open sores. Avoid contact with your eyes, ears, mouth and genitals (private parts). Wash  Face and genitals (private parts)  with your normal soap.   8. Wash thoroughly, paying special attention to the area where your surgery will be performed.  9. Thoroughly rinse your body with warm water from the neck down.  10. DO NOT shower/wash with your normal soap after using and rinsing off the CHG Soap.  11. Pat yourself dry with a CLEAN TOWEL.  12. Wear CLEAN PAJAMAS to bed the night before surgery  13. Place CLEAN SHEETS on your bed the night before your surgery  14. DO NOT SLEEP WITH PETS.   Day of Surgery: Take a shower with CHG soap. Wear Clean/Comfortable clothing the morning of surgery Do not apply any deodorants/lotions.   Remember to brush your teeth WITH YOUR REGULAR TOOTHPASTE.   Please read over the following fact sheets that you were given.

## 2021-02-18 NOTE — Anesthesia Preprocedure Evaluation (Addendum)
Anesthesia Evaluation  Patient identified by MRN, date of birth, ID band Patient awake    Reviewed: Allergy & Precautions, NPO status , Patient's Chart, lab work & pertinent test results  History of Anesthesia Complications Negative for: history of anesthetic complications  Airway Mallampati: I  TM Distance: >3 FB Neck ROM: Full    Dental  (+) Edentulous Upper, Partial Lower, Dental Advisory Given   Pulmonary COPD,  COPD inhaler, Current Smoker and Patient abstained from smoking.,  Adenocarcinoma lung   Pulmonary exam normal        Cardiovascular hypertension, Pt. on medications + Peripheral Vascular Disease  Normal cardiovascular exam     Neuro/Psych PSYCHIATRIC DISORDERS Anxiety negative neurological ROS     GI/Hepatic Neg liver ROS, GERD  ,  Endo/Other  diabetesHypothyroidism   Renal/GU negative Renal ROS     Musculoskeletal negative musculoskeletal ROS (+)   Abdominal   Peds  Hematology negative hematology ROS (+)   Anesthesia Other Findings   Reproductive/Obstetrics                           Anesthesia Physical Anesthesia Plan  ASA: III  Anesthesia Plan: General   Post-op Pain Management:    Induction: Intravenous  PONV Risk Score and Plan: 3 and Ondansetron, Dexamethasone and Midazolam  Airway Management Planned: Double Lumen EBT  Additional Equipment: Arterial line  Intra-op Plan:   Post-operative Plan: Possible Post-op intubation/ventilation  Informed Consent: I have reviewed the patients History and Physical, chart, labs and discussed the procedure including the risks, benefits and alternatives for the proposed anesthesia with the patient or authorized representative who has indicated his/her understanding and acceptance.     Dental advisory given  Plan Discussed with: Anesthesiologist and CRNA  Anesthesia Plan Comments: (PAT note by Karoline Caldwell,  PA-C: Current smoker with long history of tobacco abuse, at times smoking up to 3 packs/day.  History of left upper lobectomy in 2016 primary bronchogenic adenocarcinoma.  She now has a slow growing mixed density nodule in the right upper lobe concerning for new primary bronchogenic carcinoma.  History of TEVAR of thoracic aorta by Dr. Scot Dock 06/04/2019 after presenting with abdominal pain, CT showed ulcerated plaque in the descending thoracic aorta which embolized to kidney and spleen.  Thoracic stent graft done to prevent future embolization.  IDDM 2,  last A1c 7.7 on 02/16/2021.  Followed by endocrinologist Dr. Kelton Pillar  Preop Labs reviewed, unremarkable.  EKG 02/17/2021: Normal sinus rhythm.  Rate 94. Incomplete right bundle branch block. Left posterior fascicular block. Anterior infarct , age undetermined. No significant change since last tracing  PFT 12/16/2020:  Ref. Range 12/16/2020 09:49 FVC-%Pred-Pre Latest Units: % 73 FEV1-%Pred-Pre Latest Units: % 64 FEV1FVC-%Pred-Pre Latest Units: % 87 TLC % pred Latest Units: % 102 RV % pred Latest Units: % 134 DLCO unc % pred Latest Units: % 67  CT chest 11/26/2020: IMPRESSION: 1. Mixed solid and ground-glass nodule in the apical segment right upper lobe shows indolent growth from 02/06/2015 and is highly worrisome for adenocarcinoma. 2. Aortic atherosclerosis (ICD10-I70.0). Coronary artery calcification. 3.  Emphysema (ICD10-J43.9).  Exercise tolerance test 10/29/2019: Blood pressure demonstrated a hypertensive response to exercise. There was no ST segment deviation noted during stress. Duke treadmill score indicative of low risk of cardiac events.   )      Anesthesia Quick Evaluation

## 2021-02-18 NOTE — Progress Notes (Signed)
Anesthesia Chart Review:  Current smoker with long history of tobacco abuse, at times smoking up to 3 packs/day.  History of left upper lobectomy in 2016 primary bronchogenic adenocarcinoma.  She now has a slow growing mixed density nodule in the right upper lobe concerning for new primary bronchogenic carcinoma.  History of TEVAR of thoracic aorta by Dr. Scot Dock 06/04/2019 after presenting with abdominal pain, CT showed ulcerated plaque in the descending thoracic aorta which embolized to kidney and spleen.  Thoracic stent graft done to prevent future embolization.  IDDM 2,  last A1c 7.7 on 02/16/2021.  Followed by endocrinologist Dr. Kelton Pillar  Preop Labs reviewed, unremarkable.  EKG 02/17/2021: Normal sinus rhythm.  Rate 94. Incomplete right bundle branch block. Left posterior fascicular block. Anterior infarct , age undetermined. No significant change since last tracing  PFT 12/16/2020:  Ref. Range 12/16/2020 09:49  FVC-%Pred-Pre Latest Units: % 73  FEV1-%Pred-Pre Latest Units: % 64  FEV1FVC-%Pred-Pre Latest Units: % 87  TLC % pred Latest Units: % 102  RV % pred Latest Units: % 134  DLCO unc % pred Latest Units: % 67   CT chest 11/26/2020: IMPRESSION: 1. Mixed solid and ground-glass nodule in the apical segment right upper lobe shows indolent growth from 02/06/2015 and is highly worrisome for adenocarcinoma. 2. Aortic atherosclerosis (ICD10-I70.0). Coronary artery calcification. 3.  Emphysema (ICD10-J43.9).  Exercise tolerance test 10/29/2019:  Blood pressure demonstrated a hypertensive response to exercise.  There was no ST segment deviation noted during stress.  Duke treadmill score indicative of low risk of cardiac events.    See PAT note by Karoline Caldwell, PA-C

## 2021-02-19 ENCOUNTER — Inpatient Hospital Stay (HOSPITAL_COMMUNITY): Payer: Medicare HMO

## 2021-02-19 ENCOUNTER — Encounter (HOSPITAL_COMMUNITY): Payer: Self-pay | Admitting: Thoracic Surgery (Cardiothoracic Vascular Surgery)

## 2021-02-19 ENCOUNTER — Encounter (HOSPITAL_COMMUNITY)
Admission: RE | Disposition: A | Discharge status: Home / Self Care | Payer: Self-pay | Source: Home / Self Care | Attending: Thoracic Surgery (Cardiothoracic Vascular Surgery)

## 2021-02-19 ENCOUNTER — Inpatient Hospital Stay (HOSPITAL_COMMUNITY): Payer: Medicare HMO | Admitting: Anesthesiology

## 2021-02-19 ENCOUNTER — Inpatient Hospital Stay (HOSPITAL_COMMUNITY)
Admission: RE | Admit: 2021-02-19 | Discharge: 2021-02-23 | DRG: 164 | Disposition: A | Discharge status: Home / Self Care | Payer: Medicare HMO | Attending: Thoracic Surgery (Cardiothoracic Vascular Surgery) | Admitting: Thoracic Surgery (Cardiothoracic Vascular Surgery)

## 2021-02-19 ENCOUNTER — Inpatient Hospital Stay (HOSPITAL_COMMUNITY): Payer: Medicare HMO | Admitting: Physician Assistant

## 2021-02-19 ENCOUNTER — Other Ambulatory Visit: Payer: Self-pay

## 2021-02-19 DIAGNOSIS — Z79899 Other long term (current) drug therapy: Secondary | ICD-10-CM | POA: Diagnosis not present

## 2021-02-19 DIAGNOSIS — I2510 Atherosclerotic heart disease of native coronary artery without angina pectoris: Secondary | ICD-10-CM | POA: Diagnosis present

## 2021-02-19 DIAGNOSIS — I252 Old myocardial infarction: Secondary | ICD-10-CM | POA: Diagnosis not present

## 2021-02-19 DIAGNOSIS — K219 Gastro-esophageal reflux disease without esophagitis: Secondary | ICD-10-CM | POA: Diagnosis not present

## 2021-02-19 DIAGNOSIS — F17210 Nicotine dependence, cigarettes, uncomplicated: Secondary | ICD-10-CM | POA: Diagnosis present

## 2021-02-19 DIAGNOSIS — I700 Atherosclerosis of aorta: Secondary | ICD-10-CM | POA: Diagnosis present

## 2021-02-19 DIAGNOSIS — Z79890 Hormone replacement therapy (postmenopausal): Secondary | ICD-10-CM | POA: Diagnosis not present

## 2021-02-19 DIAGNOSIS — Z9889 Other specified postprocedural states: Secondary | ICD-10-CM | POA: Diagnosis not present

## 2021-02-19 DIAGNOSIS — Z20822 Contact with and (suspected) exposure to covid-19: Secondary | ICD-10-CM | POA: Diagnosis not present

## 2021-02-19 DIAGNOSIS — Z7982 Long term (current) use of aspirin: Secondary | ICD-10-CM | POA: Diagnosis not present

## 2021-02-19 DIAGNOSIS — C3411 Malignant neoplasm of upper lobe, right bronchus or lung: Principal | ICD-10-CM | POA: Diagnosis present

## 2021-02-19 DIAGNOSIS — Z902 Acquired absence of lung [part of]: Secondary | ICD-10-CM

## 2021-02-19 DIAGNOSIS — F419 Anxiety disorder, unspecified: Secondary | ICD-10-CM | POA: Diagnosis present

## 2021-02-19 DIAGNOSIS — Z419 Encounter for procedure for purposes other than remedying health state, unspecified: Secondary | ICD-10-CM

## 2021-02-19 DIAGNOSIS — E039 Hypothyroidism, unspecified: Secondary | ICD-10-CM | POA: Diagnosis present

## 2021-02-19 DIAGNOSIS — Z7901 Long term (current) use of anticoagulants: Secondary | ICD-10-CM | POA: Diagnosis not present

## 2021-02-19 DIAGNOSIS — Z800 Family history of malignant neoplasm of digestive organs: Secondary | ICD-10-CM

## 2021-02-19 DIAGNOSIS — R911 Solitary pulmonary nodule: Secondary | ICD-10-CM | POA: Diagnosis not present

## 2021-02-19 DIAGNOSIS — Z8041 Family history of malignant neoplasm of ovary: Secondary | ICD-10-CM

## 2021-02-19 DIAGNOSIS — Z4682 Encounter for fitting and adjustment of non-vascular catheter: Secondary | ICD-10-CM | POA: Diagnosis not present

## 2021-02-19 DIAGNOSIS — Z880 Allergy status to penicillin: Secondary | ICD-10-CM

## 2021-02-19 DIAGNOSIS — Z91030 Bee allergy status: Secondary | ICD-10-CM

## 2021-02-19 DIAGNOSIS — Z7984 Long term (current) use of oral hypoglycemic drugs: Secondary | ICD-10-CM

## 2021-02-19 DIAGNOSIS — J984 Other disorders of lung: Secondary | ICD-10-CM | POA: Diagnosis not present

## 2021-02-19 DIAGNOSIS — J449 Chronic obstructive pulmonary disease, unspecified: Secondary | ICD-10-CM | POA: Diagnosis not present

## 2021-02-19 DIAGNOSIS — Z801 Family history of malignant neoplasm of trachea, bronchus and lung: Secondary | ICD-10-CM

## 2021-02-19 DIAGNOSIS — E1165 Type 2 diabetes mellitus with hyperglycemia: Secondary | ICD-10-CM | POA: Diagnosis not present

## 2021-02-19 DIAGNOSIS — I10 Essential (primary) hypertension: Secondary | ICD-10-CM | POA: Diagnosis present

## 2021-02-19 DIAGNOSIS — Z8249 Family history of ischemic heart disease and other diseases of the circulatory system: Secondary | ICD-10-CM | POA: Diagnosis not present

## 2021-02-19 DIAGNOSIS — J9811 Atelectasis: Secondary | ICD-10-CM | POA: Diagnosis not present

## 2021-02-19 DIAGNOSIS — R69 Illness, unspecified: Secondary | ICD-10-CM | POA: Diagnosis not present

## 2021-02-19 DIAGNOSIS — R234 Changes in skin texture: Secondary | ICD-10-CM | POA: Diagnosis present

## 2021-02-19 DIAGNOSIS — J439 Emphysema, unspecified: Secondary | ICD-10-CM | POA: Diagnosis not present

## 2021-02-19 DIAGNOSIS — Z794 Long term (current) use of insulin: Secondary | ICD-10-CM | POA: Diagnosis not present

## 2021-02-19 DIAGNOSIS — J95812 Postprocedural air leak: Secondary | ICD-10-CM | POA: Diagnosis not present

## 2021-02-19 DIAGNOSIS — E119 Type 2 diabetes mellitus without complications: Secondary | ICD-10-CM | POA: Diagnosis present

## 2021-02-19 DIAGNOSIS — J939 Pneumothorax, unspecified: Secondary | ICD-10-CM

## 2021-02-19 HISTORY — PX: NODE DISSECTION: SHX5269

## 2021-02-19 HISTORY — PX: INTERCOSTAL NERVE BLOCK: SHX5021

## 2021-02-19 HISTORY — PX: VIDEO BRONCHOSCOPY WITH ENDOBRONCHIAL NAVIGATION: SHX6175

## 2021-02-19 LAB — GLUCOSE, CAPILLARY
Glucose-Capillary: 169 mg/dL — ABNORMAL HIGH (ref 70–99)
Glucose-Capillary: 164 mg/dL — ABNORMAL HIGH (ref 70–99)
Glucose-Capillary: 257 mg/dL — ABNORMAL HIGH (ref 70–99)
Glucose-Capillary: 175 mg/dL — ABNORMAL HIGH (ref 70–99)

## 2021-02-19 SURGERY — WEDGE RESECTION, LUNG, ROBOT-ASSISTED, THORACOSCOPIC
Anesthesia: General | Site: Chest | Laterality: Right

## 2021-02-19 MED ORDER — PHENYLEPHRINE 40 MCG/ML (10ML) SYRINGE FOR IV PUSH (FOR BLOOD PRESSURE SUPPORT)
PREFILLED_SYRINGE | INTRAVENOUS | Status: AC
  Filled 2021-02-19: qty 10

## 2021-02-19 MED ORDER — CHLORHEXIDINE GLUCONATE 0.12 % MT SOLN
OROMUCOSAL | Status: AC
  Administered 2021-02-19: 15 mL via OROMUCOSAL
  Filled 2021-02-19: qty 15

## 2021-02-19 MED ORDER — PHENYLEPHRINE HCL-NACL 10-0.9 MG/250ML-% IV SOLN
INTRAVENOUS | Status: DC | PRN
  Administered 2021-02-19: 40 ug/min via INTRAVENOUS

## 2021-02-19 MED ORDER — BUPIVACAINE LIPOSOME 1.3 % IJ SUSP
INTRAMUSCULAR | Status: AC
  Filled 2021-02-19: qty 20

## 2021-02-19 MED ORDER — TRAMADOL HCL 50 MG PO TABS
50.0000 mg | ORAL_TABLET | Freq: Four times a day (QID) | ORAL | Status: DC | PRN
  Administered 2021-02-19 – 2021-02-23 (×8): 100 mg via ORAL
  Filled 2021-02-19 (×8): qty 2

## 2021-02-19 MED ORDER — ACETAMINOPHEN 500 MG PO TABS
1000.0000 mg | ORAL_TABLET | Freq: Four times a day (QID) | ORAL | Status: DC
  Administered 2021-02-19 – 2021-02-23 (×14): 1000 mg via ORAL
  Filled 2021-02-19 (×14): qty 2

## 2021-02-19 MED ORDER — PROPOFOL 10 MG/ML IV BOLUS
INTRAVENOUS | Status: DC | PRN
  Administered 2021-02-19: 150 mg via INTRAVENOUS
  Administered 2021-02-19: 20 mg via INTRAVENOUS

## 2021-02-19 MED ORDER — FENTANYL CITRATE (PF) 100 MCG/2ML IJ SOLN: 25.0000 ug | INTRAMUSCULAR | Status: DC | PRN

## 2021-02-19 MED ORDER — CELECOXIB 200 MG PO CAPS
200.0000 mg | ORAL_CAPSULE | Freq: Once | ORAL | Status: AC
  Administered 2021-02-19: 200 mg via ORAL
  Filled 2021-02-19: qty 1

## 2021-02-19 MED ORDER — SUGAMMADEX SODIUM 200 MG/2ML IV SOLN
INTRAVENOUS | Status: DC | PRN
  Administered 2021-02-19: 200 mg via INTRAVENOUS

## 2021-02-19 MED ORDER — LIDOCAINE 2% (20 MG/ML) 5 ML SYRINGE
INTRAMUSCULAR | Status: DC | PRN
  Administered 2021-02-19: 100 mg via INTRAVENOUS

## 2021-02-19 MED ORDER — FENTANYL CITRATE (PF) 250 MCG/5ML IJ SOLN
INTRAMUSCULAR | Status: AC
  Filled 2021-02-19: qty 5

## 2021-02-19 MED ORDER — ESMOLOL HCL 100 MG/10ML IV SOLN
INTRAVENOUS | Status: DC | PRN
  Administered 2021-02-19: 30 mg via INTRAVENOUS

## 2021-02-19 MED ORDER — MIDAZOLAM HCL 2 MG/2ML IJ SOLN
INTRAMUSCULAR | Status: DC | PRN
  Administered 2021-02-19: 2 mg via INTRAVENOUS

## 2021-02-19 MED ORDER — ONDANSETRON HCL 4 MG/2ML IJ SOLN
INTRAMUSCULAR | Status: DC | PRN
  Administered 2021-02-19: 4 mg via INTRAVENOUS

## 2021-02-19 MED ORDER — HEMOSTATIC AGENTS (NO CHARGE) OPTIME
TOPICAL | Status: DC | PRN
  Administered 2021-02-19: 1 via TOPICAL

## 2021-02-19 MED ORDER — PHENYLEPHRINE HCL (PRESSORS) 10 MG/ML IV SOLN
INTRAVENOUS | Status: AC
  Filled 2021-02-19: qty 1

## 2021-02-19 MED ORDER — INDOCYANINE GREEN 25 MG IV SOLR
INTRAVENOUS | Status: AC
  Filled 2021-02-19: qty 10

## 2021-02-19 MED ORDER — LIDOCAINE 2% (20 MG/ML) 5 ML SYRINGE
INTRAMUSCULAR | Status: AC
  Filled 2021-02-19: qty 5

## 2021-02-19 MED ORDER — ONDANSETRON HCL 4 MG/2ML IJ SOLN
INTRAMUSCULAR | Status: AC
  Filled 2021-02-19: qty 2

## 2021-02-19 MED ORDER — ACETAMINOPHEN 160 MG/5ML PO SOLN: 1000.0000 mg | Freq: Four times a day (QID) | ORAL | Status: DC

## 2021-02-19 MED ORDER — EZETIMIBE 10 MG PO TABS
10.0000 mg | ORAL_TABLET | Freq: Every day | ORAL | Status: DC
  Administered 2021-02-20 – 2021-02-23 (×4): 10 mg via ORAL
  Filled 2021-02-19 (×4): qty 1

## 2021-02-19 MED ORDER — ORAL CARE MOUTH RINSE: 15.0000 mL | Freq: Once | OROMUCOSAL | Status: AC

## 2021-02-19 MED ORDER — SODIUM CHLORIDE 0.9 % IV SOLN
INTRAVENOUS | Status: AC | PRN
  Administered 2021-02-19: 1000 mL via INTRAMUSCULAR

## 2021-02-19 MED ORDER — LEVALBUTEROL HCL 0.63 MG/3ML IN NEBU
0.6300 mg | INHALATION_SOLUTION | Freq: Four times a day (QID) | RESPIRATORY_TRACT | Status: DC | PRN
  Administered 2021-02-20: 0.63 mg via RESPIRATORY_TRACT
  Filled 2021-02-19: qty 3

## 2021-02-19 MED ORDER — PHENYLEPHRINE 40 MCG/ML (10ML) SYRINGE FOR IV PUSH (FOR BLOOD PRESSURE SUPPORT)
PREFILLED_SYRINGE | INTRAVENOUS | Status: DC | PRN
  Administered 2021-02-19: 120 ug via INTRAVENOUS
  Administered 2021-02-19: 80 ug via INTRAVENOUS

## 2021-02-19 MED ORDER — KETOROLAC TROMETHAMINE 15 MG/ML IJ SOLN
15.0000 mg | Freq: Four times a day (QID) | INTRAMUSCULAR | Status: DC
  Administered 2021-02-19 – 2021-02-21 (×8): 15 mg via INTRAVENOUS
  Filled 2021-02-19 (×8): qty 1

## 2021-02-19 MED ORDER — ATORVASTATIN CALCIUM 80 MG PO TABS
80.0000 mg | ORAL_TABLET | Freq: Every day | ORAL | Status: DC
  Administered 2021-02-20 – 2021-02-22 (×3): 80 mg via ORAL
  Filled 2021-02-19 (×3): qty 1

## 2021-02-19 MED ORDER — LATANOPROST 0.005 % OP SOLN
1.0000 [drp] | Freq: Every day | OPHTHALMIC | Status: DC
  Administered 2021-02-19 – 2021-02-22 (×4): 1 [drp] via OPHTHALMIC
  Filled 2021-02-19: qty 2.5

## 2021-02-19 MED ORDER — LEVOTHYROXINE SODIUM 50 MCG PO TABS
50.0000 ug | ORAL_TABLET | Freq: Every day | ORAL | Status: DC
  Administered 2021-02-20 – 2021-02-23 (×4): 50 ug via ORAL
  Filled 2021-02-19 (×4): qty 1

## 2021-02-19 MED ORDER — METHYLENE BLUE 0.5 % INJ SOLN
INTRAVENOUS | Status: AC
  Filled 2021-02-19: qty 10

## 2021-02-19 MED ORDER — DEXAMETHASONE SODIUM PHOSPHATE 10 MG/ML IJ SOLN
INTRAMUSCULAR | Status: DC | PRN
  Administered 2021-02-19: 8 mg via INTRAVENOUS

## 2021-02-19 MED ORDER — BUPIVACAINE LIPOSOME 1.3 % IJ SUSP
INTRAMUSCULAR | Status: DC | PRN
  Administered 2021-02-19: 90 mL

## 2021-02-19 MED ORDER — SENNOSIDES-DOCUSATE SODIUM 8.6-50 MG PO TABS
1.0000 | ORAL_TABLET | Freq: Every day | ORAL | Status: DC
  Administered 2021-02-19 – 2021-02-22 (×4): 1 via ORAL
  Filled 2021-02-19 (×4): qty 1

## 2021-02-19 MED ORDER — MIDAZOLAM HCL 2 MG/2ML IJ SOLN
INTRAMUSCULAR | Status: AC
  Filled 2021-02-19: qty 2

## 2021-02-19 MED ORDER — 0.9 % SODIUM CHLORIDE (POUR BTL) OPTIME
TOPICAL | Status: DC | PRN
  Administered 2021-02-19 (×2): 1000 mL

## 2021-02-19 MED ORDER — ASPIRIN EC 81 MG PO TBEC
81.0000 mg | DELAYED_RELEASE_TABLET | Freq: Every day | ORAL | Status: DC
  Administered 2021-02-20 – 2021-02-23 (×4): 81 mg via ORAL
  Filled 2021-02-19 (×4): qty 1

## 2021-02-19 MED ORDER — LACTATED RINGERS IV SOLN: INTRAVENOUS | Status: DC | PRN

## 2021-02-19 MED ORDER — ROCURONIUM BROMIDE 10 MG/ML (PF) SYRINGE
PREFILLED_SYRINGE | INTRAVENOUS | Status: AC
  Filled 2021-02-19: qty 10

## 2021-02-19 MED ORDER — ACETAMINOPHEN 500 MG PO TABS: 1000.0000 mg | ORAL_TABLET | Freq: Once | ORAL | Status: AC

## 2021-02-19 MED ORDER — ALBUTEROL SULFATE HFA 108 (90 BASE) MCG/ACT IN AERS
INHALATION_SPRAY | RESPIRATORY_TRACT | Status: DC | PRN
  Administered 2021-02-19: 4 via RESPIRATORY_TRACT

## 2021-02-19 MED ORDER — PROPOFOL 10 MG/ML IV BOLUS
INTRAVENOUS | Status: AC
  Filled 2021-02-19: qty 40

## 2021-02-19 MED ORDER — FENTANYL CITRATE (PF) 250 MCG/5ML IJ SOLN
INTRAMUSCULAR | Status: DC | PRN
  Administered 2021-02-19: 50 ug via INTRAVENOUS
  Administered 2021-02-19: 100 ug via INTRAVENOUS
  Administered 2021-02-19: 50 ug via INTRAVENOUS
  Administered 2021-02-19: 150 ug via INTRAVENOUS

## 2021-02-19 MED ORDER — GABAPENTIN 300 MG PO CAPS
ORAL_CAPSULE | ORAL | Status: AC
  Filled 2021-02-19: qty 1

## 2021-02-19 MED ORDER — VANCOMYCIN HCL IN DEXTROSE 1-5 GM/200ML-% IV SOLN
INTRAVENOUS | Status: AC
  Administered 2021-02-19: 1000 mg via INTRAVENOUS
  Filled 2021-02-19: qty 200

## 2021-02-19 MED ORDER — ALBUMIN HUMAN 5 % IV SOLN
12.5000 g | Freq: Once | INTRAVENOUS | Status: AC
  Administered 2021-02-19: 12.5 g via INTRAVENOUS

## 2021-02-19 MED ORDER — CHLORHEXIDINE GLUCONATE 0.12 % MT SOLN: 15.0000 mL | Freq: Once | OROMUCOSAL | Status: AC

## 2021-02-19 MED ORDER — PANTOPRAZOLE SODIUM 40 MG PO TBEC
40.0000 mg | DELAYED_RELEASE_TABLET | Freq: Every day | ORAL | Status: DC
  Administered 2021-02-20 – 2021-02-23 (×4): 40 mg via ORAL
  Filled 2021-02-19 (×4): qty 1

## 2021-02-19 MED ORDER — OXYCODONE HCL 5 MG PO TABS
5.0000 mg | ORAL_TABLET | ORAL | Status: DC | PRN
  Administered 2021-02-20 – 2021-02-22 (×5): 10 mg via ORAL
  Filled 2021-02-19 (×5): qty 2

## 2021-02-19 MED ORDER — DEXAMETHASONE SODIUM PHOSPHATE 10 MG/ML IJ SOLN
INTRAMUSCULAR | Status: AC
  Filled 2021-02-19: qty 1

## 2021-02-19 MED ORDER — ACETAMINOPHEN 500 MG PO TABS
ORAL_TABLET | ORAL | Status: AC
  Administered 2021-02-19: 1000 mg via ORAL
  Filled 2021-02-19: qty 2

## 2021-02-19 MED ORDER — GLYCOPYRROLATE 0.2 MG/ML IJ SOLN
INTRAMUSCULAR | Status: DC | PRN
  Administered 2021-02-19: .2 mg via INTRAVENOUS

## 2021-02-19 MED ORDER — ALPRAZOLAM 0.5 MG PO TABS
0.5000 mg | ORAL_TABLET | Freq: Every day | ORAL | Status: DC
  Administered 2021-02-20 – 2021-02-22 (×3): 0.5 mg via ORAL
  Filled 2021-02-19 (×3): qty 1

## 2021-02-19 MED ORDER — ENOXAPARIN SODIUM 40 MG/0.4ML IJ SOSY
40.0000 mg | PREFILLED_SYRINGE | Freq: Every day | INTRAMUSCULAR | Status: DC
  Administered 2021-02-19 – 2021-02-22 (×4): 40 mg via SUBCUTANEOUS
  Filled 2021-02-19 (×4): qty 0.4

## 2021-02-19 MED ORDER — SODIUM CHLORIDE 0.9 % IV SOLN: INTRAVENOUS | Status: DC

## 2021-02-19 MED ORDER — ALBUMIN HUMAN 5 % IV SOLN: INTRAVENOUS | Status: DC | PRN

## 2021-02-19 MED ORDER — LACTATED RINGERS IV SOLN: INTRAVENOUS | Status: DC

## 2021-02-19 MED ORDER — METHYLENE BLUE 0.5 % INJ SOLN
INTRAVENOUS | Status: DC | PRN
  Administered 2021-02-19: .5 mL

## 2021-02-19 MED ORDER — SCOPOLAMINE 1 MG/3DAYS TD PT72
MEDICATED_PATCH | TRANSDERMAL | Status: AC
  Administered 2021-02-19: 1.5 mg via TRANSDERMAL
  Filled 2021-02-19: qty 1

## 2021-02-19 MED ORDER — INSULIN ASPART 100 UNIT/ML IJ SOLN
0.0000 [IU] | Freq: Three times a day (TID) | INTRAMUSCULAR | Status: DC
  Administered 2021-02-19: 12 [IU] via SUBCUTANEOUS
  Administered 2021-02-19 – 2021-02-20 (×3): 4 [IU] via SUBCUTANEOUS
  Administered 2021-02-20 (×2): 2 [IU] via SUBCUTANEOUS
  Administered 2021-02-21: 4 [IU] via SUBCUTANEOUS
  Administered 2021-02-21 (×2): 2 [IU] via SUBCUTANEOUS
  Administered 2021-02-21 – 2021-02-22 (×2): 4 [IU] via SUBCUTANEOUS
  Administered 2021-02-22: 8 [IU] via SUBCUTANEOUS
  Administered 2021-02-22: 4 [IU] via SUBCUTANEOUS
  Administered 2021-02-22: 2 [IU] via SUBCUTANEOUS
  Administered 2021-02-23: 4 [IU] via SUBCUTANEOUS

## 2021-02-19 MED ORDER — GLYCOPYRROLATE PF 0.2 MG/ML IJ SOSY
PREFILLED_SYRINGE | INTRAMUSCULAR | Status: AC
  Filled 2021-02-19: qty 1

## 2021-02-19 MED ORDER — ALBUMIN HUMAN 5 % IV SOLN
INTRAVENOUS | Status: AC
  Filled 2021-02-19: qty 250

## 2021-02-19 MED ORDER — SCOPOLAMINE 1 MG/3DAYS TD PT72: 1.0000 | MEDICATED_PATCH | TRANSDERMAL | Status: DC

## 2021-02-19 MED ORDER — VANCOMYCIN HCL 1000 MG/200ML IV SOLN
1000.0000 mg | Freq: Two times a day (BID) | INTRAVENOUS | Status: AC
  Administered 2021-02-19: 1000 mg via INTRAVENOUS
  Filled 2021-02-19: qty 200

## 2021-02-19 MED ORDER — ZOLPIDEM TARTRATE 5 MG PO TABS
5.0000 mg | ORAL_TABLET | Freq: Every day | ORAL | Status: DC
  Administered 2021-02-20 – 2021-02-22 (×3): 5 mg via ORAL
  Filled 2021-02-19 (×3): qty 1

## 2021-02-19 MED ORDER — ROCURONIUM BROMIDE 10 MG/ML (PF) SYRINGE
PREFILLED_SYRINGE | INTRAVENOUS | Status: DC | PRN
  Administered 2021-02-19: 100 mg via INTRAVENOUS
  Administered 2021-02-19: 50 mg via INTRAVENOUS
  Administered 2021-02-19: 20 mg via INTRAVENOUS

## 2021-02-19 MED ORDER — ONDANSETRON HCL 4 MG/2ML IJ SOLN: 4.0000 mg | Freq: Four times a day (QID) | INTRAMUSCULAR | Status: DC | PRN

## 2021-02-19 MED ORDER — BISACODYL 5 MG PO TBEC
10.0000 mg | DELAYED_RELEASE_TABLET | Freq: Every day | ORAL | Status: DC
  Administered 2021-02-20 – 2021-02-23 (×2): 10 mg via ORAL
  Filled 2021-02-19 (×2): qty 2

## 2021-02-19 MED ORDER — BUPIVACAINE HCL (PF) 0.5 % IJ SOLN
INTRAMUSCULAR | Status: AC
  Filled 2021-02-19: qty 30

## 2021-02-19 MED ORDER — LUNG SURGERY BOOK
Freq: Once | Status: DC
  Filled 2021-02-19: qty 1

## 2021-02-19 MED ORDER — VANCOMYCIN HCL IN DEXTROSE 1-5 GM/200ML-% IV SOLN: 1000.0000 mg | INTRAVENOUS | Status: AC

## 2021-02-19 SURGICAL SUPPLY — 135 items
ADAPTER BRONCHOSCOPE OLYMPUS (ADAPTER) ×3 IMPLANT
ADAPTER VALVE BIOPSY EBUS (MISCELLANEOUS) IMPLANT
ADPTR VALVE BIOPSY EBUS (MISCELLANEOUS)
APPLIER CLIP ROT 10 11.4 M/L (STAPLE)
BLADE CLIPPER SURG (BLADE) IMPLANT
BNDG COHESIVE 6X5 TAN STRL LF (GAUZE/BANDAGES/DRESSINGS) IMPLANT
BRUSH BIOPSY BRONCH 10 SDTNB (MISCELLANEOUS) IMPLANT
BRUSH SUPERTRAX BIOPSY (INSTRUMENTS) IMPLANT
BRUSH SUPERTRAX NDL-TIP CYTO (INSTRUMENTS) ×3 IMPLANT
CANISTER SUCT 3000ML PPV (MISCELLANEOUS) ×3 IMPLANT
CANNULA REDUC XI 12-8 STAPL (CANNULA) ×6
CANNULA REDUCER 12-8 DVNC XI (CANNULA) ×4 IMPLANT
CATH THORACIC 28FR (CATHETERS) IMPLANT
CATH THORACIC 28FR RT ANG (CATHETERS) IMPLANT
CATH THORACIC 36FR (CATHETERS) IMPLANT
CATH THORACIC 36FR RT ANG (CATHETERS) IMPLANT
CLIP APPLIE ROT 10 11.4 M/L (STAPLE) IMPLANT
CLIP VESOCCLUDE MED 6/CT (CLIP) ×3 IMPLANT
CNTNR URN SCR LID CUP LEK RST (MISCELLANEOUS) ×16 IMPLANT
CONN ST 1/4X3/8  BEN (MISCELLANEOUS) ×3
CONN ST 1/4X3/8 BEN (MISCELLANEOUS) ×2 IMPLANT
CONT SPEC 4OZ STRL OR WHT (MISCELLANEOUS) ×24
COVER BACK TABLE 60X90IN (DRAPES) IMPLANT
DEFOGGER SCOPE WARMER CLEARIFY (MISCELLANEOUS) ×3 IMPLANT
DERMABOND ADVANCED (GAUZE/BANDAGES/DRESSINGS) ×1
DERMABOND ADVANCED .7 DNX12 (GAUZE/BANDAGES/DRESSINGS) ×2 IMPLANT
DRAIN CHANNEL 28F RND 3/8 FF (WOUND CARE) ×3 IMPLANT
DRAIN CHANNEL 32F RND 10.7 FF (WOUND CARE) IMPLANT
DRAPE ARM DVNC X/XI (DISPOSABLE) ×8 IMPLANT
DRAPE COLUMN DVNC XI (DISPOSABLE) ×2 IMPLANT
DRAPE CV SPLIT W-CLR ANES SCRN (DRAPES) ×3 IMPLANT
DRAPE DA VINCI XI ARM (DISPOSABLE) ×12
DRAPE DA VINCI XI COLUMN (DISPOSABLE) ×3
DRAPE HALF SHEET 40X57 (DRAPES) ×6 IMPLANT
DRAPE INCISE IOBAN 66X45 STRL (DRAPES) IMPLANT
DRAPE ORTHO SPLIT 77X108 STRL (DRAPES) ×3
DRAPE SURG ORHT 6 SPLT 77X108 (DRAPES) ×2 IMPLANT
ELECT BLADE 6.5 EXT (BLADE) ×3 IMPLANT
ELECT REM PT RETURN 9FT ADLT (ELECTROSURGICAL) ×3
ELECTRODE REM PT RTRN 9FT ADLT (ELECTROSURGICAL) ×2 IMPLANT
FILTER STRAW FLUID ASPIR (MISCELLANEOUS) ×3 IMPLANT
FORCEPS BIOP SUPERTRX PREMAR (INSTRUMENTS) IMPLANT
GAUZE KITTNER 4X5 RF (MISCELLANEOUS) IMPLANT
GAUZE SPONGE 4X4 12PLY STRL (GAUZE/BANDAGES/DRESSINGS) ×3 IMPLANT
GLOVE SURG SIGNA 7.5 PF LTX (GLOVE) ×3 IMPLANT
GLOVE TRIUMPH SURG SIZE 7.5 (KITS) ×6 IMPLANT
GOWN STRL REUS W/ TWL LRG LVL3 (GOWN DISPOSABLE) ×8 IMPLANT
GOWN STRL REUS W/ TWL XL LVL3 (GOWN DISPOSABLE) ×6 IMPLANT
GOWN STRL REUS W/TWL 2XL LVL3 (GOWN DISPOSABLE) ×3 IMPLANT
GOWN STRL REUS W/TWL LRG LVL3 (GOWN DISPOSABLE) ×12
GOWN STRL REUS W/TWL XL LVL3 (GOWN DISPOSABLE) ×9
HEMOSTAT SURGICEL 2X14 (HEMOSTASIS) ×6 IMPLANT
IRRIGATION STRYKERFLOW (MISCELLANEOUS) ×2 IMPLANT
IRRIGATOR STRYKERFLOW (MISCELLANEOUS) ×3
KIT BASIN OR (CUSTOM PROCEDURE TRAY) ×3 IMPLANT
KIT CLEAN ENDO COMPLIANCE (KITS) ×3 IMPLANT
KIT ILLUMISITE 180 PROCEDURE (KITS) ×3 IMPLANT
KIT SUCTION CATH 14FR (SUCTIONS) IMPLANT
KIT TURNOVER KIT B (KITS) ×3 IMPLANT
LOOP VESSEL SUPERMAXI WHITE (MISCELLANEOUS) IMPLANT
MARKER SKIN DUAL TIP RULER LAB (MISCELLANEOUS) ×3 IMPLANT
NEEDLE HYPO 25GX1X1/2 BEV (NEEDLE) ×3 IMPLANT
NEEDLE SPNL 22GX3.5 QUINCKE BK (NEEDLE) ×3 IMPLANT
NEEDLE SUPERTRX PREMARK BIOPSY (NEEDLE) ×3 IMPLANT
NS IRRIG 1000ML POUR BTL (IV SOLUTION) ×3 IMPLANT
OBTURATOR OPTICAL STANDARD 8MM (TROCAR)
OBTURATOR OPTICAL STND 8 DVNC (TROCAR)
OBTURATOR OPTICALSTD 8 DVNC (TROCAR) IMPLANT
OIL SILICONE PENTAX (PARTS (SERVICE/REPAIRS)) ×3 IMPLANT
PACK CHEST (CUSTOM PROCEDURE TRAY) ×3 IMPLANT
PAD ARMBOARD 7.5X6 YLW CONV (MISCELLANEOUS) ×6 IMPLANT
PATCHES PATIENT (LABEL) ×9 IMPLANT
RELOAD STAPLER 3.5X45 BLU DVNC (STAPLE) ×4 IMPLANT
RELOAD STAPLER 4.3X45 GRN DVNC (STAPLE) ×6 IMPLANT
RELOAD STAPLER 45 4.6 BLK DVNC (STAPLE) ×2 IMPLANT
SCISSORS LAP 5X35 DISP (ENDOMECHANICALS) IMPLANT
SEAL CANN UNIV 5-8 DVNC XI (MISCELLANEOUS) ×4 IMPLANT
SEAL XI 5MM-8MM UNIVERSAL (MISCELLANEOUS) ×6
SEALANT PROGEL (MISCELLANEOUS) IMPLANT
SEALANT SURG COSEAL 4ML (VASCULAR PRODUCTS) IMPLANT
SEALANT SURG COSEAL 8ML (VASCULAR PRODUCTS) IMPLANT
SET TUBE SMOKE EVAC HIGH FLOW (TUBING) IMPLANT
SOLUTION ELECTROLUBE (MISCELLANEOUS) ×3 IMPLANT
SPONGE INTESTINAL PEANUT (DISPOSABLE) IMPLANT
SPONGE TONSIL TAPE 1 RFD (DISPOSABLE) IMPLANT
STAPLER 45 DA VINCI SURE FORM (STAPLE) ×3
STAPLER 45 SUREFORM DVNC (STAPLE) ×2 IMPLANT
STAPLER CANNULA SEAL DVNC XI (STAPLE) ×4 IMPLANT
STAPLER CANNULA SEAL XI (STAPLE) ×6
STAPLER RELOAD 3.5X45 BLU DVNC (STAPLE) ×4
STAPLER RELOAD 3.5X45 BLUE (STAPLE) ×6
STAPLER RELOAD 4.3X45 GREEN (STAPLE) ×9
STAPLER RELOAD 4.3X45 GRN DVNC (STAPLE) ×6
STAPLER RELOAD 45 4.6 BLK (STAPLE) ×3
STAPLER RELOAD 45 4.6 BLK DVNC (STAPLE) ×2
SUT PDS AB 3-0 SH 27 (SUTURE) IMPLANT
SUT PROLENE 4 0 RB 1 (SUTURE)
SUT PROLENE 4-0 RB1 .5 CRCL 36 (SUTURE) IMPLANT
SUT SILK  1 MH (SUTURE) ×3
SUT SILK 1 MH (SUTURE) ×2 IMPLANT
SUT SILK 1 TIES 10X30 (SUTURE) IMPLANT
SUT SILK 2 0 SH (SUTURE) ×3 IMPLANT
SUT SILK 2 0SH CR/8 30 (SUTURE) IMPLANT
SUT SILK 3 0SH CR/8 30 (SUTURE) IMPLANT
SUT VIC AB 1 CTX 36 (SUTURE) ×3
SUT VIC AB 1 CTX36XBRD ANBCTR (SUTURE) ×2 IMPLANT
SUT VIC AB 2-0 CTX 36 (SUTURE) ×3 IMPLANT
SUT VIC AB 3-0 MH 27 (SUTURE) IMPLANT
SUT VIC AB 3-0 X1 27 (SUTURE) ×6 IMPLANT
SUT VICRYL 0 TIES 12 18 (SUTURE) ×3 IMPLANT
SUT VICRYL 0 UR6 27IN ABS (SUTURE) ×6 IMPLANT
SUT VICRYL 2 TP 1 (SUTURE) IMPLANT
SYR 20ML ECCENTRIC (SYRINGE) ×3 IMPLANT
SYR 20ML LL LF (SYRINGE) ×9 IMPLANT
SYR 30ML LL (SYRINGE) ×3 IMPLANT
SYR 5ML LL (SYRINGE) ×3 IMPLANT
SYR TB 1ML LUER SLIP (SYRINGE) IMPLANT
SYSTEM RETRIEVAL ANCHOR 12 (MISCELLANEOUS) ×3 IMPLANT
SYSTEM SAHARA CHEST DRAIN ATS (WOUND CARE) ×3 IMPLANT
TAPE CLOTH 4X10 WHT NS (GAUZE/BANDAGES/DRESSINGS) ×3 IMPLANT
TAPE CLOTH SURG 4X10 WHT LF (GAUZE/BANDAGES/DRESSINGS) ×3 IMPLANT
TIP APPLICATOR SPRAY EXTEND 16 (VASCULAR PRODUCTS) IMPLANT
TOWEL GREEN STERILE (TOWEL DISPOSABLE) ×9 IMPLANT
TOWEL GREEN STERILE FF (TOWEL DISPOSABLE) ×3 IMPLANT
TRAP SPECIMEN MUCUS 40CC (MISCELLANEOUS) IMPLANT
TRAY FOLEY MTR SLVR 16FR STAT (SET/KITS/TRAYS/PACK) ×3 IMPLANT
TROCAR BLADELESS 15MM (ENDOMECHANICALS) IMPLANT
TROCAR XCEL 12X100 BLDLESS (ENDOMECHANICALS) ×3 IMPLANT
TROCAR XCEL BLADELESS 5X75MML (TROCAR) IMPLANT
TUBE CONNECTING 20X1/4 (TUBING) ×6 IMPLANT
UNDERPAD 30X36 HEAVY ABSORB (UNDERPADS AND DIAPERS) ×3 IMPLANT
VALVE BIOPSY  SINGLE USE (MISCELLANEOUS) ×6
VALVE BIOPSY SINGLE USE (MISCELLANEOUS) ×4 IMPLANT
VALVE SUCTION BRONCHIO DISP (MISCELLANEOUS) ×6 IMPLANT
WATER STERILE IRR 1000ML POUR (IV SOLUTION) ×3 IMPLANT

## 2021-02-19 NOTE — Anesthesia Procedure Notes (Signed)
Procedure Name: Intubation Date/Time: 02/19/2021 8:17 AM Performed by: Betha Loa, CRNA Pre-anesthesia Checklist: Patient identified, Emergency Drugs available, Suction available and Patient being monitored Patient Re-evaluated:Patient Re-evaluated prior to induction Oxygen Delivery Method: Circle System Utilized Preoxygenation: Pre-oxygenation with 100% oxygen Induction Type: IV induction Ventilation: Mask ventilation without difficulty Laryngoscope Size: Mac and 3 Grade View: Grade I Tube type: Oral Tube size: 8.5 mm Number of attempts: 1 Airway Equipment and Method: Stylet and Oral airway Placement Confirmation: ETT inserted through vocal cords under direct vision,  positive ETCO2 and breath sounds checked- equal and bilateral Secured at: 23 cm Tube secured with: Tape Dental Injury: Teeth and Oropharynx as per pre-operative assessment

## 2021-02-19 NOTE — Anesthesia Procedure Notes (Signed)
Arterial Line Insertion Start/End5/09/2021 6:45 AM, 02/19/2021 6:50 AM Performed by: Betha Loa, CRNA, CRNA  Patient location: Pre-op. Preanesthetic checklist: patient identified, IV checked, site marked, risks and benefits discussed, surgical consent, monitors and equipment checked, pre-op evaluation, timeout performed and anesthesia consent Lidocaine 1% used for infiltration Left, radial was placed Catheter size: 20 G Hand hygiene performed  and maximum sterile barriers used   Attempts: 1 Procedure performed without using ultrasound guided technique. Following insertion, dressing applied and Biopatch. Post procedure assessment: normal  Patient tolerated the procedure well with no immediate complications.

## 2021-02-19 NOTE — Brief Op Note (Addendum)
02/19/2021  11:26 AM  PATIENT:  Marissa [name]  55 y.o. female  PRE-OPERATIVE DIAGNOSIS:  RIGHT UPPER LOBE LUNG NODULE  POST-OPERATIVE DIAGNOSIS:  NON-SMALL CELL CARCINOMA RIGHT UPPER LOBE- CLINCAL STAGE IA(T1N0)  PROCEDURE:  Procedure(s): XI ROBOTIC ASSISTED THORASCOPY-RIGHT UPPER LOBE WEDGE RESECTION (Right) VIDEO BRONCHOSCOPY WITH ENDOBRONCHIAL NAVIGATION (N/A) INTERCOSTAL NERVE BLOCK (Right) NODE DISSECTION (Right)  SURGEON:  Surgeon(s) and Role:    * Melrose Nakayama, MD - Primary  PHYSICIAN ASSISTANT:   Nicholes Rough, PA-C  ANESTHESIA:   general  EBL:  25 mL   BLOOD ADMINISTERED:none  DRAINS: BLAKE DRAIN   LOCAL MEDICATIONS USED:  BUPIVICAINE   SPECIMEN:  Source of Specimen:  RIGHT UPPER LOBE WEDGE , LYMPH NODES  DISPOSITION OF SPECIMEN:  PATHOLOGY  COUNTS:  YES  TOURNIQUET:  * No tourniquets in log *  DICTATION: .Dragon Dictation  PLAN OF CARE: Admit to inpatient   PATIENT DISPOSITION:  PACU - hemodynamically stable.   Delay start of Pharmacological VTE agent (>24hrs) due to surgical blood loss or risk of bleeding: NO

## 2021-02-19 NOTE — Progress Notes (Signed)
Updated patients husband about surgery time per patient request. Patinets husband is going to go home and return between 1230-1300 this afternoon.  Please update patients husband per his cell phone number.

## 2021-02-19 NOTE — Plan of Care (Signed)

## 2021-02-19 NOTE — H&P (Signed)
PCP is Redmond School, MD Referring Provider is Derek Jack, MD      Chief Complaint  Patient presents with  . Lung Cancer    Initial surgical consult, CT chest 2/16, PET 3/14, PFTs 3/8    HPI: Marissa [name] is sent for consultation regarding a mixed density nodule in the right upper lobe.  Marissa [name] is a 55 year old woman with a history of tobacco abuse, COPD, stage Ia adenocarcinoma of the lung status post left upper lobectomy in 2006, type 2 insulin-dependent diabetes, hypertension, coronary disease, thoracic aortic atherosclerosis, thoracic aortic stent graft, arthritis, hypothyroidism, reflux, and anxiety.  She has smoked about a pack to a pack and a half a day for 32 years.  She has been followed with CT of the chest.  Dating back to about 2016 she has had a nodule in the right upper lobe.  She recently had a follow-up CT which showed mixed density lesion measuring 1.5 x 2.4 cm possibly slightly increased from August 2021 but definitely increased over 5 years.  She had a PET/CT which showed minimal activity.  She continues to smoke.  She says she is tried multiple methods for quitting including patches and Chantix.  She had adverse reaction to Chantix.  She has occasional burning chest pain that comes from her back and radiates around the ribs on both sides is relieved by eating.  This is not associated with exertion.  She does not have any exertional anginal symptoms.  She denies any change in appetite or weight loss.  She has not had any unusual headaches or visual changes.  She says that she can walk without any shortness of breath.  Zubrod Score: At the time of surgery this patient's most appropriate activity status/level should be described as: [] ?    0    Normal activity, no symptoms [x] ?    1    Restricted in physical strenuous activity but ambulatory, able to do out light work [] ?    2    Ambulatory and capable of self care, unable to do work activities, up  and about >50 % of waking hours                              [] ?    3    Only limited self care, in bed greater than 50% of waking hours [] ?    4    Completely disabled, no self care, confined to bed or chair [] ?    5    Moribund      Past Medical History:  Diagnosis Date  . Adenocarcinoma of lung ([place]) 02/06/2014  . Anxiety   . Arthritis   . COPD (chronic obstructive pulmonary disease) ([place])   . Coronary artery disease   . Diabetes mellitus   . GERD (gastroesophageal reflux disease)   . Hypertension   . Hypothyroidism   . Lung cancer [facility])          Past Surgical History:  Procedure Laterality Date  . APPENDECTOMY    . CHOLECYSTECTOMY    . LOBECTOMY Left 2006  . NM MYOCAR IMG MI  05/2008   lexiscan -perfusion defect in anterior myocardium (breast attenuation); remaining myocardium with NO evidence of ischemia or infarct; EF 78%; low risk scan  . OOPHORECTOMY Left   . PARTIAL HYSTERECTOMY  1985  . SALPINGOOPHORECTOMY Left    benign 9lb mass removed and a massive wall of smaller tumors removed  .  THORACIC AORTIC ENDOVASCULAR STENT GRAFT N/A 06/04/2019   Procedure: THORACIC AORTIC ENDOVASCULAR STENT GRAFT;  Surgeon: Angelia Mould, MD;  Location: [facility] OR;  Service: Vascular;  Laterality: N/A;  . TRANSTHORACIC ECHOCARDIOGRAM  05/2008   EF =/>55%, mild MR; trace TR         Family History  Problem Relation Age of Onset  . Colon cancer Mother   . Lung cancer Mother   . Heart disease Mother   . Heart disease Father   . Ovarian cancer Sister   . Hepatitis Brother   . Hernia Brother     Social History Social History        Tobacco Use  . Smoking status: Current Every Day Smoker    Packs/day: 1.50    Years: 32.00    Pack years: 48.00    Types: Cigarettes  . Smokeless tobacco: Never Used  Vaping Use  . Vaping Use: Never used  Substance Use Topics  . Alcohol use: No  . Drug use: No          Current Outpatient  Medications  Medication Sig Dispense Refill  . albuterol (PROVENTIL) (2.5 MG/3ML) 0.083% nebulizer solution SMARTSIG:1 Nebule Via Nebulizer Every 6 Hours PRN    . albuterol-ipratropium (COMBIVENT) 18-103 MCG/ACT inhaler Inhale 2 puffs into the lungs every 6 (six) hours as needed for wheezing or shortness of breath. 1 Inhaler 1  . ALPRAZolam (XANAX) 0.5 MG tablet Take 0.5 mg by mouth as needed. Take 0.5 mg at bedtime every night. Take 0.5mg  once a day as needed for anxiety.    Marland Kitchen aspirin EC 81 MG tablet Take 81 mg by mouth daily.    Marland Kitchen atorvastatin (LIPITOR) 80 MG tablet Take 1 tablet (80 mg total) by mouth daily at 6 PM. 30 tablet 6  . COMBIVENT RESPIMAT 20-100 MCG/ACT AERS respimat Inhale 2 puffs into the lungs 4 (four) times daily.    . Doxepin HCl 6 MG TABS TAKE 1 TABLET BY MOUTH 30 MINUTES BEFORE BEDTIME ON AN EMPTY STOMACH    . ELIQUIS 5 MG TABS tablet TAKE 1 TABLET(5 MG) BY MOUTH TWICE DAILY 60 tablet 5  . empagliflozin (JARDIANCE) 25 MG TABS tablet Take 1 tablet (25 mg total) by mouth daily before breakfast. 90 tablet 3  . EPIPEN 2-PAK 0.3 MG/0.3ML SOAJ injection Inject 0.3 mg into the muscle as needed for anaphylaxis. Has available  1  . esomeprazole (NEXIUM) 40 MG capsule Take 1 capsule (40 mg total) by mouth daily before breakfast. 30 capsule 3  . ezetimibe (ZETIA) 10 MG tablet Take 10 mg by mouth daily.    . insulin aspart protamine - aspart (NOVOLOG MIX 70/30 FLEXPEN) (70-30) 100 UNIT/ML FlexPen Inject 0.2 mLs (20 Units total) into the skin daily with breakfast AND 0.28 mLs (28 Units total) daily with supper. 45 mL 3  . Insulin Pen Needle (BD PEN NEEDLE NANO 2ND GEN) 32G X 4 MM MISC Inject 1 Device into the skin 2 (two) times daily. 100 each 11  . levothyroxine (SYNTHROID) 50 MCG tablet Take 1 tablet (50 mcg total) by mouth daily. 30 tablet 3  . lisinopril (ZESTRIL) 10 MG tablet Take 1 tablet (10 mg total) by mouth daily. 30 tablet 5  . metFORMIN (GLUCOPHAGE) 500 MG tablet  Take 1 tablet (500 mg total) by mouth 2 (two) times daily with a meal. 180 tablet 3  . ONETOUCH ULTRA test strip TEST 7 TIMES DAILY AS DIRECTED 500 strip 2  . oxyCODONE (ROXICODONE)  15 MG immediate release tablet Take 15 mg by mouth every 4 (four) hours as needed for pain.     Marland Kitchen SHINGRIX injection     . zolpidem (AMBIEN) 10 MG tablet Take 10 mg by mouth at bedtime as needed.     No current facility-administered medications for this visit.         Allergies  Allergen Reactions  . Bee Venom Anaphylaxis  . Penicillins Swelling, Rash and Other (See Comments)    Did it involve swelling of the face/tongue/throat, SOB, or low BP? Yes-whole body (swelling) Did it involve sudden or severe rash/hives, skin peeling, or any reaction on the inside of your mouth or nose? Yes-Hives Did you need to seek medical attention at a hospital or doctor's office? Yes When did it last happen?Childhood If all above answers are "NO", may proceed with cephalosporin use.  . Poison Oak Extract Anaphylaxis    Review of Systems  BP (!) 145/78 (BP Location: Right Arm, Patient Position: Sitting)   Pulse 100   Resp 20   Ht 5\' 5"  (1.651 m)   SpO2 94% Comment: RA  BMI 28.46 kg/m  Physical Exam   Diagnostic Tests: CT CHEST WITH CONTRAST  TECHNIQUE: Multidetector CT imaging of the chest was performed during intravenous contrast administration.  CONTRAST: 40mL OMNIPAQUE IOHEXOL 300 MG/ML SOLN  COMPARISON: 05/22/2020, 04/20/2018 and 02/06/2015.  FINDINGS: Cardiovascular: Atherosclerotic calcification of the aorta and coronary arteries. Endovascular stent graft is seen in descending thoracic aorta. Heart size normal. No pericardial effusion.  Mediastinum/Nodes: No pathologically enlarged mediastinal, hilar or axillary lymph nodes. Esophagus is grossly unremarkable.  Lungs/Pleura: A mixed solid and ground-glass nodule in the apical segment right upper lobe measures 1.5 x 2.4  cm (4/24), similar to slightly increased in size from 05/22/2020, at which time it measured approximately 1.2 x 2.0 cm. The internal solid component is roughly stable at 6 mm. When compared with exams dating back to 02/06/2015, there has been indolent growth, with the lesion measuring only 5 x 6 mm on 02/06/2015. Mild centrilobular emphysema. Left upper lobectomy. Pleuroparenchymal scarring at the base of the left hemithorax. Lungs are otherwise clear. No pleural fluid. Airway is otherwise unremarkable.  Upper Abdomen: Visualized portions of the liver and adrenal glands are unremarkable. Scarring in the kidneys bilaterally. Visualized portions of the spleen pancreas, stomach and bowel are unremarkable. Cholecystectomy. No upper abdominal adenopathy.  Musculoskeletal: Degenerative changes in the spine. No worrisome lytic or sclerotic lesions.  IMPRESSION: 1. Mixed solid and ground-glass nodule in the apical segment right upper lobe shows indolent growth from 02/06/2015 and is highly worrisome for adenocarcinoma. 2. Aortic atherosclerosis (ICD10-I70.0). Coronary artery calcification. 3. Emphysema (ICD10-J43.9).   Electronically Signed By: Lorin Picket M.D. On: 11/26/2020 15:12 NUCLEAR MEDICINE PET SKULL BASE TO THIGH  TECHNIQUE: 9.45 mCi F-18 FDG was injected intravenously. Full-ring PET imaging was performed from the skull base to thigh after the radiotracer. CT data was obtained and used for attenuation correction and anatomic localization.  Fasting blood glucose: 141 mg/dl  COMPARISON: November 26, 2020  FINDINGS: Mediastinal blood pool activity: SUV max 3.32  Liver activity: SUV max NA  NECK: No hypermetabolic lymph nodes in the neck.  Incidental CT findings: none  CHEST: Part solid nodule in the RIGHT upper lobe is unchanged compared to recent CT imaging and shows a maximum SUV of 1.4. Maximum size in the range of 2 cm. Internal solid  component with similar appearance.  Incidental CT findings: Signs of aortic endovascular  repair. Calcified atheromatous plaque in the thoracic aorta. Three-vessel coronary artery disease. Normal heart size. No pericardial effusion. Limited assessment of cardiovascular structures given lack of intravenous contrast. Signs of pulmonary emphysema. Evidence of prior partial lung resection, LEFT upper lobectomy. Esophagus grossly normal by CT.  ABDOMEN/PELVIS: No abnormal hypermetabolic activity within the liver, pancreas, adrenal glands, or spleen. No hypermetabolic lymph nodes in the abdomen or pelvis.  Incidental CT findings: Post cholecystectomy. Liver, pancreas, spleen, adrenal glands and kidneys without acute process. Marked RIGHT renal cortical scarring. No hydronephrosis. No acute gastrointestinal process. Colonic diverticulosis. Aortic atherosclerosis in the abdominal aorta without aneurysm. Uterus and adnexa unremarkable by CT.  Area of increased FDG uptake with a maximum SUV of 7.4 measuring 1.8 by 1.1 cm on image 314 of series 3 along the lower LEFT labia and medial thigh.  Mildly enlarged LEFT inguinal lymph node on image 288 of series 3 at 1 cm with a maximum SUV of 3.2.  SKELETON: No focal hypermetabolic activity to suggest skeletal metastasis.  Incidental CT findings: Spinal degenerative changes.  IMPRESSION: Area of concern in the RIGHT upper lobe shows no changes, morphologic features remain concerning for bronchogenic neoplasm. Nonspecific low level uptake less than mediastinal blood pool. FDG uptake could be seen in the setting of but are nonspecific.  Area of intense FDG uptake, focal and associated with either skin thickening or subcutaneous lesion about the LEFT labia and inter thigh. Findings are suspicious for small cutaneous or subcutaneous neoplasm. It is possible that urinary contamination could lead to spurious finding but given the focal  thickening that is measurable on the current study in this location would suggest correlation with direct clinical inspection.  Mildly prominent LEFT inguinal lymph node may be reactive, would be more suspicious if the finding in the LEFT labia or upper thigh described above is found to represent neoplasm. Uptake is near mediastinal blood pool.  These results will be called to the ordering clinician or representative by the Radiologist Assistant, and communication documented in the PACS or [employer].   Electronically Signed By: Zetta Bills M.D. On: 12/23/2020 09:13  I personally reviewed the CT and PET/CT images.  There is a mixed density nodule measuring about 1.5 x 2.5 cm in the apex of the right upper lobe.  There is no significant metabolic activity associated with this.  There is no evidence of regional or distant metastatic disease.  Pulmonary function testing 12/16/2020 FVC 2.60 (73%) FEV1 1.80 (64%) FEV1 1.93 (69%) postbronchodilator DLCO 13.55 (63%)  Impression: Marissa [name] is a 55 year old woman with a history of tobacco abuse, COPD, stage Ia adenocarcinoma of the lung status post left upper lobectomy in 2006, type 2 insulin-dependent diabetes, hypertension, coronary disease, thoracic aortic atherosclerosis, thoracic aortic stent graft, arthritis, hypothyroidism, reflux, and anxiety.  She has been followed with CT scans since her lobectomy back in 2006.  Over the past several years she has had a slowly growing mixed density nodule in the right upper lobe.  I reviewed the images with Mrs. Tuch and her husband.  We discussed the differential diagnosis.  They understand that this is highly concerning for a new primary bronchogenic carcinoma.  To date it would appear to be a low-grade lesion but there is concern with mixed density that there could be an invasive component and this could ultimately become more aggressive or spread.  Infectious inflammatory  nodules are also in the differential diagnosis but are far less likely.  We discussed potential treatment options including  surgical resection versus stereotactic radiation.  We discussed the relative advantages and disadvantages of each approach.  She is adamantly against having radiation.  The nodule is mixed density, slow-growing, peripheral, and has minimal activity on PET.  The solid component is less than a centimeter.  In addition she is already had a lobectomy on the other side.  I think it would be best to limit the resection to preserve her lung function.  We discussed the potential surgical procedure.  We would plan to do a navigational bronchoscopy to mark the nodule followed by a Xi robotic assisted right upper lobe wedge resection.  I informed him of the general nature of the procedure including the need for general anesthesia, the incisions to be used, the use of a drainage tube postoperatively, the expected hospital stay, and the overall recovery.  They understand there is always a possibility of a need for conversion to an open procedure.  I informed them of the indications, risks, benefits, and alternatives.  They understand the risks include, but are not limited to death, MI, DVT, PE, bleeding, possible need for transfusion, infection, prolonged air leak, cardiac arrhythmias, as well as possibility of other unstable complications.  Tobacco abuse-I spent approximately half the visit today counseling her on the importance of tobacco cessation.  She has had lung cancer, probably has a second lung cancer, hypertension, CAD, thoracic aortic atherosclerosis with embolization, and is at risk for other cardiovascular complications such as stroke.  She did say she would try to quit but did not seem convinced.  CAD-multiple risk factors.  No chest pain.  There is any indication for additional testing at this point but she does have significant cardiovascular risk given her  history.  Anticoagulation for thoracic aortic atherosclerotic disease-stent graft in place.  She will need to hold Eliquis for 2 days prior to any surgical procedure.  COPD-FEV1 and DLCO were both about two thirds of predicted.  She would tolerate a lobectomy should it be necessary but unless we have issues with margin we should be able to treat this relatively low-grade lesion with a wedge resection.  Type 2 diabetes-on p.o. meds and insulin.  Her most recent A1c was 8.4.  Just down from about 14 a year ago.  Plan: Quit smoking Tentatively plan for navigational bronchoscopy for tumor marking followed by robotic right upper lobe wedge resection on Wednesday, 02/19/2021  Melrose Nakayama, MD [facility] [phone] d.  CAD-multiple risk factors.  No chest pain.  There is any indication for additional testing at this point but she does have significant cardiovascular risk given her history.  Anticoagulation for thoracic aortic atherosclerotic disease-stent graft in place.  She will need to hold Eliquis for 2 days prior to any surgical procedure.  COPD-FEV1 and DLCO were both about two thirds of predicted.  She would tolerate a lobectomy should it be necessary but unless we have issues with margin we should be able to treat this relatively low-grade lesion with a wedge resection.  Type 2 diabetes-on p.o. meds and insulin.  Her most recent A1c was 8.4.  Just down from about 14 a year ago.  Plan: Quit smoking Tentatively plan for navigational bronchoscopy for tumor marking followed by robotic right upper lobe wedge resection on Wednesday, 02/19/2021  Melrose Nakayama, MD [facility] [phone]

## 2021-02-19 NOTE — Transfer of Care (Signed)
Immediate Anesthesia Transfer of Care Note  Patient: Marissa [name]  Procedure(s) Performed: XI ROBOTIC ASSISTED THORASCOPY-RIGHT UPPER LOBE WEDGE RESECTION (Right Chest) VIDEO BRONCHOSCOPY WITH ENDOBRONCHIAL NAVIGATION (N/A ) INTERCOSTAL NERVE BLOCK (Right Chest) NODE DISSECTION (Right Chest)  Patient Location: PACU  Anesthesia Type:General  Level of Consciousness: awake, patient cooperative and responds to stimulation  Airway & Oxygen Therapy: Patient Spontanous Breathing and Patient connected to nasal cannula oxygen  Post-op Assessment: Report given to RN, Post -op Vital signs reviewed and stable and Patient moving all extremities X 4  Post vital signs: Reviewed and stable  Last Vitals:  Vitals Value Taken Time  BP 98/68 02/19/21 1129  Temp    Pulse 76 02/19/21 1133  Resp 22 02/19/21 1133  SpO2 96 % 02/19/21 1133  Vitals shown include unvalidated device data.  Last Pain:  Vitals:   02/19/21 0559  PainSc: 0-No pain      Patients Stated Pain Goal: 4 (15/72/62 0355)  Complications: No complications documented.

## 2021-02-19 NOTE — Anesthesia Procedure Notes (Signed)
Procedure Name: Intubation Date/Time: 02/19/2021 8:56 AM Performed by: Betha Loa, CRNA Pre-anesthesia Checklist: Patient identified, Emergency Drugs available, Suction available and Patient being monitored Patient Re-evaluated:Patient Re-evaluated prior to induction Oxygen Delivery Method: Circle System Utilized Preoxygenation: Pre-oxygenation with 100% oxygen Induction Type: IV induction Laryngoscope Size: Mac and 3 Tube type: Oral Endobronchial tube: EBT position confirmed by fiberoptic bronchoscope, Double lumen EBT, EBT position confirmed by auscultation and Left and 37 Fr Number of attempts: 1 Airway Equipment and Method: Stylet and Oral airway Placement Confirmation: ETT inserted through vocal cords under direct vision,  positive ETCO2 and breath sounds checked- equal and bilateral Tube secured with: Tape Dental Injury: Teeth and Oropharynx as per pre-operative assessment

## 2021-02-19 NOTE — Interval H&P Note (Signed)
History and Physical Interval Note: No changes in interim Has not quit smoking 02/19/2021 8:03 AM  Marissa [name]  has presented today for surgery, with the diagnosis of RUL LUNG NODULE.  The various methods of treatment have been discussed with the patient and family. After consideration of risks, benefits and other options for treatment, the patient has consented to  Procedure(s): XI ROBOTIC ASSISTED THORASCOPY-RIGHT UPPER LOBE WEDGE RESECTION (Right) VIDEO BRONCHOSCOPY WITH ENDOBRONCHIAL NAVIGATION (N/A) as a surgical intervention.  The patient's history has been reviewed, patient examined, no change in status, stable for surgery.  I have reviewed the patient's chart and labs.  Questions were answered to the patient's satisfaction.     Melrose Nakayama

## 2021-02-19 NOTE — Anesthesia Postprocedure Evaluation (Signed)
Anesthesia Post Note  Patient: [name]  Procedure(s) Performed: XI ROBOTIC ASSISTED THORASCOPY-RIGHT UPPER LOBE WEDGE RESECTION (Right Chest) VIDEO BRONCHOSCOPY WITH ENDOBRONCHIAL NAVIGATION (N/A ) INTERCOSTAL NERVE BLOCK (Right Chest) NODE DISSECTION (Right Chest)     Patient location during evaluation: PACU Anesthesia Type: General Level of consciousness: sedated Pain management: pain level controlled Vital Signs Assessment: post-procedure vital signs reviewed and stable Respiratory status: spontaneous breathing and respiratory function stable Cardiovascular status: stable Postop Assessment: no apparent nausea or vomiting Anesthetic complications: no   No complications documented.  Last Vitals:  Vitals:   02/19/21 1315 02/19/21 1330  BP: (!) 102/56 (!) 96/57  Pulse: 66 80  Resp: 19 16  Temp:    SpO2: 98% 97%    Last Pain:  Vitals:   02/19/21 1300  PainSc: 0-No pain                 Mylan Schwarz DANIEL

## 2021-02-20 ENCOUNTER — Encounter (HOSPITAL_COMMUNITY): Payer: Self-pay | Admitting: Thoracic Surgery (Cardiothoracic Vascular Surgery)

## 2021-02-20 ENCOUNTER — Inpatient Hospital Stay (HOSPITAL_COMMUNITY): Payer: Medicare HMO

## 2021-02-20 LAB — CBC
HCT: 36.7 % (ref 36.0–46.0)
Hemoglobin: 11.9 g/dL — ABNORMAL LOW (ref 12.0–15.0)
MCH: 30 pg (ref 26.0–34.0)
MCHC: 32.4 g/dL (ref 30.0–36.0)
MCV: 92.4 fL (ref 80.0–100.0)
Platelets: 285 10*3/uL (ref 150–400)
RBC: 3.97 MIL/uL (ref 3.87–5.11)
RDW: 13.4 % (ref 11.5–15.5)
WBC: 15.1 10*3/uL — ABNORMAL HIGH (ref 4.0–10.5)
nRBC: 0 % (ref 0.0–0.2)

## 2021-02-20 LAB — BASIC METABOLIC PANEL
Anion gap: 6 (ref 5–15)
BUN: 17 mg/dL (ref 6–20)
CO2: 25 mmol/L (ref 22–32)
Calcium: 9 mg/dL (ref 8.9–10.3)
Chloride: 107 mmol/L (ref 98–111)
Creatinine, Ser: 0.66 mg/dL (ref 0.44–1.00)
GFR, Estimated: >60 mL/min (ref >60)
Glucose, Bld: 134 mg/dL — ABNORMAL HIGH (ref 70–99)
Potassium: 4.4 mmol/L (ref 3.5–5.1)
Sodium: 138 mmol/L (ref 135–145)

## 2021-02-20 LAB — GLUCOSE, CAPILLARY
Glucose-Capillary: 134 mg/dL — ABNORMAL HIGH (ref 70–99)
Glucose-Capillary: 182 mg/dL — ABNORMAL HIGH (ref 70–99)
Glucose-Capillary: 182 mg/dL — ABNORMAL HIGH (ref 70–99)
Glucose-Capillary: 141 mg/dL — ABNORMAL HIGH (ref 70–99)

## 2021-02-20 MED ORDER — CHLORHEXIDINE GLUCONATE CLOTH 2 % EX PADS: 6.0000 | MEDICATED_PAD | Freq: Every day | CUTANEOUS | Status: DC

## 2021-02-20 NOTE — Op Note (Signed)
NAMEAMERICUS, SCHEURICH MEDICAL RECORD NO: [id number] ACCOUNT NO: [id number] DATE OF BIRTH: Aug 21, 1966 FACILITY: [facility] LOCATION: [facility] PHYSICIAN: Revonda Standard. Roxan Hockey, MD  Operative Report   DATE OF PROCEDURE: 02/19/2021  PREOPERATIVE DIAGNOSIS:  Right upper lobe lung nodule.  POSTOPERATIVE DIAGNOSIS:  Non-small cell carcinoma, right upper lobe, clinical stage IA (T1, N0).  PROCEDURE:   Electromagnetic navigational bronchoscopy for tumor marking,  Xi robotic-assisted right thoracoscopy, Right upper lobe wedge resection, Lymph node sampling and Intercostal nerve blocks levels 3 through 10.  SURGEON:  Modesto Charon, MD  ASSISTANT:  Nicholes Rough, PA  ANESTHESIA:  General.  FINDINGS:  Navigated into close proximity to the nodule, marking performed with fluoroscopy.  Markings not visible on Firefly. Frozen section of nodule showed non-small cell carcinoma.  Parenchymal margin benign.  CLINICAL NOTE:  Mrs. Shimer is a 55 year old woman with a history of lung cancer resected in 2006.  She has a history of ongoing tobacco abuse and COPD.  She has been followed for a right upper lobe lung nodule for several years.  Most recently, the  nodule had slightly increased in size in comparison to a film from 05/2020.  On PET/CT, there was mild activity.  She was advised to undergo surgical resection.  Given the peripheral nature of the lesion and its low-grade intensity and her previous surgery  on the contralateral side, a wedge resection would be the preferred option in her case.  The indications, risks, benefits, and alternatives were discussed in detail with the patient.  She understood and accepted the risks and agreed to proceed.  DESCRIPTION OF PROCEDURE:  Mrs. Dorame was brought to the operating room on 02/19/2021.  She had induction of general anesthesia and was intubated with a single lumen endotracheal tube.  Sequential compression devices were placed on the calves for DVT   prophylaxis.  A Foley catheter was placed.  Intravenous antibiotics were administered.  Planning for the navigational bronchoscopy was done on the console prior to induction.  Flexible fiberoptic bronchoscopy was performed via the endotracheal tube. On  the left side, there was a well-healed bronchial stump.  There were no endobronchial lesions to the level of the subsegmental bronchi.  Locatable guide for navigation was placed.  Registration was performed.  The bronchoscope then was directed to the  right upper lobe bronchus and the locatable guide was advanced into the appropriate subsegmental bronchus.  There was good proximity to the lesion and the catheter was within 1.8 cm of the center of the lesion.  An aspirating needle was preflushed with a solution  containing 50% ICG and 50% methylene blue.  The needle was advanced into the nodule and 0.5 mL of the solution was injected at the site.  This was done with fluoroscopic guidance.  The bronchoscope was removed.  The patient was reintubated with a double lumen endotracheal tube.  She was placed in a left lateral decubitus position and the right chest was prepped and draped in the usual sterile fashion.  Single lung ventilation of the left lung was initiated.  She  did have some desaturations initially and some oxygen was insufflated into the right side and she maintained adequate saturations thereafter.  The right chest was prepped and draped in the usual sterile fashion.  A timeout was performed.  A solution containing 20 mL of liposomal bupivacaine, 30 mL of 0.5% bupivacaine and 50 mL of saline was prepared.  This was used for local at the incision sites as well as for the  intercostal nerve blocks.  An incision was made  in the eighth interspace in the mid axillary line.  An 8 mm port was inserted.  The thoracoscope was advanced in the chest. After confirming intrapleural placement, carbon dioxide was insufflated per protocol.  A 12 mm port was  placed in the eighth  interspace anteriorly.  Intercostal nerve blocks then were performed from the third to the tenth interspace.  A needle was placed from a posterior approach and 10 mL of the bupivacaine solution was injected into a subpleural plane at each level from the  third to the tenth.  Two additional robotic ports were placed in the eighth interspace and then a 12 mm AirSeal port was placed in the tenth interspace posterolaterally. The robot was deployed.  The camera arm was docked.  The scope was inserted,  targeting was performed.  The remaining ports were docked.  The instruments were inserted with thoracoscopic visualization.  The lung was inspected.  The methylene blue was not evident.  Using the [facility] setting on the robot also did not reveal any evidence of ICG, it is unclear why that was the case.  The apical segment of the right upper lobe was relatively well demarcated,  knowing that the nodule was near the apex, a wide wedge resection was performed incorporating the apex of the right upper lobe.  This was done with multiple firings of the robotic stapler using primarily green and black cartridges.  The specimen was  placed into an endoscopic retrieval bag and removed.  The nodule was marked and it was sent for frozen section of the nodule and the stapled margin.  There was approximately a 2 cm gross margin when accounting for the staples.  While awaiting the results  of the frozen section, the pleural reflection was divided.  The inferior ligament was divided with bipolar cautery.  No node was encountered in that area.  The pleural reflection was divided posteriorly and the level 7 node was identified.  This node  was enlarged, but otherwise benign appearing.  It was removed and sent for permanent pathology.  The lung then was retracted inferiorly.  There was no immediately evident level 10 node.  The pleura superior to the azygos vein overlying the mediastinum  was incised and the  right paratracheal level 4 nodes were inspected.  There was a markedly enlarged node in this area, but again this appeared grossly benign other than it being significantly enlarged.  This node was also removed and sent for permanent  pathology.  At this point, the frozen section returned showing non-small cell carcinoma.  The stapled margin was benign.  Chest was copiously irrigated with saline.  The robotic instruments were removed.  The robot was undocked.  The chest was copiously  irrigated with warm saline.  A 28-French Blake drain was placed through the anterior most port incision and directed to the apex.  It was secured with a #1 silk suture.  Dual lung ventilation was resumed.  The remaining incisions were closed with 0  Vicryl fascial sutures and 3-0 Vicryl subcuticular sutures.  Dermabond was applied.  The chest tube was placed to a Pleur-evac on waterseal.  The patient then was extubated in the operating room and taken to the postanesthetic care unit in good  condition.  All sponge, needle, and instrument counts were correct at the end the procedure.  SHW D: 02/19/2021 6:24:31 pm T: 02/20/2021 4:33:00 am  JOB: [id number]/ [id number]

## 2021-02-20 NOTE — Progress Notes (Addendum)
      [address]       [place] [address]             [phone]      1 Day Post-Op Procedure(s) (LRB): XI ROBOTIC ASSISTED THORASCOPY-RIGHT UPPER LOBE WEDGE RESECTION (Right) VIDEO BRONCHOSCOPY WITH ENDOBRONCHIAL NAVIGATION (N/A) INTERCOSTAL NERVE BLOCK (Right) NODE DISSECTION (Right) Subjective: Upset this morning because she did not sleep. Pain is not well controlled.   Objective: Vital signs in last 24 hours: Temp:  [97.6 F (36.4 C)-98.4 F (36.9 C)] 98.4 F (36.9 C) (05/13 0300) Pulse Rate:  [62-93] 76 (05/13 0300) Cardiac Rhythm: Normal sinus rhythm (05/12 1900) Resp:  [16-24] 24 (05/13 0300) BP: (85-110)/(49-73) 101/56 (05/13 0300) SpO2:  [94 %-100 %] 94 % (05/13 0300) Arterial Line BP: (104-133)/(29-68) 133/68 (05/12 1330)     Intake/Output from previous day: 05/12 0701 - 05/13 0700 In: 2150 [P.O.:300; I.V.:1200; IV Piggyback:500] Out: 2765 [Urine:2540; Blood:25; Chest Tube:200] Intake/Output this shift: No intake/output data recorded.  General appearance: alert, cooperative and no distress Heart: regular rate and rhythm, S1, S2 normal, no murmur, click, rub or gallop Lungs: clear to auscultation bilaterally Abdomen: soft, non-tender; bowel sounds normal; no masses,  no organomegaly Extremities: extremities normal, atraumatic, no cyanosis or edema Wound: clean and dry  Lab Results: Recent Labs    02/17/21 1400 02/20/21 0106  WBC 13.3* 15.1*  HGB 15.3* 11.9*  HCT 47.5* 36.7  PLT 356 285   BMET:  Recent Labs    02/17/21 1400 02/20/21 0106  NA 136 138  K 4.3 4.4  CL 104 107  CO2 20* 25  GLUCOSE 124* 134*  BUN 12 17  CREATININE 0.66 0.66  CALCIUM 9.1 9.0    PT/INR:  Recent Labs    02/17/21 1400  LABPROT 12.3  INR 0.9   ABG    Component Value Date/Time   PHART 7.415 02/17/2021 1403   HCO3 23.1 02/17/2021 1403   ACIDBASEDEF 0.8 02/17/2021 1403   O2SAT 97.2 02/17/2021 1403   CBG (last 3)  Recent Labs     02/19/21 1612 02/19/21 2115 02/20/21 0530  GLUCAP 257* 175* 134*    Assessment/Plan: S/P Procedure(s) (LRB): XI ROBOTIC ASSISTED THORASCOPY-RIGHT UPPER LOBE WEDGE RESECTION (Right) VIDEO BRONCHOSCOPY WITH ENDOBRONCHIAL NAVIGATION (N/A) INTERCOSTAL NERVE BLOCK (Right) NODE DISSECTION (Right)  1. CXR stable without obvious pneumo. Will await read.  2. Chest tube on water seal without air leak, on room air with good oxygen saturation 3. BP low normal, does not appear to be symptomatic, NSR in the 70s 4. Creatinine stable at 0.66, electrolytes okay 5. Blood glucose well controlled on insulin. Will plan to start PO meds soon 6. H and H 11.9/36.7, stable 7. Continue Lovenox for DVT prophylaxis  Plan: Her sleeping medications have been started for tonight. She has Toradol, Tramadol, and Oxycodone for pain as well as Fentanyl for breakthrough. Discontinue foley catheter and fluids this morning. Might be able to discontinue chest tube. Continue to encourage incentive spirometer and ambulation in the halls.     LOS: 1 day    [name] 02/20/2021 Patient seen and examined, agree with above Tiny air bubble with second cough, will keep tube this AM' [place]. Roxan Hockey, MD [facility] [phone]

## 2021-02-20 NOTE — Plan of Care (Signed)
  Problem: Education: Goal: Knowledge of General Education information will improve Description: Including pain rating scale, medication(s)/side effects and non-pharmacologic comfort measures Outcome: Progressing   Problem: Health Behavior/Discharge Planning: Goal: Ability to manage health-related needs will improve Outcome: Progressing   Problem: Clinical Measurements: Goal: Ability to maintain clinical measurements within normal limits will improve Outcome: Progressing   Problem: Clinical Measurements: Goal: Diagnostic test results will improve Outcome: Progressing   Problem: Clinical Measurements: Goal: Respiratory complications will improve Outcome: Progressing   Problem: Activity: Goal: Risk for activity intolerance will decrease Outcome: Progressing   Problem: Nutrition: Goal: Adequate nutrition will be maintained Outcome: Progressing   Problem: Pain Managment: Goal: General experience of comfort will improve Outcome: Progressing   Problem: Skin Integrity: Goal: Risk for impaired skin integrity will decrease Outcome: Progressing   

## 2021-02-20 NOTE — Discharge Summary (Addendum)
Physician Discharge Summary       [place]       ,[place] [address]             [phone]       Patient ID: Marissa [name] MRN: [id number] DOB/AGE: Jun 04, 1966 55 y.o.  Admit date: 02/19/2021 Discharge date: 02/23/2021  Admission Diagnoses: Right upper lobe lung nodule  Patient Active Problem List   Diagnosis Date Noted  . Hypokalemia 02/16/2021  . Diabetes mellitus ([place]) 07/24/2019  . Type 2 diabetes mellitus with diabetic polyneuropathy, with long-term current use of insulin ([place]) 07/24/2019  . Type 2 diabetes mellitus with hyperglycemia, with long-term current use of insulin ([place]) 07/24/2019  . Thoracic aortic atherosclerosis ([place]) 06/04/2019  . Dyslipidemia 06/01/2019  . Type 2 diabetes mellitus with other specified complication ([place]) 68/09/7516  . PAD (peripheral artery disease)/descending thoracic aorta plaque rupture 04/19/2019  . Anticoagulant long-term use 04/19/2019  . DKA, type 2 ([place]) 04/19/2019  . Tobacco abuse 04/19/2019  . Hyperlipidemia LDL goal <100 04/19/2019  . Xanthelasma of eyelid, bilateral/HLD 04/19/2019  . Hyperglycemia 04/17/2019  . Adenocarcinoma of lung ([place]) 02/06/2014    Discharge Diagnoses:  Adenocarcinoma of the lung-clinical and pathologic stage Ia (T1, N0) Active Problems:   S/P robot-assisted surgical procedure   Discharged Condition: good  HPI:   [name] is a 55 year old woman with a history of tobacco abuse, COPD,stage Ia adenocarcinoma of the lung status post left upper lobectomy in 2006, type 2 insulin-dependent diabetes, hypertension, coronary disease, thoracic aortic atherosclerosis, thoracic aortic stent graft, arthritis, hypothyroidism, reflux, and anxiety. She has smoked about a pack to a pack and a half a day for 32 years. She has been followed with CT of the chest. Dating back to about 2016 she has had a nodule in the right upper lobe. She recently had a follow-up CT which showed mixed  density lesion measuring 1.5 x 2.4 cm possibly slightly increased from August 2021 but definitely increased over 5 years. She had a PET/CT which showed minimal activity. She continues to smoke. She says she is tried multiple methods for quitting including patches and Chantix. She had adverse reaction to Chantix. She has occasional burning chest pain that comes from her back and radiates around the ribs on both sides is relieved by eating. This is not associated with exertion. She does not have any exertional anginal symptoms. She denies any change in appetite or weight loss. She has not had any unusual headaches or visual changes. She says that she can walk without any shortness of breath.  Hospital Course:   On 02/19/2021 [name] underwent a robotic assisted right thoracoscopy with right upper lobe wedge resection and lymph node sampling with Dr. Koleen Nimrod.  She tolerated the procedure well and was transferred to the cardiac stepdown unit for continued care.  Most of her home medications were resumed.  She was extubated in a timely manner.  Postop day 1 her chest tube was to waterseal with a small intermittent air leak and her chest x-ray was stable.  We decided to keep her chest tube in 1 more day.  Her Foley catheter was removed and her IV fluids were discontinued.  She was encouraged to ambulate around the unit and use her incentive spirometer.  Her chest tube was removed on 5/15.  Follow-up chest x-ray showed tiny right pneumo and subcutaneous emphysema .  Today she is tolerating room air, her pain is well controlled, she is ambulating with limited assistance,  her incisions are healing well, and she is ready for discharge home.  Consults: None  Significant Diagnostic Studies:   CLINICAL DATA:  Chest tube  EXAM: PORTABLE CHEST 1 VIEW  COMPARISON:  02/19/2021  FINDINGS: Descending aortic stent graft noted, unchanged. Heart is normal size. Right chest tube in place. No visible  pneumothorax. Scarring at the left lung base.  IMPRESSION: Right chest tube remains in place. No pneumothorax. No significant change.   Electronically Signed   By: Rolm Baptise M.D.   On: 02/20/2021 08:18  Treatments:  NAME: JAEL, WALDORF MEDICAL RECORD NO: [id number] ACCOUNT NO: [id number] DATE OF BIRTH: 1966-04-07 [facility]ACILITY[facility] LOCATION: [facility] PHYSICIAN: Revonda Standard. Roxan Hockey, MD  Operative Report   DATE OF PROCEDURE: 02/19/2021  PREOPERATIVE DIAGNOSIS:  Right upper lobe lung nodule.  POSTOPERATIVE DIAGNOSIS:  Non-small cell carcinoma, right upper lobe, clinical stage IA (T1, N0).  PROCEDURE:  Electromagnetic navigational bronchoscopy for tumor marking, Xi robotic-assisted right thoracoscopy, right upper lobe wedge resection, lymph node sampling and intercostal nerve blocks levels 3 through 10.  SURGEON:  Modesto Charon, MD  ASSISTANT:  Nicholes Rough, PA  ANESTHESIA:  General.  FINDINGS:  Navigated into close proximity to the nodule, marking performed with fluoroscopy.  Markings not visible on Firefly. Frozen section of nodule showed non-small cell carcinoma.  Parenchymal margin benign.   Discharge Exam: Blood pressure (!) 102/56, pulse 73, temperature 97.6 F (36.4 C), temperature source Oral, resp. rate 17, height 5\' 5"  (1.651 m), weight 74.8 kg, SpO2 95 %.  General appearance: alert, cooperative and no distress Heart: regular rate and rhythm, S1, S2 normal, no murmur, click, rub or gallop Lungs: clear to auscultation bilaterally Abdomen: soft, non-tender; bowel sounds normal; no masses,  no organomegaly Extremities: extremities normal, atraumatic, no cyanosis or edema Wound: clean and dry  Disposition: Discharge disposition: 01-Home or Self Care        Allergies as of 02/23/2021      Reactions   Bee Venom Anaphylaxis   Penicillins Swelling, Rash, Other (See Comments)   Did it involve swelling of the face/tongue/throat, SOB, or low  BP? Yes-whole body (swelling) Did it involve sudden or severe rash/hives, skin peeling, or any reaction on the inside of your mouth or nose? Yes-Hives Did you need to seek medical attention at a hospital or doctor's office? Yes When did it last happen?Childhood If all above answers are "NO", may proceed with cephalosporin use.   Poison Oak Extract Anaphylaxis      Medication List    STOP taking these medications   lisinopril 10 MG tablet Commonly known as: ZESTRIL     TAKE these medications   acetaminophen 500 MG tablet Commonly known as: TYLENOL Take 2 tablets (1,000 mg total) by mouth every 6 (six) hours.   albuterol (2.5 MG/3ML) 0.083% nebulizer solution Commonly known as: PROVENTIL Take 2.5 mg by nebulization every 6 (six) hours as needed for shortness of breath or wheezing.   albuterol-ipratropium 18-103 MCG/ACT inhaler Commonly known as: COMBIVENT Inhale 2 puffs into the lungs every 6 (six) hours as needed for wheezing or shortness of breath.   ALPRAZolam 0.5 MG tablet Commonly known as: XANAX Take 0.5 mg by mouth at bedtime.   aspirin EC 81 MG tablet Take 81 mg by mouth daily.   atorvastatin 80 MG tablet Commonly known as: LIPITOR Take 1 tablet (80 mg total) by mouth daily at 6 PM.   BD Pen Needle Nano 2nd Gen 32G X 4 MM Misc Generic drug:  Insulin Pen Needle Inject 1 Device into the skin 2 (two) times daily.   Eliquis 5 MG Tabs tablet Generic drug: apixaban TAKE 1 TABLET(5 MG) BY MOUTH TWICE DAILY What changed: See the new instructions.   empagliflozin 25 MG Tabs tablet Commonly known as: Jardiance Take 1 tablet (25 mg total) by mouth daily before breakfast.   esomeprazole 40 MG capsule Commonly known as: NEXIUM Take 1 capsule (40 mg total) by mouth daily before breakfast.   ezetimibe 10 MG tablet Commonly known as: ZETIA Take 10 mg by mouth daily.   latanoprost 0.005 % ophthalmic solution Commonly known as: XALATAN Place 1 drop into both  eyes at bedtime.   levothyroxine 50 MCG tablet Commonly known as: SYNTHROID Take 1 tablet (50 mcg total) by mouth daily.   metFORMIN 500 MG tablet Commonly known as: Glucophage Take 1 tablet (500 mg total) by mouth 2 (two) times daily with a meal.   NovoLOG Mix 70/30 FlexPen (70-30) 100 UNIT/ML FlexPen Generic drug: insulin aspart protamine - aspart Inject 0.22 mLs (22 Units total) into the skin daily with breakfast AND 0.28 mLs (28 Units total) daily with supper.   OneTouch Ultra test strip Generic drug: glucose blood TEST 7 TIMES DAILY AS DIRECTED   oxyCODONE 15 MG immediate release tablet Commonly known as: ROXICODONE Take 15 mg by mouth every 6 (six) hours as needed for pain.   zolpidem 10 MG tablet Commonly known as: AMBIEN Take 10 mg by mouth at bedtime.       Follow-up Information    Redmond School, MD. Call in 1 day(s).   Specialty: Internal Medicine Contact information: [address] [place] [address] [phone]        Melrose Nakayama, MD Follow up.   Specialty: Cardiothoracic Surgery Why: Your routine follow-up appointment is on 5/31 at 11:45am. Please arrive at 11:15am for a chest xray located at [facility] which is on the first floor of our building.  Contact information: [address] [place] [address] [phone]               Signed: [name] 02/23/2021, 1:56 PM

## 2021-02-20 NOTE — Plan of Care (Signed)

## 2021-02-21 ENCOUNTER — Inpatient Hospital Stay (HOSPITAL_COMMUNITY): Payer: Medicare HMO

## 2021-02-21 LAB — GLUCOSE, CAPILLARY
Glucose-Capillary: 159 mg/dL — ABNORMAL HIGH (ref 70–99)
Glucose-Capillary: 157 mg/dL — ABNORMAL HIGH (ref 70–99)
Glucose-Capillary: 170 mg/dL — ABNORMAL HIGH (ref 70–99)
Glucose-Capillary: 171 mg/dL — ABNORMAL HIGH (ref 70–99)

## 2021-02-21 LAB — CBC
HCT: 40.1 % (ref 36.0–46.0)
Hemoglobin: 13 g/dL (ref 12.0–15.0)
MCH: 30.1 pg (ref 26.0–34.0)
MCHC: 32.4 g/dL (ref 30.0–36.0)
MCV: 92.8 fL (ref 80.0–100.0)
Platelets: 289 10*3/uL (ref 150–400)
RBC: 4.32 MIL/uL (ref 3.87–5.11)
RDW: 13.6 % (ref 11.5–15.5)
WBC: 12.7 10*3/uL — ABNORMAL HIGH (ref 4.0–10.5)
nRBC: 0 % (ref 0.0–0.2)

## 2021-02-21 LAB — COMPREHENSIVE METABOLIC PANEL
ALT: 21 U/L (ref 0–44)
AST: 17 U/L (ref 15–41)
Albumin: 3.3 g/dL — ABNORMAL LOW (ref 3.5–5.0)
Alkaline Phosphatase: 65 U/L (ref 38–126)
Anion gap: 8 (ref 5–15)
BUN: 15 mg/dL (ref 6–20)
CO2: 21 mmol/L — ABNORMAL LOW (ref 22–32)
Calcium: 8.7 mg/dL — ABNORMAL LOW (ref 8.9–10.3)
Chloride: 111 mmol/L (ref 98–111)
Creatinine, Ser: 0.67 mg/dL (ref 0.44–1.00)
GFR, Estimated: >60 mL/min (ref >60)
Glucose, Bld: 133 mg/dL — ABNORMAL HIGH (ref 70–99)
Potassium: 4.3 mmol/L (ref 3.5–5.1)
Sodium: 140 mmol/L (ref 135–145)
Total Bilirubin: 0.7 mg/dL (ref 0.3–1.2)
Total Protein: 5.6 g/dL — ABNORMAL LOW (ref 6.5–8.1)

## 2021-02-21 NOTE — Progress Notes (Signed)
2 Days Post-Op Procedure(s) (LRB): XI ROBOTIC ASSISTED THORASCOPY-RIGHT UPPER LOBE WEDGE RESECTION (Right) VIDEO BRONCHOSCOPY WITH ENDOBRONCHIAL NAVIGATION (N/A) INTERCOSTAL NERVE BLOCK (Right) NODE DISSECTION (Right) Subjective: Mild pain  Objective: Vital signs in last 24 hours: Temp:  [97.8 F (36.6 C)-98.4 F (36.9 C)] 98.4 F (36.9 C) (05/14 0735) Pulse Rate:  [66-77] 77 (05/14 0300) Cardiac Rhythm: Normal sinus rhythm (05/14 0800) Resp:  [17-20] 17 (05/14 0735) BP: (102-131)/(56-74) 123/61 (05/14 0735) SpO2:  [92 %-100 %] 97 % (05/14 0735)  Hemodynamic parameters for last 24 hours:    Intake/Output from previous day: 05/13 0701 - 05/14 0700 In: -  Out: 320 [Chest Tube:320] Intake/Output this shift: Total I/O In: 220 [P.O.:220] Out: 40 [Chest Tube:40]  General appearance: alert and cooperative Heart: regular rate and rhythm, S1, S2 normal, no murmur, click, rub or gallop Lungs: diminished breath sounds RLL Wound: c/d/i  Lab Results: Recent Labs    02/20/21 0106 02/21/21 0043  WBC 15.1* 12.7*  HGB 11.9* 13.0  HCT 36.7 40.1  PLT 285 289   BMET:  Recent Labs    02/20/21 0106 02/21/21 0043  NA 138 140  K 4.4 4.3  CL 107 111  CO2 25 21*  GLUCOSE 134* 133*  BUN 17 15  CREATININE 0.66 0.67  CALCIUM 9.0 8.7*    PT/INR: No results for input(s): LABPROT, INR in the last 72 hours. ABG    Component Value Date/Time   PHART 7.415 02/17/2021 1403   HCO3 23.1 02/17/2021 1403   ACIDBASEDEF 0.8 02/17/2021 1403   O2SAT 97.2 02/17/2021 1403   CBG (last 3)  Recent Labs    02/20/21 1557 02/20/21 2109 02/21/21 0618  GLUCAP 182* 141* 159*    Assessment/Plan: S/P Procedure(s) (LRB): XI ROBOTIC ASSISTED THORASCOPY-RIGHT UPPER LOBE WEDGE RESECTION (Right) VIDEO BRONCHOSCOPY WITH ENDOBRONCHIAL NAVIGATION (N/A) INTERCOSTAL NERVE BLOCK (Right) NODE DISSECTION (Right) keep chest tube to WS  Repeat CXR in am   LOS: 2 days    Wonda Olds 02/21/2021

## 2021-02-21 NOTE — Plan of Care (Signed)
  Problem: Education: Goal: Knowledge of General Education information will improve Description: Including pain rating scale, medication(s)/side effects and non-pharmacologic comfort measures Outcome: Progressing   Problem: Health Behavior/Discharge Planning: Goal: Ability to manage health-related needs will improve Outcome: Progressing   Problem: Clinical Measurements: Goal: Ability to maintain clinical measurements within normal limits will improve Outcome: Progressing   Problem: Clinical Measurements: Goal: Diagnostic test results will improve Outcome: Progressing   Problem: Clinical Measurements: Goal: Respiratory complications will improve Outcome: Progressing   Problem: Activity: Goal: Risk for activity intolerance will decrease Outcome: Progressing   Problem: Nutrition: Goal: Adequate nutrition will be maintained Outcome: Progressing   Problem: Pain Managment: Goal: General experience of comfort will improve Outcome: Progressing   Problem: Skin Integrity: Goal: Risk for impaired skin integrity will decrease Outcome: Progressing   

## 2021-02-22 ENCOUNTER — Inpatient Hospital Stay (HOSPITAL_COMMUNITY): Payer: Medicare HMO

## 2021-02-22 LAB — GLUCOSE, CAPILLARY
Glucose-Capillary: 159 mg/dL — ABNORMAL HIGH (ref 70–99)
Glucose-Capillary: 188 mg/dL — ABNORMAL HIGH (ref 70–99)
Glucose-Capillary: 166 mg/dL — ABNORMAL HIGH (ref 70–99)
Glucose-Capillary: 208 mg/dL — ABNORMAL HIGH (ref 70–99)

## 2021-02-22 NOTE — Progress Notes (Signed)
3 Days Post-Op Procedure(s) (LRB): XI ROBOTIC ASSISTED THORASCOPY-RIGHT UPPER LOBE WEDGE RESECTION (Right) VIDEO BRONCHOSCOPY WITH ENDOBRONCHIAL NAVIGATION (N/A) INTERCOSTAL NERVE BLOCK (Right) NODE DISSECTION (Right) Subjective: No complaints  Objective: Vital signs in last 24 hours: Temp:  [97.9 F (36.6 C)-98.4 F (36.9 C)] 98.2 F (36.8 C) (05/15 0727) Pulse Rate:  [69-86] 86 (05/15 0727) Cardiac Rhythm: Normal sinus rhythm (05/15 0800) Resp:  [18-24] 18 (05/15 0727) BP: (116-132)/(67-81) 116/72 (05/15 0727) SpO2:  [94 %-98 %] 98 % (05/15 0727) Weight:  [74.8 kg] 74.8 kg (05/15 0300)  Hemodynamic parameters for last 24 hours:    Intake/Output from previous day: 05/14 0701 - 05/15 0700 In: 760 [P.O.:760] Out: 180 [Chest Tube:180] Intake/Output this shift: Total I/O In: 120 [P.O.:120] Out: -   General appearance: alert and cooperative Neurologic: intact Lungs: clear to auscultation bilaterally Wound: c/d/i  Lab Results: Recent Labs    02/20/21 0106 02/21/21 0043  WBC 15.1* 12.7*  HGB 11.9* 13.0  HCT 36.7 40.1  PLT 285 289   BMET:  Recent Labs    02/20/21 0106 02/21/21 0043  NA 138 140  K 4.4 4.3  CL 107 111  CO2 25 21*  GLUCOSE 134* 133*  BUN 17 15  CREATININE 0.66 0.67  CALCIUM 9.0 8.7*    PT/INR: No results for input(s): LABPROT, INR in the last 72 hours. ABG    Component Value Date/Time   PHART 7.415 02/17/2021 1403   HCO3 23.1 02/17/2021 1403   ACIDBASEDEF 0.8 02/17/2021 1403   O2SAT 97.2 02/17/2021 1403   CBG (last 3)  Recent Labs    02/21/21 1546 02/21/21 2104 02/22/21 0621  GLUCAP 170* 171* 159*    Assessment/Plan: S/P Procedure(s) (LRB): XI ROBOTIC ASSISTED THORASCOPY-RIGHT UPPER LOBE WEDGE RESECTION (Right) VIDEO BRONCHOSCOPY WITH ENDOBRONCHIAL NAVIGATION (N/A) INTERCOSTAL NERVE BLOCK (Right) NODE DISSECTION (Right) remove right chest tube --no air leak F/u CXR in am   LOS: 3 days    Wonda Olds 02/22/2021

## 2021-02-22 NOTE — Plan of Care (Signed)

## 2021-02-23 ENCOUNTER — Inpatient Hospital Stay (HOSPITAL_COMMUNITY): Payer: Medicare HMO

## 2021-02-23 DIAGNOSIS — J439 Emphysema, unspecified: Secondary | ICD-10-CM | POA: Diagnosis not present

## 2021-02-23 DIAGNOSIS — J9811 Atelectasis: Secondary | ICD-10-CM | POA: Diagnosis not present

## 2021-02-23 DIAGNOSIS — J939 Pneumothorax, unspecified: Secondary | ICD-10-CM | POA: Diagnosis not present

## 2021-02-23 LAB — GLUCOSE, CAPILLARY: Glucose-Capillary: 167 mg/dL — ABNORMAL HIGH (ref 70–99)

## 2021-02-23 MED ORDER — ACETAMINOPHEN 500 MG PO TABS: 1000.0000 mg | ORAL_TABLET | Freq: Four times a day (QID) | ORAL | 0 refills | Status: AC

## 2021-02-23 NOTE — Progress Notes (Signed)
4 Days Post-Op Procedure(s) (LRB): XI ROBOTIC ASSISTED THORASCOPY-RIGHT UPPER LOBE WEDGE RESECTION (Right) VIDEO BRONCHOSCOPY WITH ENDOBRONCHIAL NAVIGATION (N/A) INTERCOSTAL NERVE BLOCK (Right) NODE DISSECTION (Right) Subjective: She feels okay this morning without complaint.   Objective: Vital signs in last 24 hours: Temp:  [98.1 F (36.7 C)-98.4 F (36.9 C)] 98.1 F (36.7 C) (05/16 0442) Pulse Rate:  [80-90] 80 (05/16 0442) Cardiac Rhythm: Normal sinus rhythm (05/15 1923) Resp:  [16-20] 18 (05/16 0541) BP: (116-131)/(59-77) 117/62 (05/16 0442) SpO2:  [95 %-98 %] 96 % (05/16 0442)  Hemodynamic parameters for last 24 hours Intake/Output from previous day: 05/15 0701 - 05/16 0700 In: 340 [P.O.:340] Out: -  Intake/Output this shift: No intake/output data recorded.  General appearance: alert, cooperative and no distress Heart: regular rate and rhythm, S1, S2 normal, no murmur, click, rub or gallop Lungs: clear to auscultation bilaterally Abdomen: soft, non-tender; bowel sounds normal; no masses,  no organomegaly Extremities: extremities normal, atraumatic, no cyanosis or edema Wound: clean and dry  Lab Results: Recent Labs    02/21/21 0043  WBC 12.7*  HGB 13.0  HCT 40.1  PLT 289   BMET:  Recent Labs    02/21/21 0043  NA 140  K 4.3  CL 111  CO2 21*  GLUCOSE 133*  BUN 15  CREATININE 0.67  CALCIUM 8.7*    PT/INR: No results for input(s): LABPROT, INR in the last 72 hours. ABG    Component Value Date/Time   PHART 7.415 02/17/2021 1403   HCO3 23.1 02/17/2021 1403   ACIDBASEDEF 0.8 02/17/2021 1403   O2SAT 97.2 02/17/2021 1403   CBG (last 3)  Recent Labs    02/22/21 1622 02/22/21 2110 02/23/21 0618  GLUCAP 166* 208* 167*    Assessment/Plan: S/P Procedure(s) (LRB): XI ROBOTIC ASSISTED THORASCOPY-RIGHT UPPER LOBE WEDGE RESECTION (Right) VIDEO BRONCHOSCOPY WITH ENDOBRONCHIAL NAVIGATION (N/A) INTERCOSTAL NERVE BLOCK (Right) NODE DISSECTION  (Right)  1. CV-BP stable, HR 80s NSR.  2. Pulm- CXR this morning with tiny right pneumo and continued subq air around the chest wall and right neck. 3. Renal-creatinine 0.67, electrolytes okay 4. Endo-blood glucose with moderate control. Will restart oral diabetes meds today 5. Continue lovenox for DVT prophylaxis  Plan: Feels good this morning, she has been ambulating several times in the halls. She is pulling 2,000 on her IS. I will reviewed the CXR with Dr. Roxan Hockey but I think she can go home today. Her husband will be there to help care for her.    LOS: 4 days    [name] 02/23/2021

## 2021-02-23 NOTE — Discharge Instructions (Signed)
Robot-Assisted Thoracic Surgery    Robot-assisted thoracic surgery is a procedure in which robotic arms and surgical instruments are used to perform complex procedures through small incisions. During surgery, the surgeon sits at a console inside of the operating room and uses controls at the console to move the robotic arms. Surgical instruments are attached to the ends of the robotic arms. Instruments include a tool with a light and camera on the end of it (thoracoscope). The camera sends images to a video monitor that your surgeon will use to view the inside of your thoracic area. The thoracic area is between the neck and abdomen. Robot-assisted thoracic surgery may be done to:  Remove a tissue sample to be tested in a lab (biopsy).  Remove a part of a lung (lobectomy) or the whole lung (pneumonectomy).  Remove tumors.  Treat other conditions that affect: ? Your esophagus. This is the part of your body that carries food and liquid from your mouth to your stomach. ? Your diaphragm. This is a muscle wall between your lungs and stomach area. It helps with breathing.  Remove a portion of the nerves (sympathectomy) that cause excessive sweating. Tell a health care provider about:  Any allergies you have. This is especially important if you have allergies to medicines or sedatives.  All medicines you are taking, including vitamins, herbs, eye drops, creams, and over-the-counter medicines.  Any problems you or family members have had with anesthetic medicines.  Any blood disorders you have.  Any surgeries you have had.  Any medical conditions you have.  Whether you are pregnant or may be pregnant. What are the risks? Generally, this is a safe procedure. However, problems may occur, including:  Infection, such as pneumonia.  Severe bleeding (hemorrhage).  Allergic reactions to medicines.  Damage to organs or structures such as nerves or blood vessels. What happens before the  procedure? Staying hydrated Follow instructions from your health care provider about hydration, which may include:  Up to 2 hours before the procedure - you may continue to drink clear liquids, such as water, clear fruit juice, black coffee, and plain tea. Eating and drinking restrictions Follow instructions from your health care provider about eating and drinking, which may include:  8 hours before the procedure - stop eating heavy meals or foods, such as meat, fried foods, or fatty foods.  6 hours before the procedure - stop eating light meals or foods, such as toast or cereal.  6 hours before the procedure - stop drinking milk or drinks that contain milk.  2 hours before the procedure - stop drinking clear liquids. Medicines  Ask your health care provider about: ? Changing or stopping your regular medicines. This is especially important if you are taking diabetes medicines or blood thinners. ? Taking medicines such as aspirin and ibuprofen. These medicines can thin your blood. Do not take these medicines unless your health care provider tells you to take them. ? Taking over-the-counter medicines, vitamins, herbs, and supplements.  Talk with your health care provider about safe and effective ways to manage pain before and after your procedure. Pain management should fit your specific health needs. Tests  You may have tests, such as: ? CT scan. ? Ultrasound. ? Chest X-ray. ? Electrocardiogram (ECG).  You may have a blood or urine sample taken. General instructions  Ask your health care provider: ? How your surgery site will be marked. ? What steps will be taken to help prevent infection. These steps may include:  Removing hair at the surgery site.  Washing skin with a germ-killing soap.  Receiving antibiotic medicine.  Do not use any products that contain nicotine or tobacco for at least 4 weeks before the procedure. These products include cigarettes, chewing tobacco, and  vaping devices, such as e-cigarettes. If you need help quitting, ask your health care provider.  Plan to have a responsible adult take you home from the hospital or clinic.  Plan to have a responsible adult care for you for the time you are told after you leave the hospital or clinic. This is important. What happens during the procedure?  An IV will be inserted into one of your veins.  You will be given one or more of the following: ? A medicine to help you relax (sedative). ? A medicine to make you fall asleep (general anesthetic). ? A medicine that is injected into an area of your body to numb everything below the injection site (regional anesthetic).  One to four small incisions will be made. The number of incisions and the area where incisions are made will depend on the purpose of your procedure.  A surgical assistant will then place instruments that are attached to the robotic arms into your thoracic cavity through these small incisions. The robotic arms are equipped with different surgical instruments and a high-definition 3D camera.  The surgeon will sit at a control console and see a magnified, high-resolution 3D image of the surgical field on a monitor. The surgeon will use master controls from the console that respond to the surgeon's movements in real time.  The computer-assisted robot will translate the surgeon's hand, wrist, or finger movements into precise movements of the four robotic arms so that the necessary procedures can be performed.  A drainage tube may be placed to drain excess fluid from the chest cavity.  Your incisions will be closed with stitches (sutures) or staples and covered with a bandage (dressing). The procedure may vary among health care providers and hospitals. What happens after the procedure?  Your blood pressure, heart rate, breathing rate, and blood oxygen level will be monitored until you leave the hospital or clinic.  You may have a drainage  tube in place. It may stay in place for a few days after the procedure to monitor for signs of air or fluid buildup in the chest cavity. Summary  Robot-assisted thoracic surgery is a procedure in which robotic arms and surgical instruments are used to help perform complex procedures through small incisions. During surgery, the surgeon sits at a console in the operating room and uses controls at the console to move the robotic arms.  A drainage tube may be placed to drain excess fluid from the chest cavity. The tube will be closely monitored for fluid or air buildup in the chest cavity. This information is not intended to replace advice given to you by your health care provider. Make sure you discuss any questions you have with your health care provider. Document Revised: 06/23/2020 Document Reviewed: 06/23/2020 Elsevier Patient Education  2021 [employer].

## 2021-02-23 NOTE — Progress Notes (Signed)
Patient discharge instructions reviewed and follow up appointments discussed. Patient verbalized understanding.  Written copy given to patient.  Patient via wheelchair in stable condition to spouses waiting car.

## 2021-02-23 NOTE — Plan of Care (Signed)

## 2021-02-26 LAB — SURGICAL PATHOLOGY

## 2021-03-02 LAB — ALDOSTERONE + RENIN ACTIVITY W/ RATIO
ALDOS/RENIN RATIO: 0.3 (ref 0.0–30.0)
ALDOSTERONE: 4.3 ng/dL (ref 0.0–30.0)
Renin: 13.839 ng/mL/hr — ABNORMAL HIGH (ref 0.167–5.380)

## 2021-03-03 DIAGNOSIS — G894 Chronic pain syndrome: Secondary | ICD-10-CM | POA: Diagnosis not present

## 2021-03-03 DIAGNOSIS — I10 Essential (primary) hypertension: Secondary | ICD-10-CM | POA: Diagnosis not present

## 2021-03-04 ENCOUNTER — Other Ambulatory Visit: Payer: Self-pay | Admitting: Thoracic Surgery (Cardiothoracic Vascular Surgery)

## 2021-03-04 DIAGNOSIS — C3490 Malignant neoplasm of unspecified part of unspecified bronchus or lung: Secondary | ICD-10-CM

## 2021-03-05 ENCOUNTER — Other Ambulatory Visit: Payer: Self-pay | Admitting: *Deleted

## 2021-03-05 NOTE — Progress Notes (Signed)
The proposed treatment discussed in cancer conference is for discussion purpose only and is not a binding recommendation. The patient was not physically examined nor present for their treatment options. Therefore, final treatment plans cannot be decided.  ?

## 2021-03-10 ENCOUNTER — Ambulatory Visit (INDEPENDENT_AMBULATORY_CARE_PROVIDER_SITE_OTHER): Payer: Self-pay | Admitting: Thoracic Surgery (Cardiothoracic Vascular Surgery)

## 2021-03-10 ENCOUNTER — Other Ambulatory Visit: Payer: Self-pay

## 2021-03-10 ENCOUNTER — Encounter: Payer: Self-pay | Admitting: Thoracic Surgery (Cardiothoracic Vascular Surgery)

## 2021-03-10 ENCOUNTER — Ambulatory Visit
Admission: RE | Admit: 2021-03-10 | Discharge: 2021-03-10 | Disposition: A | Discharge status: Home / Self Care | Payer: Medicare HMO | Source: Ambulatory Visit | Attending: Thoracic Surgery (Cardiothoracic Vascular Surgery) | Admitting: Thoracic Surgery (Cardiothoracic Vascular Surgery)

## 2021-03-10 VITALS — BP 112/69 | HR 74 | Resp 20 | Ht 65.0 in | Wt 170.6 lb

## 2021-03-10 DIAGNOSIS — Z9889 Other specified postprocedural states: Secondary | ICD-10-CM

## 2021-03-10 DIAGNOSIS — C3490 Malignant neoplasm of unspecified part of unspecified bronchus or lung: Secondary | ICD-10-CM

## 2021-03-10 DIAGNOSIS — Z9049 Acquired absence of other specified parts of digestive tract: Secondary | ICD-10-CM | POA: Diagnosis not present

## 2021-03-10 DIAGNOSIS — J939 Pneumothorax, unspecified: Secondary | ICD-10-CM | POA: Diagnosis not present

## 2021-03-10 NOTE — Progress Notes (Signed)
[address]       [place] [address]             [phone]     HPI: Mrs. Grieser returns for a scheduled postoperative follow-up visit  Joslyne Curvin is a 55 year old woman with a history of tobacco abuse, COPD, stage Ia adenocarcinoma status post left upper lobectomy in 2006, type 2 insulin-dependent diabetes, hypertension, CAD, thoracic aortic atherosclerosis, thoracic aortic stent graft, reflux, arthritis, hypothyroidism, and anxiety.  She has about a 50-pack-year history of smoking.  She had a left upper lobectomy by Dr. Arlyce Dice in 2006.  She was found to have a nodule in the right upper lobe back in 2016.  Recently had a follow-up CT which showed a 1.5 x 2.4 cm mixed density lesion that had slowly increased in size over time.  On PET CT there was minimal activity.  I did a robotic right upper lobe wedge resection and node sampling on 02/19/2021.  She had an air leak initially but went home on day 4.  From a surgical standpoint she is doing well.  Her pain is minimal at this point.  She is not had any respiratory issues.  She has not smoked since she left the hospital.  Past Medical History:  Diagnosis Date  . Adenocarcinoma of lung ([place]) 02/06/2014  . Anxiety   . Arthritis   . COPD (chronic obstructive pulmonary disease) ([place])   . Coronary artery disease   . Diabetes mellitus   . GERD (gastroesophageal reflux disease)   . Hypertension   . Hypothyroidism   . Lung cancer [facility])     Current Outpatient Medications  Medication Sig Dispense Refill  . acetaminophen (TYLENOL) 500 MG tablet Take 2 tablets (1,000 mg total) by mouth every 6 (six) hours. 30 tablet 0  . albuterol (PROVENTIL) (2.5 MG/3ML) 0.083% nebulizer solution Take 2.5 mg by nebulization every 6 (six) hours as needed for shortness of breath or wheezing.    Marland Kitchen albuterol-ipratropium (COMBIVENT) 18-103 MCG/ACT inhaler Inhale 2 puffs into the lungs every 6 (six) hours as needed for wheezing or shortness  of breath. 1 Inhaler 1  . ALPRAZolam (XANAX) 0.5 MG tablet Take 0.5 mg by mouth at bedtime.    Marland Kitchen aspirin EC 81 MG tablet Take 81 mg by mouth daily.    Marland Kitchen atorvastatin (LIPITOR) 80 MG tablet Take 1 tablet (80 mg total) by mouth daily at 6 PM. 30 tablet 6  . ELIQUIS 5 MG TABS tablet TAKE 1 TABLET(5 MG) BY MOUTH TWICE DAILY (Patient taking differently: Take 5 mg by mouth 2 (two) times daily.) 60 tablet 5  . empagliflozin (JARDIANCE) 25 MG TABS tablet Take 1 tablet (25 mg total) by mouth daily before breakfast. 90 tablet 3  . esomeprazole (NEXIUM) 40 MG capsule Take 1 capsule (40 mg total) by mouth daily before breakfast. 30 capsule 3  . ezetimibe (ZETIA) 10 MG tablet Take 10 mg by mouth daily.    . insulin aspart protamine - aspart (NOVOLOG MIX 70/30 FLEXPEN) (70-30) 100 UNIT/ML FlexPen Inject 0.22 mLs (22 Units total) into the skin daily with breakfast AND 0.28 mLs (28 Units total) daily with supper. 45 mL 3  . Insulin Pen Needle (BD PEN NEEDLE NANO 2ND GEN) 32G X 4 MM MISC Inject 1 Device into the skin 2 (two) times daily. 100 each 11  . latanoprost (XALATAN) 0.005 % ophthalmic solution Place 1 drop into both eyes at bedtime.    Marland Kitchen  levothyroxine (SYNTHROID) 50 MCG tablet Take 1 tablet (50 mcg total) by mouth daily. 30 tablet 3  . metFORMIN (GLUCOPHAGE) 500 MG tablet Take 1 tablet (500 mg total) by mouth 2 (two) times daily with a meal. 180 tablet 3  . ONETOUCH ULTRA test strip TEST 7 TIMES DAILY AS DIRECTED 500 strip 2  . oxyCODONE (ROXICODONE) 15 MG immediate release tablet Take 15 mg by mouth every 6 (six) hours as needed for pain.    Marland Kitchen zolpidem (AMBIEN) 10 MG tablet Take 10 mg by mouth at bedtime.     No current facility-administered medications for this visit.    Physical Exam BP 112/69 (BP Location: Left Arm, Patient Position: Sitting, Cuff Size: Normal)   Pulse 74   Resp 20   Ht 5\' 5"  (1.651 m)   Wt 170 lb 9.6 oz (77.4 kg)   SpO2 97% Comment: RA  BMI 28.21 kg/m  55 year old woman in  no acute distress Alert and oriented x3 with no focal deficits Lungs clear with equal breath sounds bilaterally Incisions clean dry and intact No peripheral edema  Diagnostic Tests: I personally reviewed her chest x-ray.  Shows good aeration of the lungs bilaterally.  Resolved subcutaneous emphysema.  Impression: Jakaylee Sasaki is a 55 year old woman with a history of tobacco abuse, COPD, stage Ia adenocarcinoma status post left upper lobectomy in 2006, type 2 insulin-dependent diabetes, hypertension, CAD, thoracic aortic atherosclerosis, thoracic aortic stent graft, reflux, arthritis, hypothyroidism, and anxiety.   She was found to have a right upper lobe nodule in 2016.  This was observed over time.  It has very slowly gotten slightly larger and a little more mixed density.  I did a wedge resection and it turned out to be a stage Ia adenocarcinoma with an 8 mm invasive component.  Nodes were negative.  She is doing well at this time.  She is not having much pain anymore.  She is not had any respiratory issues.  She has an appointment with Dr. Delton Coombes in the next week or 2.  At this point there are no restrictions on her activities.  She was advised to build into new activities gradually to avoid undue discomfort.  I congratulated her for not smoking and emphasized the importance of continued abstinence.  Plan: Follow-up as scheduled with Dr. Delton Coombes Return in 2 months with PA lateral chest x-ray  Melrose Nakayama, MD [facility] [phone]

## 2021-03-24 DIAGNOSIS — G894 Chronic pain syndrome: Secondary | ICD-10-CM | POA: Diagnosis not present

## 2021-03-24 DIAGNOSIS — J329 Chronic sinusitis, unspecified: Secondary | ICD-10-CM | POA: Diagnosis not present

## 2021-03-31 ENCOUNTER — Ambulatory Visit (HOSPITAL_COMMUNITY): Payer: Medicare HMO | Admitting: Hematology and Oncology

## 2021-05-11 ENCOUNTER — Other Ambulatory Visit: Payer: Self-pay | Admitting: Thoracic Surgery (Cardiothoracic Vascular Surgery)

## 2021-05-11 DIAGNOSIS — C3490 Malignant neoplasm of unspecified part of unspecified bronchus or lung: Secondary | ICD-10-CM

## 2021-05-12 ENCOUNTER — Encounter: Payer: Self-pay | Admitting: Thoracic Surgery (Cardiothoracic Vascular Surgery)

## 2021-06-26 ENCOUNTER — Other Ambulatory Visit: Payer: Self-pay

## 2021-06-26 ENCOUNTER — Encounter: Payer: Self-pay | Admitting: Internal Medicine

## 2021-06-26 ENCOUNTER — Ambulatory Visit (INDEPENDENT_AMBULATORY_CARE_PROVIDER_SITE_OTHER): Payer: Medicare HMO | Admitting: Internal Medicine

## 2021-06-26 VITALS — BP 122/74 | HR 91 | Ht 65.0 in | Wt 172.6 lb

## 2021-06-26 DIAGNOSIS — E876 Hypokalemia: Secondary | ICD-10-CM

## 2021-06-26 DIAGNOSIS — Z794 Long term (current) use of insulin: Secondary | ICD-10-CM

## 2021-06-26 DIAGNOSIS — E1165 Type 2 diabetes mellitus with hyperglycemia: Secondary | ICD-10-CM | POA: Diagnosis not present

## 2021-06-26 LAB — POCT GLYCOSYLATED HEMOGLOBIN (HGB A1C): Hemoglobin A1C: 8.5 % — AB (ref 4.0–5.6)

## 2021-06-26 MED ORDER — TRESIBA FLEXTOUCH 100 UNIT/ML ~~LOC~~ SOPN: 22.0000 [IU] | PEN_INJECTOR | Freq: Every day | SUBCUTANEOUS | 11 refills | Status: DC

## 2021-06-26 MED ORDER — TRULICITY 0.75 MG/0.5ML ~~LOC~~ SOAJ: 0.7500 mg | SUBCUTANEOUS | 3 refills | Status: DC

## 2021-06-26 MED ORDER — DEXCOM G6 SENSOR MISC: 1.0000 | 3 refills | Status: DC

## 2021-06-26 MED ORDER — DEXCOM G6 TRANSMITTER MISC
1.0000 | 3 refills | Status: DC
Start: 2021-06-26 — End: 2022-02-01

## 2021-06-26 MED ORDER — EMPAGLIFLOZIN 25 MG PO TABS: 25.0000 mg | ORAL_TABLET | Freq: Every day | ORAL | 3 refills | Status: DC

## 2021-06-26 MED ORDER — METFORMIN HCL 500 MG PO TABS: 500.0000 mg | ORAL_TABLET | Freq: Two times a day (BID) | ORAL | 3 refills | Status: DC

## 2021-06-26 NOTE — Patient Instructions (Addendum)
-   Metformin ONE tablet with Breakfast and ONE tablet with Supper - Jardiance 25 mg with breakfast   - Stop Novolog Mix  - Start Tresiba 22 units once daily  - Start Trulicity 6.70 mg ONCE a week   -HOW TO TREAT LOW BLOOD SUGARS (Blood sugar LESS THAN 70 MG/DL) Please follow the RULE OF 15 for the treatment of hypoglycemia treatment (when your (blood sugars are less than 70 mg/dL)   STEP 1: Take 15 grams of carbohydrates when your blood sugar is low, which includes:  3-4 GLUCOSE TABS  OR 3-4 OZ OF JUICE OR REGULAR SODA OR ONE TUBE OF GLUCOSE GEL    STEP 2: RECHECK blood sugar in 15 MINUTES STEP 3: If your blood sugar is still low at the 15 minute recheck --> then, go back to STEP 1 and treat AGAIN with another 15 grams of carbohydrates.    24-Hour Urine Collection  You will be collecting your urine for a 24-hour period of time. Your timer starts with your first urine of the morning (For example - If you first pee at [place], your timer will start at [place]) [place] away your first urine of the morning Collect your urine every time you pee for the next 24 hours STOP your urine collection 24 hours after you started the collection (For example - You would stop at 9AM the day after you started)

## 2021-06-26 NOTE — Progress Notes (Signed)
Name: Marissa [name]  Age/ Sex: 55 y.o., female   MRN/ DOB: [id number], 1966/10/08     PCP: Redmond School, MD   Reason for Endocrinology Evaluation: Type 2 Diabetes Mellitus  Initial Endocrine Consultative Visit: 06/04/2019    PATIENT IDENTIFIER: [name] is a 55 y.o. female with a past medical history of HTN,T2DM, Dyslipidemia and Lung Ca (2006) . The patient has followed with Endocrinology clinic since 06/03/2001 for consultative assistance with management of her diabetes.  DIABETIC HISTORY:  Marissa [name] was diagnosed with T2DM in 2006. She has been on oral glycemic agents in the past ( Glipizide, glimepiride, metformin, Farxiga ( not covered) , insulin added in 01/2019. Her hemoglobin A1c has ranged from 6.7% in 2015, peaking at 13.8% in 02/2019.   On her initial visit to our clinic her A1c was 10.8%  , she was on Glimepiride, Metformin and Levemir. We stopped Levemir, and Glimepiride , started insulin Mix and reduced metformin dose due to nausea and diarrhea.  Jardiance started 10/2019  SUBJECTIVE:   During the last visit (02/16/2021): A1c 7.7 %. We continued metformin,  and Jardiance and increased  insulin mix   Today (06/26/2021): Marissa [name] is here for a follow up on diabetes management. She checks her blood sugars 1-2 times daily, preprandial to breakfast and supper. The patient has not had hypoglycemic episodes since the last clinic visit.    She is S/P right upper lobe wedge resection for adenocarcinoma 02/2021 for Stage Ia adenocarcinoma with an 8 mm invasive component, negative L.N   Denies cough or sob    HOME DIABETES REGIMEN:  Metformin 500 mg ,1 tablet BID  Novolog Mix 22 units with Breakfast and 28 units with Supper  Jardiance 25 mg daily    METER DOWNLOAD SUMMARY: 8/18-9/16/2022  Average Number Tests/Day = 1.5 Overall Mean FS Glucose = 162 Standard Deviation = 34  BG Ranges: Low = 95 High = 269    Hypoglycemic Events/30 Days: BG < 50 =  0 Episodes of symptomatic severe hypoglycemia = 0       DIABETIC COMPLICATIONS: Microvascular complications:  Neuropathy , glaucoma  Denies: CKD , retinopathy  Last eye exam: Completed 2021   Macrovascular complications:  CAD ( S/P PCI )  Denies:  PVD, CVA   HISTORY:  Past Medical History:  Past Medical History:  Diagnosis Date   Adenocarcinoma of lung ([place]) 02/06/2014   Anxiety    Arthritis    COPD (chronic obstructive pulmonary disease) ([place])    Coronary artery disease    Diabetes mellitus    GERD (gastroesophageal reflux disease)    Hypertension    Hypothyroidism    Lung cancer ([place])    Past Surgical History:  Past Surgical History:  Procedure Laterality Date   APPENDECTOMY     CHOLECYSTECTOMY     INTERCOSTAL NERVE BLOCK Right 02/19/2021   Procedure: INTERCOSTAL NERVE BLOCK;  Surgeon: Melrose Nakayama, MD;  Location: [facility];  Service: Thoracic;  Laterality: Right;   LOBECTOMY Left 2006   NM MYOCAR IMG MI  05/2008   lexiscan -perfusion defect in anterior myocardium (breast attenuation); remaining myocardium with NO evidence of ischemia or infarct; EF 78%; low risk scan   NODE DISSECTION Right 02/19/2021   Procedure: NODE DISSECTION;  Surgeon: Melrose Nakayama, MD;  [facility];  Service: Thoracic;  Laterality: Right;   OOPHORECTOMY Left    PARTIAL HYSTERECTOMY  1985   SALPINGOOPHORECTOMY Left    benign 9lb  mass removed and a massive wall of smaller tumors removed   THORACIC AORTIC ENDOVASCULAR STENT GRAFT N/A 06/04/2019   Procedure: THORACIC AORTIC ENDOVASCULAR STENT GRAFT;  Surgeon: Angelia Mould, MD;  Location: [facility];  Service: Vascular;  Laterality: N/A;   TRANSTHORACIC ECHOCARDIOGRAM  05/2008   EF =/>55%, mild MR; trace TR   VIDEO BRONCHOSCOPY WITH ENDOBRONCHIAL NAVIGATION N/A 02/19/2021   Procedure: VIDEO BRONCHOSCOPY WITH ENDOBRONCHIAL NAVIGATION;  Surgeon: Melrose Nakayama, MD;  Location: [facility];  Service: Thoracic;  Laterality: N/A;    Social History:  reports that she quit smoking about 4 months ago. Her smoking use included cigarettes. She has a 16.00 pack-year smoking history. She has never used smokeless tobacco. She reports that she does not drink alcohol and does not use drugs. Family History:  Family History  Problem Relation Age of Onset   Colon cancer Mother    Lung cancer Mother    Heart disease Mother    Heart disease Father    Ovarian cancer Sister    Hepatitis Brother    Hernia Brother      HOME MEDICATIONS: Allergies as of 06/26/2021       Reactions   Bee Venom Anaphylaxis   Penicillins Swelling, Rash, Other (See Comments)   Did it involve swelling of the face/tongue/throat, SOB, or low BP? Yes-whole body (swelling) Did it involve sudden or severe rash/hives, skin peeling, or any reaction on the inside of your mouth or nose? Yes-Hives Did you need to seek medical attention at a hospital or doctor's office? Yes When did it last happen?  Childhood     If all above answers are "NO", may proceed with cephalosporin use.   Poison Oak Extract Anaphylaxis        Medication List        Accurate as of June 26, 2021  1:32 PM. If you have any questions, ask your nurse or doctor.          STOP taking these medications    NovoLOG Mix 70/30 FlexPen (70-30) 100 UNIT/ML FlexPen Generic drug: insulin aspart protamine - aspart Stopped by: Dorita Sciara, MD       TAKE these medications    acetaminophen 500 MG tablet Commonly known as: TYLENOL Take 2 tablets (1,000 mg total) by mouth every 6 (six) hours.   albuterol (2.5 MG/3ML) 0.083% nebulizer solution Commonly known as: PROVENTIL Take 2.5 mg by nebulization every 6 (six) hours as needed for shortness of breath or wheezing.   albuterol-ipratropium 18-103 MCG/ACT inhaler Commonly known as: COMBIVENT Inhale 2 puffs into the lungs every 6 (six) hours as needed for wheezing or shortness of breath.   ALPRAZolam 0.5 MG  tablet Commonly known as: XANAX Take 0.5 mg by mouth at bedtime.   aspirin EC 81 MG tablet Take 81 mg by mouth daily.   atorvastatin 80 MG tablet Commonly known as: LIPITOR Take 1 tablet (80 mg total) by mouth daily at 6 PM.   BD Pen Needle Nano 2nd Gen 32G X 4 MM Misc Generic drug: Insulin Pen Needle Inject 1 Device into the skin 2 (two) times daily.   Dexcom G6 Sensor Misc 1 Device by Does not apply route as directed. Started by: Dorita Sciara, MD   Dexcom G6 Transmitter Misc 1 Device by Does not apply route as directed. Started by: Dorita Sciara, MD   Eliquis 5 MG Tabs tablet Generic drug: apixaban TAKE 1 TABLET(5 MG) BY MOUTH TWICE DAILY What changed:  See the new instructions.   empagliflozin 25 MG Tabs tablet Commonly known as: Jardiance Take 1 tablet (25 mg total) by mouth daily before breakfast.   esomeprazole 40 MG capsule Commonly known as: NEXIUM Take 1 capsule (40 mg total) by mouth daily before breakfast.   ezetimibe 10 MG tablet Commonly known as: ZETIA Take 10 mg by mouth daily.   latanoprost 0.005 % ophthalmic solution Commonly known as: XALATAN Place 1 drop into both eyes at bedtime.   levothyroxine 50 MCG tablet Commonly known as: SYNTHROID Take 1 tablet (50 mcg total) by mouth daily.   metFORMIN 500 MG tablet Commonly known as: Glucophage Take 1 tablet (500 mg total) by mouth 2 (two) times daily with a meal.   OneTouch Ultra test strip Generic drug: glucose blood TEST 7 TIMES DAILY AS DIRECTED   oxyCODONE 15 MG immediate release tablet Commonly known as: ROXICODONE Take 15 mg by mouth every 6 (six) hours as needed for pain.   Tyler Aas FlexTouch 100 UNIT/ML FlexTouch Pen Generic drug: insulin degludec Inject 22 Units into the skin daily. Started by: Dorita Sciara, MD   Trulicity 3.14 HF/0.2OV Sopn Generic drug: Dulaglutide Inject 0.75 mg into the skin once a week. Started by: Dorita Sciara, MD   zolpidem  10 MG tablet Commonly known as: AMBIEN Take 10 mg by mouth at bedtime.         OBJECTIVE:   Vital Signs: BP 122/74 (BP Location: Left Arm, Patient Position: Sitting, Cuff Size: Small)   Pulse 91   Ht 5\' 5"  (1.651 m)   Wt 172 lb 9.6 oz (78.3 kg)   SpO2 95%   BMI 28.72 kg/m   Wt Readings from Last 3 Encounters:  06/26/21 172 lb 9.6 oz (78.3 kg)  03/10/21 170 lb 9.6 oz (77.4 kg)  02/22/21 164 lb 14.5 oz (74.8 kg)     Exam: General: Pt appears well and is in NAD  Lungs: Clear with good BS bilat with no rales, rhonchi, or wheezes  Heart: RRR with normal S1 and S2 and no gallops; no murmurs; no rub  Abdomen: Normoactive bowel sounds, soft, nontender  Extremities: No pretibial edema.   Neuro: MS is good with appropriate affect, pt is alert and Ox3       DM foot exam: 06/26/2021   The skin of the feet is intact without sores or ulcerations. The pedal pulses are 2+ on right and 2+ on left. The sensation is intact to a screening 5.07, 10 gram monofilament bilaterally    DATA REVIEWED:  Lab Results  Component Value Date   HGBA1C 8.5 (A) 06/26/2021   HGBA1C 7.7 (A) 02/16/2021   HGBA1C 8.4 (A) 09/26/2020   Results for Marissa, [name] (MRN [id number]) as of 02/17/2021 13:20  Ref. Range 02/16/2021 16:17  Sodium Latest Ref Range: 135 - 145 mEq/L 138  Potassium Latest Ref Range: 3.5 - 5.1 mEq/L 4.3  Chloride Latest Ref Range: 96 - 112 mEq/L 103  CO2 Latest Ref Range: 19 - 32 mEq/L 23  Glucose Latest Ref Range: 70 - 99 mg/dL 110 (H)  BUN Latest Ref Range: 6 - 23 mg/dL 13  Creatinine Latest Ref Range: 0.40 - 1.20 mg/dL 0.62  Calcium Latest Ref Range: 8.4 - 10.5 mg/dL 9.7  GFR Latest Ref Range: >60.00 mL/min 99.84  MICROALB/CREAT RATIO Latest Ref Range: 0.0 - 30.0 mg/g 2.2  TSH Latest Ref Range: 0.35 - 4.50 uIU/mL 1.46  T4,Free(Direct) Latest Ref Range: 0.60 - 1.60 ng/dL  1.01  Creatinine,U Latest Units: mg/dL 37.9  Microalb, Ur Latest Ref Range: 0.0 - 1.9 mg/dL 0.8      ASSESSMENT / PLAN / RECOMMENDATIONS:   1) Type 2 Diabetes Mellitus,Poorly controlled, With Neuropathic and Macrovascular  complications - Most recent A1c of 8.5 %. Goal A1c < 7.0 %.   - Pt with worsening glycemic control, she admits to taking insulin mix without meals , she also admits to sometimes taking the insulin after eating , if she eats out. She also admits to eating more while on insulin due to fear of hypoglycemia. She has never had a severe episode of hypoglycemia but a family member of hers has  - Will prescribe dexcom  - We discussed stopped insulin mix and starting basal insulin and GLP-1 agonists, no hx of pancreatitis or medullary cancer  - Cautioned about GI side effects with GLP-1 agonists    MEDICATIONS: Continue Metformin 500 mg BID Continue Jardiance 25 mg daily  Stop Insulin Mix  Start Tresiba 22 units daily  Start Trulicity 2.95 mg weekly   EDUCATION / INSTRUCTIONS: BG monitoring instructions: Patient is instructed to check her blood sugars 2 times a day, breakfast and supper. Call [facility] if: BG persistently < 70  I reviewed the Rule of 15 for the treatment of hypoglycemia in detail with the patient. Literature supplied.   2) Hypokalemia :  - She has been noted with spontaneous hypokalemia. She is not on any diuretics. Her also was normal and renin elevated. . - We held on cushing syndrome screen until after her sx , will proceed with this now. She was provided with orders through [facility] to do it locally       F/U in 3 months    Signed electronically by: Mack Guise, MD  [facility] Endocrinology  [facility] [address] [place], [place] [address] Phone: [phone] FAX: [phone]   CC: [address], [place] [address] Phone: [phone]  Fax: [phone]  Return to Endocrinology clinic as below: Future Appointments  Date Time Provider  [place]  09/25/2021  1:40 PM Artemio Dobie, Melanie Crazier, MD [facility] None

## 2021-07-03 DIAGNOSIS — K219 Gastro-esophageal reflux disease without esophagitis: Secondary | ICD-10-CM | POA: Diagnosis not present

## 2021-07-03 DIAGNOSIS — J449 Chronic obstructive pulmonary disease, unspecified: Secondary | ICD-10-CM | POA: Diagnosis not present

## 2021-07-03 DIAGNOSIS — E1169 Type 2 diabetes mellitus with other specified complication: Secondary | ICD-10-CM | POA: Diagnosis not present

## 2021-07-03 DIAGNOSIS — Z1389 Encounter for screening for other disorder: Secondary | ICD-10-CM | POA: Diagnosis not present

## 2021-07-03 DIAGNOSIS — E559 Vitamin D deficiency, unspecified: Secondary | ICD-10-CM | POA: Diagnosis not present

## 2021-07-03 DIAGNOSIS — G4700 Insomnia, unspecified: Secondary | ICD-10-CM | POA: Diagnosis not present

## 2021-07-03 DIAGNOSIS — E785 Hyperlipidemia, unspecified: Secondary | ICD-10-CM | POA: Diagnosis not present

## 2021-07-03 DIAGNOSIS — E538 Deficiency of other specified B group vitamins: Secondary | ICD-10-CM | POA: Diagnosis not present

## 2021-07-03 DIAGNOSIS — R69 Illness, unspecified: Secondary | ICD-10-CM | POA: Diagnosis not present

## 2021-07-03 DIAGNOSIS — C3490 Malignant neoplasm of unspecified part of unspecified bronchus or lung: Secondary | ICD-10-CM | POA: Diagnosis not present

## 2021-07-03 DIAGNOSIS — E782 Mixed hyperlipidemia: Secondary | ICD-10-CM | POA: Diagnosis not present

## 2021-07-03 DIAGNOSIS — I10 Essential (primary) hypertension: Secondary | ICD-10-CM | POA: Diagnosis not present

## 2021-07-03 DIAGNOSIS — F419 Anxiety disorder, unspecified: Secondary | ICD-10-CM | POA: Diagnosis not present

## 2021-07-03 DIAGNOSIS — E039 Hypothyroidism, unspecified: Secondary | ICD-10-CM | POA: Diagnosis not present

## 2021-07-03 DIAGNOSIS — J45909 Unspecified asthma, uncomplicated: Secondary | ICD-10-CM | POA: Diagnosis not present

## 2021-07-03 DIAGNOSIS — G894 Chronic pain syndrome: Secondary | ICD-10-CM | POA: Diagnosis not present

## 2021-07-03 DIAGNOSIS — E663 Overweight: Secondary | ICD-10-CM | POA: Diagnosis not present

## 2021-07-06 ENCOUNTER — Other Ambulatory Visit: Payer: Self-pay | Admitting: Internal Medicine

## 2021-07-06 DIAGNOSIS — E876 Hypokalemia: Secondary | ICD-10-CM | POA: Diagnosis not present

## 2021-07-10 ENCOUNTER — Encounter: Payer: Self-pay | Admitting: Internal Medicine

## 2021-07-11 DIAGNOSIS — Z20822 Contact with and (suspected) exposure to covid-19: Secondary | ICD-10-CM | POA: Diagnosis not present

## 2021-07-11 LAB — CREATININE, URINE, 24 HOUR
Creatinine, 24H Ur: 1037 mg/24 hr (ref 800–1800)
Creatinine, Urine: 50.6 mg/dL

## 2021-07-11 LAB — CORTISOL, URINE, FREE
Cortisol (Ur), Free: 64 ug/24 hr — ABNORMAL HIGH (ref 6–42)
Cortisol,F,ug/L,U: 31 ug/L

## 2021-07-15 ENCOUNTER — Other Ambulatory Visit: Payer: Self-pay

## 2021-07-15 ENCOUNTER — Telehealth: Payer: Self-pay | Admitting: Internal Medicine

## 2021-07-15 DIAGNOSIS — Z794 Long term (current) use of insulin: Secondary | ICD-10-CM

## 2021-07-15 MED ORDER — DEXAMETHASONE 1 MG PO TABS: 1.0000 mg | ORAL_TABLET | Freq: Once | ORAL | 0 refills | Status: AC

## 2021-07-15 NOTE — Telephone Encounter (Addendum)
Discussed abnormal 24-hr urine cortisol.    Pt is not sure she collected the sample correct as she had issues with the orders.  Unable to proceed with salivary cortisol as she doesn't go to bed until 2 Am  We discussed proceeding with dex. Suppression test     Instructions sent through my portal   She is having nausea/Vomiting with trulicity  1.68 mg weekly . Pt to discontinue

## 2021-07-15 NOTE — Telephone Encounter (Signed)
Patient returned provider call - provider in clinic and unable to speak at the time of the call.  Patient requested a call back to [phone]

## 2021-07-16 NOTE — Addendum Note (Signed)
Addended by: Dorita Sciara on: 07/16/2021 07:32 AM   Modules accepted: Orders

## 2021-07-23 DIAGNOSIS — Z794 Long term (current) use of insulin: Secondary | ICD-10-CM | POA: Diagnosis not present

## 2021-07-23 DIAGNOSIS — E1165 Type 2 diabetes mellitus with hyperglycemia: Secondary | ICD-10-CM | POA: Diagnosis not present

## 2021-07-24 LAB — CORTISOL: Cortisol: 1.5 ug/dL

## 2021-07-31 DIAGNOSIS — G4700 Insomnia, unspecified: Secondary | ICD-10-CM | POA: Diagnosis not present

## 2021-07-31 DIAGNOSIS — E1169 Type 2 diabetes mellitus with other specified complication: Secondary | ICD-10-CM | POA: Diagnosis not present

## 2021-07-31 DIAGNOSIS — G894 Chronic pain syndrome: Secondary | ICD-10-CM | POA: Diagnosis not present

## 2021-07-31 DIAGNOSIS — R69 Illness, unspecified: Secondary | ICD-10-CM | POA: Diagnosis not present

## 2021-09-07 DIAGNOSIS — G894 Chronic pain syndrome: Secondary | ICD-10-CM | POA: Diagnosis not present

## 2021-09-07 DIAGNOSIS — I10 Essential (primary) hypertension: Secondary | ICD-10-CM | POA: Diagnosis not present

## 2021-09-25 ENCOUNTER — Ambulatory Visit (INDEPENDENT_AMBULATORY_CARE_PROVIDER_SITE_OTHER): Payer: Medicare HMO | Admitting: Internal Medicine

## 2021-09-25 ENCOUNTER — Encounter: Payer: Self-pay | Admitting: Internal Medicine

## 2021-09-25 ENCOUNTER — Other Ambulatory Visit: Payer: Self-pay

## 2021-09-25 VITALS — BP 130/82 | HR 68 | Ht 65.0 in | Wt 165.0 lb

## 2021-09-25 DIAGNOSIS — E1165 Type 2 diabetes mellitus with hyperglycemia: Secondary | ICD-10-CM | POA: Diagnosis not present

## 2021-09-25 DIAGNOSIS — Z794 Long term (current) use of insulin: Secondary | ICD-10-CM | POA: Diagnosis not present

## 2021-09-25 DIAGNOSIS — E1142 Type 2 diabetes mellitus with diabetic polyneuropathy: Secondary | ICD-10-CM

## 2021-09-25 DIAGNOSIS — E1159 Type 2 diabetes mellitus with other circulatory complications: Secondary | ICD-10-CM

## 2021-09-25 LAB — POCT GLYCOSYLATED HEMOGLOBIN (HGB A1C): Hemoglobin A1C: 8.4 % — AB (ref 4.0–5.6)

## 2021-09-25 MED ORDER — TRESIBA FLEXTOUCH 100 UNIT/ML ~~LOC~~ SOPN: 26.0000 [IU] | PEN_INJECTOR | Freq: Every day | SUBCUTANEOUS | 6 refills | Status: DC

## 2021-09-25 NOTE — Progress Notes (Signed)
Name: Marissa [name]  Age/ Sex: 55 y.o., female   MRN/ DOB: [id number], 05-Jul-1966     PCP: Redmond School, MD   Reason for Endocrinology Evaluation: Type 2 Diabetes Mellitus  Initial Endocrine Consultative Visit: 06/04/2019    PATIENT IDENTIFIER: Marissa [name] is a 55 y.o. female with a past medical history of HTN,T2DM, Dyslipidemia and Lung Ca (2006) . The patient has followed with Endocrinology clinic since 06/03/2001 for consultative assistance with management of her diabetes.  DIABETIC HISTORY:  Marissa [name] was diagnosed with T2DM in 2006. She has been on oral glycemic agents in the past ( Glipizide, glimepiride, metformin, Farxiga ( not covered) , insulin added in 01/2019. Her hemoglobin A1c has ranged from 6.7% in 2015, peaking at 13.8% in 02/2019.   On her initial visit to our clinic her A1c was 10.8%  , she was on Glimepiride, Metformin and Levemir. We stopped Levemir, and Glimepiride , started insulin Mix and reduced metformin dose due to nausea and diarrhea.  Jardiance started 10/2019 Stopped insulin mix and started basal insulin 01/6961 Started trulicity 06/5283 but this was stopped due to nausea and vomiting   HYPOKALEMIA: She has had intermittent spontaneous hypokalemia since 2020.  Her  aldosterone came back normal at 4.3 NG/DL with elevated running at 13.8 02/2021 She had an abnormal 24-hour urinary cortisol 07/15/2021 but this was attributed to improper collection.  Dexamethasone suppression test was normal at 1.5 ug/dL 07/2021   SUBJECTIVE:   During the last visit (06/26/2021): A1c 8.5%. We continued metformin,  and Jardiance a, stopped insulin mix, and started [place] and Trulicity   Today (13/24/4010): Marissa [name] is here for a follow up on diabetes management. She checks her blood sugars 1-2 times daily, preprandial to breakfast and supper. The patient has not had hypoglycemic episodes since the last clinic visit.    She is S/P right upper lobe wedge resection  for adenocarcinoma 02/2021 for Stage Ia adenocarcinoma with an 8 mm invasive component, negative L.N   Denies sob  Has chronic cough  Denies nausea, or vomiting     HOME DIABETES REGIMEN:  Metformin 500 mg ,1 tablet BID  Jardiance 25 mg daily  Tresiba 22 units daily   METER DOWNLOAD SUMMARY: 11/17-12/16/2022  Average Number Tests/Day = 1.5 Overall Mean FS Glucose = 147 Standard Deviation = 18  BG Ranges: Low = 122 High = 177    Hypoglycemic Events/30 Days: BG < 50 = 0 Episodes of symptomatic severe hypoglycemia = 0       DIABETIC COMPLICATIONS: Microvascular complications:  Neuropathy , glaucoma  Denies: CKD , retinopathy  Last eye exam: Completed 11/2020   Macrovascular complications:  CAD ( S/P PCI )  Denies:  PVD, CVA   HISTORY:  Past Medical History:  Past Medical History:  Diagnosis Date   Adenocarcinoma of lung ([place]) 02/06/2014   Anxiety    Arthritis    COPD (chronic obstructive pulmonary disease) ([place])    Coronary artery disease    Diabetes mellitus    GERD (gastroesophageal reflux disease)    Hypertension    Hypothyroidism    Lung cancer ([place])    Past Surgical History:  Past Surgical History:  Procedure Laterality Date   APPENDECTOMY     CHOLECYSTECTOMY     INTERCOSTAL NERVE BLOCK Right 02/19/2021   Procedure: INTERCOSTAL NERVE BLOCK;  Surgeon: Melrose Nakayama, MD;  Location: [facility];  Service: Thoracic;  Laterality: Right;   LOBECTOMY Left 2006   NM  MYOCAR IMG MI  05/2008   lexiscan -perfusion defect in anterior myocardium (breast attenuation); remaining myocardium with NO evidence of ischemia or infarct; EF 78%; low risk scan   NODE DISSECTION Right 02/19/2021   Procedure: NODE DISSECTION;  Surgeon: Melrose Nakayama, MD;  Location: [facility];  Service: Thoracic;  Laterality: Right;   OOPHORECTOMY Left    PARTIAL HYSTERECTOMY  1985   SALPINGOOPHORECTOMY Left    benign 9lb mass removed and a massive wall of smaller tumors removed    THORACIC AORTIC ENDOVASCULAR STENT GRAFT N/A 06/04/2019   Procedure: THORACIC AORTIC ENDOVASCULAR STENT GRAFT;  Surgeon: Angelia Mould, MD;  Location: [facility];  Service: Vascular;  Laterality: N/A;   TRANSTHORACIC ECHOCARDIOGRAM  05/2008   EF =/>55%, mild MR; trace TR   VIDEO BRONCHOSCOPY WITH ENDOBRONCHIAL NAVIGATION N/A 02/19/2021   Procedure: VIDEO BRONCHOSCOPY WITH ENDOBRONCHIAL NAVIGATION;  Surgeon: Melrose Nakayama, MD;  Location: [facility];  Service: Thoracic;  Laterality: N/A;   Social History:  reports that she quit smoking about 7 months ago. Her smoking use included cigarettes. She has a 16.00 pack-year smoking history. She has never used smokeless tobacco. She reports that she does not drink alcohol and does not use drugs. Family History:  Family History  Problem Relation Age of Onset   Colon cancer Mother    Lung cancer Mother    Heart disease Mother    Heart disease Father    Ovarian cancer Sister    Hepatitis Brother    Hernia Brother      HOME MEDICATIONS: Allergies as of 09/25/2021       Reactions   Bee Venom Anaphylaxis   Penicillins Swelling, Rash, Other (See Comments)   Did it involve swelling of the face/tongue/throat, SOB, or low BP? Yes-whole body (swelling) Did it involve sudden or severe rash/hives, skin peeling, or any reaction on the inside of your mouth or nose? Yes-Hives Did you need to seek medical attention at a hospital or doctor's office? Yes When did it last happen?  Childhood     If all above answers are "NO", may proceed with cephalosporin use.   Poison Oak Extract Anaphylaxis   Trulicity [dulaglutide] Nausea And Vomiting        Medication List        Accurate as of September 25, 2021  3:24 PM. If you have any questions, ask your nurse or doctor.          acetaminophen 500 MG tablet Commonly known as: TYLENOL Take 2 tablets (1,000 mg total) by mouth every 6 (six) hours.   albuterol (2.5 MG/3ML) 0.083% nebulizer  solution Commonly known as: PROVENTIL Take 2.5 mg by nebulization every 6 (six) hours as needed for shortness of breath or wheezing.   albuterol-ipratropium 18-103 MCG/ACT inhaler Commonly known as: COMBIVENT Inhale 2 puffs into the lungs every 6 (six) hours as needed for wheezing or shortness of breath.   ALPRAZolam 0.5 MG tablet Commonly known as: XANAX Take 0.5 mg by mouth at bedtime.   aspirin EC 81 MG tablet Take 81 mg by mouth daily.   atorvastatin 80 MG tablet Commonly known as: LIPITOR Take 1 tablet (80 mg total) by mouth daily at 6 PM.   BD Pen Needle Nano 2nd Gen 32G X 4 MM Misc Generic drug: Insulin Pen Needle Inject 1 Device into the skin 2 (two) times daily.   Dexcom G6 Sensor Misc 1 Device by Does not apply route as directed.   Dexcom G6  Transmitter Misc 1 Device by Does not apply route as directed.   Eliquis 5 MG Tabs tablet Generic drug: apixaban TAKE 1 TABLET(5 MG) BY MOUTH TWICE DAILY What changed: See the new instructions.   empagliflozin 25 MG Tabs tablet Commonly known as: Jardiance Take 1 tablet (25 mg total) by mouth daily before breakfast.   esomeprazole 40 MG capsule Commonly known as: NEXIUM Take 1 capsule (40 mg total) by mouth daily before breakfast.   ezetimibe 10 MG tablet Commonly known as: ZETIA Take 10 mg by mouth daily.   latanoprost 0.005 % ophthalmic solution Commonly known as: XALATAN Place 1 drop into both eyes at bedtime.   levothyroxine 50 MCG tablet Commonly known as: SYNTHROID Take 1 tablet (50 mcg total) by mouth daily.   metFORMIN 500 MG tablet Commonly known as: Glucophage Take 1 tablet (500 mg total) by mouth 2 (two) times daily with a meal.   OneTouch Ultra test strip Generic drug: glucose blood TEST 7 TIMES DAILY AS DIRECTED   oxyCODONE 15 MG immediate release tablet Commonly known as: ROXICODONE Take 15 mg by mouth every 6 (six) hours as needed for pain.   Tyler Aas FlexTouch 100 UNIT/ML FlexTouch  Pen Generic drug: insulin degludec Inject 26 Units into the skin daily. What changed: how much to take Changed by: Dorita Sciara, MD   zolpidem 10 MG tablet Commonly known as: AMBIEN Take 10 mg by mouth at bedtime.         OBJECTIVE:   Vital Signs: BP 130/82 (BP Location: Left Arm, Patient Position: Sitting, Cuff Size: Small)    Pulse 68    Ht 5\' 5"  (1.651 m)    Wt 165 lb (74.8 kg)    SpO2 95%    BMI 27.46 kg/m   Wt Readings from Last 3 Encounters:  09/25/21 165 lb (74.8 kg)  06/26/21 172 lb 9.6 oz (78.3 kg)  03/10/21 170 lb 9.6 oz (77.4 kg)     Exam: General: Pt appears well and is in NAD  Lungs: Clear with good BS bilat with no rales, rhonchi, or wheezes  Heart: RRR with normal S1 and S2 and no gallops; no murmurs; no rub  Abdomen: Normoactive bowel sounds, soft, nontender  Extremities: No pretibial edema.   Neuro: MS is good with appropriate affect, pt is alert and Ox3       DM foot exam: 06/26/2021   The skin of the feet is intact without sores or ulcerations. The pedal pulses are 2+ on right and 2+ on left. The sensation is intact to a screening 5.07, 10 gram monofilament bilaterally    DATA REVIEWED:  Lab Results  Component Value Date   HGBA1C 8.4 (A) 09/25/2021   HGBA1C 8.5 (A) 06/26/2021   HGBA1C 7.7 (A) 02/16/2021   Results for Marissa [name], Marissa [name] (MRN [id number]) as of 02/17/2021 13:20  Ref. Range 02/16/2021 16:17  Sodium Latest Ref Range: 135 - 145 mEq/L 138  Potassium Latest Ref Range: 3.5 - 5.1 mEq/L 4.3  Chloride Latest Ref Range: 96 - 112 mEq/L 103  CO2 Latest Ref Range: 19 - 32 mEq/L 23  Glucose Latest Ref Range: 70 - 99 mg/dL 110 (H)  BUN Latest Ref Range: 6 - 23 mg/dL 13  Creatinine Latest Ref Range: 0.40 - 1.20 mg/dL 0.62  Calcium Latest Ref Range: 8.4 - 10.5 mg/dL 9.7  GFR Latest Ref Range: >60.00 mL/min 99.84  MICROALB/CREAT RATIO Latest Ref Range: 0.0 - 30.0 mg/g 2.2  TSH Latest Ref Range:  0.35 - 4.50 uIU/mL 1.46  T4,Free(Direct)  Latest Ref Range: 0.60 - 1.60 ng/dL 1.01  Creatinine,U Latest Units: mg/dL 37.9  Microalb, Ur Latest Ref Range: 0.0 - 1.9 mg/dL 0.8   Fructosamine-pending  ASSESSMENT / PLAN / RECOMMENDATIONS:   1) Type 2 Diabetes Mellitus,Poorly controlled, With Neuropathic and Macrovascular  complications - Most recent A1c of 8.4 %. Goal A1c < 7.0 %.    - A1c stable  but continues to be above goal .  She has not been able to tolerate the Trulicity 8.45 mg dose due to nausea and vomiting - Meter download is acceptable with an average BG of 147 mg/DL, will check fructosamine  - Pt with food insecurities  -Intolerant to higher doses of metformin -We will increase insulin as below    MEDICATIONS: Continue Metformin 500 mg BID Continue Jardiance 25 mg daily  Increase Tresiba 26 units daily    EDUCATION / INSTRUCTIONS: BG monitoring instructions: Patient is instructed to check her blood sugars 2 times a day, breakfast and supper. Call [facility] if: BG persistently < 70  I reviewed the Rule of 15 for the treatment of hypoglycemia in detail with the patient. Literature supplied.   2) Hypokalemia :  - She has been noted with spontaneous hypokalemia. She is not on any diuretics.  She had normal renin, aldosterone, and normal dexamethasone suppression test - We held on cushing syndrome screen until after her sx , will proceed with this now. She was provided with orders through [facility] to do it locally       F/U in 3 months    Signed electronically by: Mack Guise, MD  [facility] Endocrinology  [facility] [address] [place], [place] [address] Phone: [phone] FAX: [phone]   CC: [address], [place] [address] Phone: [phone]  Fax: [phone]  Return to Endocrinology clinic as below: Future Appointments  Date Time Provider [place]  01/29/2022  1:20 PM Phenix Vandermeulen, Melanie Crazier, MD [facility] None

## 2021-09-25 NOTE — Patient Instructions (Signed)
-   Metformin ONE tablet with Breakfast and ONE tablet with Supper - Jardiance 25 mg with breakfast   - Increase Tresiba 26 units once daily   -HOW TO TREAT LOW BLOOD SUGARS (Blood sugar LESS THAN 70 MG/DL) Please follow the RULE OF 15 for the treatment of hypoglycemia treatment (when your (blood sugars are less than 70 mg/dL)   STEP 1: Take 15 grams of carbohydrates when your blood sugar is low, which includes:  3-4 GLUCOSE TABS  OR 3-4 OZ OF JUICE OR REGULAR SODA OR ONE TUBE OF GLUCOSE GEL    STEP 2: RECHECK blood sugar in 15 MINUTES STEP 3: If your blood sugar is still low at the 15 minute recheck --> then, go back to STEP 1 and treat AGAIN with another 15 grams of carbohydrates.

## 2021-09-30 LAB — FRUCTOSAMINE: Fructosamine: 268 umol/L (ref 205–285)

## 2021-10-02 DIAGNOSIS — G894 Chronic pain syndrome: Secondary | ICD-10-CM | POA: Diagnosis not present

## 2021-10-02 DIAGNOSIS — R110 Nausea: Secondary | ICD-10-CM | POA: Diagnosis not present

## 2021-10-02 DIAGNOSIS — I10 Essential (primary) hypertension: Secondary | ICD-10-CM | POA: Diagnosis not present

## 2021-10-02 DIAGNOSIS — J449 Chronic obstructive pulmonary disease, unspecified: Secondary | ICD-10-CM | POA: Diagnosis not present

## 2021-11-04 DIAGNOSIS — J329 Chronic sinusitis, unspecified: Secondary | ICD-10-CM | POA: Diagnosis not present

## 2021-11-04 DIAGNOSIS — J449 Chronic obstructive pulmonary disease, unspecified: Secondary | ICD-10-CM | POA: Diagnosis not present

## 2021-11-04 DIAGNOSIS — G894 Chronic pain syndrome: Secondary | ICD-10-CM | POA: Diagnosis not present

## 2021-11-04 DIAGNOSIS — E663 Overweight: Secondary | ICD-10-CM | POA: Diagnosis not present

## 2021-11-04 DIAGNOSIS — Z6827 Body mass index (BMI) 27.0-27.9, adult: Secondary | ICD-10-CM | POA: Diagnosis not present

## 2021-11-04 DIAGNOSIS — I10 Essential (primary) hypertension: Secondary | ICD-10-CM | POA: Diagnosis not present

## 2021-12-04 DIAGNOSIS — I10 Essential (primary) hypertension: Secondary | ICD-10-CM | POA: Diagnosis not present

## 2021-12-04 DIAGNOSIS — G894 Chronic pain syndrome: Secondary | ICD-10-CM | POA: Diagnosis not present

## 2021-12-04 DIAGNOSIS — J449 Chronic obstructive pulmonary disease, unspecified: Secondary | ICD-10-CM | POA: Diagnosis not present

## 2022-01-04 DIAGNOSIS — J449 Chronic obstructive pulmonary disease, unspecified: Secondary | ICD-10-CM | POA: Diagnosis not present

## 2022-01-04 DIAGNOSIS — I10 Essential (primary) hypertension: Secondary | ICD-10-CM | POA: Diagnosis not present

## 2022-01-04 DIAGNOSIS — G894 Chronic pain syndrome: Secondary | ICD-10-CM | POA: Diagnosis not present

## 2022-01-08 ENCOUNTER — Other Ambulatory Visit: Payer: Self-pay | Admitting: Internal Medicine

## 2022-01-08 DIAGNOSIS — E1165 Type 2 diabetes mellitus with hyperglycemia: Secondary | ICD-10-CM | POA: Diagnosis not present

## 2022-01-08 DIAGNOSIS — E782 Mixed hyperlipidemia: Secondary | ICD-10-CM | POA: Diagnosis not present

## 2022-01-08 DIAGNOSIS — I10 Essential (primary) hypertension: Secondary | ICD-10-CM | POA: Diagnosis not present

## 2022-01-12 ENCOUNTER — Other Ambulatory Visit: Payer: Self-pay

## 2022-01-12 MED ORDER — ONETOUCH ULTRA VI STRP: ORAL_STRIP | 2 refills | Status: DC

## 2022-01-13 ENCOUNTER — Other Ambulatory Visit (HOSPITAL_COMMUNITY): Payer: Self-pay

## 2022-01-13 ENCOUNTER — Telehealth: Payer: Self-pay | Admitting: Pharmacy Technician

## 2022-01-13 NOTE — Telephone Encounter (Signed)
Patient Advocate Encounter ? ?Received notification from PHARMACY that prior authorization for ONE TOUCH ULTRA is required. ?  ?PA submitted on 4.5.23 ?Key [id number] ?Status is pending ?  ?[facility] will continue to follow ? ?Travor Royce R Vaniya Augspurger, CPhT ?Patient Advocate ?[facility] ?Phone: [phone] ?Fax:  [phone] ? ?

## 2022-01-14 ENCOUNTER — Other Ambulatory Visit (HOSPITAL_COMMUNITY): Payer: Self-pay

## 2022-01-14 NOTE — Telephone Encounter (Signed)
Received notification from [facility] regarding a prior authorization for ONE TOUCHE ULTRA. Authorization has been APPROVED from 04.05.23 to 04.05.24.  ? ? ? ?Authorization # PA Case ID: [id number] ? ? ?

## 2022-01-29 ENCOUNTER — Ambulatory Visit (INDEPENDENT_AMBULATORY_CARE_PROVIDER_SITE_OTHER): Payer: Medicare HMO | Admitting: Internal Medicine

## 2022-01-29 ENCOUNTER — Other Ambulatory Visit (HOSPITAL_COMMUNITY): Payer: Self-pay

## 2022-01-29 ENCOUNTER — Encounter: Payer: Self-pay | Admitting: Internal Medicine

## 2022-01-29 ENCOUNTER — Other Ambulatory Visit: Payer: Self-pay | Admitting: Internal Medicine

## 2022-01-29 ENCOUNTER — Telehealth: Payer: Self-pay | Admitting: Internal Medicine

## 2022-01-29 VITALS — BP 120/72 | HR 100 | Ht 65.0 in | Wt 162.2 lb

## 2022-01-29 DIAGNOSIS — R739 Hyperglycemia, unspecified: Secondary | ICD-10-CM

## 2022-01-29 DIAGNOSIS — Z794 Long term (current) use of insulin: Secondary | ICD-10-CM

## 2022-01-29 DIAGNOSIS — E1169 Type 2 diabetes mellitus with other specified complication: Secondary | ICD-10-CM

## 2022-01-29 DIAGNOSIS — E1165 Type 2 diabetes mellitus with hyperglycemia: Secondary | ICD-10-CM

## 2022-01-29 DIAGNOSIS — E1142 Type 2 diabetes mellitus with diabetic polyneuropathy: Secondary | ICD-10-CM

## 2022-01-29 DIAGNOSIS — E1159 Type 2 diabetes mellitus with other circulatory complications: Secondary | ICD-10-CM | POA: Diagnosis not present

## 2022-01-29 LAB — POCT GLYCOSYLATED HEMOGLOBIN (HGB A1C): Hemoglobin A1C: 9.4 % — AB (ref 4.0–5.6)

## 2022-01-29 MED ORDER — TRESIBA FLEXTOUCH 100 UNIT/ML ~~LOC~~ SOPN: 32.0000 [IU] | PEN_INJECTOR | Freq: Every day | SUBCUTANEOUS | 6 refills | Status: DC

## 2022-01-29 MED ORDER — ONETOUCH ULTRA VI STRP: 1.0000 | ORAL_STRIP | Freq: Four times a day (QID) | 3 refills | Status: AC

## 2022-01-29 NOTE — Progress Notes (Signed)
? ?Name: Marissa [name]  ?Age/ Sex: 56 y.o., female   ?MRN/ DOB: [id number], 1966-09-27    ? ?PCP: Redmond School, MD   ?Reason for Endocrinology Evaluation: Type 2 Diabetes Mellitus  ?Initial Endocrine Consultative Visit: 06/04/2019  ? ? ?PATIENT IDENTIFIER: Marissa [name] is a 56 y.o. female with a past medical history of HTN,T2DM, Dyslipidemia and Lung Ca (2006) . The patient has followed with Endocrinology clinic since 06/03/2001 for consultative assistance with management of her diabetes. ? ?DIABETIC HISTORY:  ?Ms. Scheibe was diagnosed with T2DM in 2006. She has been on oral glycemic agents in the past ( Glipizide, glimepiride, metformin, Farxiga ( not covered) , insulin added in 01/2019. Her hemoglobin A1c has ranged from 6.7% in 2015, peaking at 13.8% in 02/2019. ? ? ?On her initial visit to our clinic her A1c was 10.8%  , she was on Glimepiride, Metformin and Levemir. We stopped Levemir, and Glimepiride , started insulin Mix and reduced metformin dose due to nausea and diarrhea.  ?Jardiance started 10/2019 ?Stopped insulin mix and started basal insulin 06/2021 ?Started trulicity 05/6760 but this was stopped due to nausea and vomiting ? ? ?HYPOKALEMIA: ?She has had intermittent spontaneous hypokalemia since 2020.  Her  aldosterone came back normal at 4.3 NG/DL with elevated running at 13.8 02/2021 ?She had an abnormal 24-hour urinary cortisol 07/15/2021 but this was attributed to improper collection.  Dexamethasone suppression test was normal at 1.5 ug/dL 07/2021 ? ? ?SUBJECTIVE:  ? ?During the last visit (09/25/2021): A1c 8.4%. We continued metformin,  and Jardiance a, and increased insulin ? ? ?Today (01/29/2022): [name] is here for a follow up on diabetes management. She checks her blood sugars 1-2 times daily, preprandial to breakfast and supper. The patient has not had hypoglycemic episodes since the last clinic visit.  ? ? ?She is S/P right upper lobe wedge resection for adenocarcinoma 02/2021 for  Stage Ia adenocarcinoma with an 8 mm invasive component, negative L.N  ? ?Denies sob  ?Has chronic cough  ?Denies nausea, or vomiting  ?Admits to dietary indiscretions  ? ? ?HOME DIABETES REGIMEN:  ?Metformin 500 mg ,1 tablet BID  ?Jardiance 25 mg daily  ?Tresiba 26 units daily ? ? ?METER DOWNLOAD SUMMARY: 4/8-4/21/2023 ? ?Overall Mean FS Glucose = 176 ?Standard Deviation = 18 ? ?BG Ranges: ?Low = 151 ?High = 204 ? ? ? ?Hypoglycemic Events/30 Days: ?BG < 50 = 0 ?Episodes of symptomatic severe hypoglycemia = 0 ? ? ? ? ? ? ?DIABETIC COMPLICATIONS: ?Microvascular complications:  ?Neuropathy , glaucoma  ?Denies: CKD , retinopathy  ?Last eye exam: Completed 12/2021 ?  ?Macrovascular complications:  ?CAD ( S/P PCI )  ?Denies:  PVD, CVA ? ? ?HISTORY:  ?Past Medical History:  ?Past Medical History:  ?Diagnosis Date  ? Adenocarcinoma of lung ([place]) 02/06/2014  ? Anxiety   ? Arthritis   ? COPD (chronic obstructive pulmonary disease) ([place])   ? Coronary artery disease   ? Diabetes mellitus   ? GERD (gastroesophageal reflux disease)   ? Hypertension   ? Hypothyroidism   ? Lung cancer ([place])   ? ?Past Surgical History:  ?Past Surgical History:  ?Procedure Laterality Date  ? APPENDECTOMY    ? CHOLECYSTECTOMY    ? INTERCOSTAL NERVE BLOCK Right 02/19/2021  ? Procedure: INTERCOSTAL NERVE BLOCK;  Surgeon: Melrose Nakayama, MD;  Location: [place];  Service: Thoracic;  Laterality: Right;  ? LOBECTOMY Left 2006  ? NM MYOCAR IMG MI  05/2008  ?  lexiscan -perfusion defect in anterior myocardium (breast attenuation); remaining myocardium with NO evidence of ischemia or infarct; EF 78%; low risk scan  ? NODE DISSECTION Right 02/19/2021  ? Procedure: NODE DISSECTION;  Surgeon: Melrose Nakayama, MD;  Location: [place];  Service: Thoracic;  Laterality: Right;  ? OOPHORECTOMY Left   ? PARTIAL HYSTERECTOMY  1985  ? SALPINGOOPHORECTOMY Left   ? benign 9lb mass removed and a massive wall of smaller tumors removed  ? THORACIC AORTIC ENDOVASCULAR STENT  GRAFT N/A 06/04/2019  ? Procedure: THORACIC AORTIC ENDOVASCULAR STENT GRAFT;  Surgeon: Angelia Mould, MD;  Location: [place] [facility];  Service: Vascular;  Laterality: N/A;  ? TRANSTHORACIC ECHOCARDIOGRAM  05/2008  ? EF =/>55%, mild MR; trace TR  ? VIDEO BRONCHOSCOPY WITH ENDOBRONCHIAL NAVIGATION N/A 02/19/2021  ? Procedure: VIDEO BRONCHOSCOPY WITH ENDOBRONCHIAL NAVIGATION;  Surgeon: Melrose Nakayama, MD;  Location: [facility];  Service: Thoracic;  Laterality: N/A;  ? ?Social History:  reports that she quit smoking about a year ago. Her smoking use included cigarettes. She has a 16.00 pack-year smoking history. She has never used smokeless tobacco. She reports that she does not drink alcohol and does not use drugs. ?Family History:  ?Family History  ?Problem Relation Age of Onset  ? Colon cancer Mother   ? Lung cancer Mother   ? Heart disease Mother   ? Heart disease Father   ? Ovarian cancer Sister   ? Hepatitis Brother   ? Hernia Brother   ? ? ? ?HOME MEDICATIONS: ?Allergies as of 01/29/2022   ? ?   Reactions  ? Bee Venom Anaphylaxis  ? Penicillins Swelling, Rash, Other (See Comments)  ? Did it involve swelling of the face/tongue/throat, SOB, or low BP? Yes-whole body (swelling) ?Did it involve sudden or severe rash/hives, skin peeling, or any reaction on the inside of your mouth or nose? Yes-Hives ?Did you need to seek medical attention at a hospital or doctor's office? Yes ?When did it last happen?  Childhood     ?If all above answers are "NO", may proceed with cephalosporin use.  ? Poison Oak Extract Anaphylaxis  ? Trulicity [dulaglutide] Nausea And Vomiting  ? ?  ? ?  ?Medication List  ?  ? ?  ? Accurate as of January 29, 2022 11:59 PM. If you have any questions, ask your nurse or doctor.  ?  ?  ? ?  ? ?acetaminophen 500 MG tablet ?Commonly known as: TYLENOL ?Take 2 tablets (1,000 mg total) by mouth every 6 (six) hours. ?  ?albuterol (2.5 MG/3ML) 0.083% nebulizer solution ?Commonly known as: PROVENTIL ?Take 2.5 mg by  nebulization every 6 (six) hours as needed for shortness of breath or wheezing. ?  ?albuterol-ipratropium 18-103 MCG/ACT inhaler ?Commonly known as: COMBIVENT ?Inhale 2 puffs into the lungs every 6 (six) hours as needed for wheezing or shortness of breath. ?  ?ALPRAZolam 0.5 MG tablet ?Commonly known as: Duanne Moron ?Take 0.5 mg by mouth at bedtime. ?  ?aspirin EC 81 MG tablet ?Take 81 mg by mouth daily. ?  ?atorvastatin 80 MG tablet ?Commonly known as: LIPITOR ?Take 1 tablet (80 mg total) by mouth daily at 6 PM. ?  ?BD Pen Needle Nano 2nd Gen 32G X 4 MM Misc ?Generic drug: Insulin Pen Needle ?INJECT 1 DEVICE INTO THE SKIN TWICE DAILY ?What changed: See the new instructions. ?Changed by: Dorita Sciara, MD ?  ?Dexcom G6 Sensor Misc ?1 Device by Does not apply route as directed. ?  ?  Dexcom G6 Transmitter Misc ?1 Device by Does not apply route as directed. ?  ?Eliquis 5 MG Tabs tablet ?Generic drug: apixaban ?TAKE 1 TABLET(5 MG) BY MOUTH TWICE DAILY ?What changed: See the new instructions. ?  ?empagliflozin 25 MG Tabs tablet ?Commonly known as: Jardiance ?Take 1 tablet (25 mg total) by mouth daily before breakfast. ?  ?esomeprazole 40 MG capsule ?Commonly known as: [place] ?Take 1 capsule (40 mg total) by mouth daily before breakfast. ?  ?ezetimibe 10 MG tablet ?Commonly known as: ZETIA ?Take 10 mg by mouth daily. ?  ?latanoprost 0.005 % ophthalmic solution ?Commonly known as: XALATAN ?Place 1 drop into both eyes at bedtime. ?  ?levothyroxine 50 MCG tablet ?Commonly known as: SYNTHROID ?Take 1 tablet (50 mcg total) by mouth daily. ?  ?lisinopril 10 MG tablet ?Commonly known as: ZESTRIL ?Take 10 mg by mouth daily. ?  ?metFORMIN 500 MG tablet ?Commonly known as: Glucophage ?Take 1 tablet (500 mg total) by mouth 2 (two) times daily with a meal. ?  ?OneTouch Ultra test strip ?Generic drug: glucose blood ?1 each by Other route in the morning, at noon, in the evening, and at bedtime. Check blood sugar 3 times daily ?What  changed:  ?how much to take ?how to take this ?when to take this ?Changed by: Dorita Sciara, MD ?  ?oxyCODONE 15 MG immediate release tablet ?Commonly known as: ROXICODONE ?Take 15 mg by mouth every

## 2022-01-29 NOTE — Telephone Encounter (Signed)
Can you please obtain PA for dexcom on her ? I've tried to prescribe it in the past but today she tells me someone needs to call [employer] at [phone]  ? ? ? ?Please let me know if I need to send this to her regular pharmacy or DME place . ? ? ?Thanks  ?Abby Nena Jordan, MD ? ?[facility]  ?[facility] ?[address] ?[place], [place] [address] ?Phone: [phone] ?FAX: [phone] ? ?

## 2022-01-29 NOTE — Patient Instructions (Signed)
-   Metformin ONE tablet with Breakfast and ONE tablet with Supper ?- Jardiance 25 mg with breakfast   ?- Increase Tresiba 32 units once daily  ? ?-HOW TO TREAT LOW BLOOD SUGARS (Blood sugar LESS THAN 70 MG/DL) ?Please follow the RULE OF 15 for the treatment of hypoglycemia treatment (when your (blood sugars are less than 70 mg/dL)  ? ?STEP 1: Take 15 grams of carbohydrates when your blood sugar is low, which includes:  ?3-4 GLUCOSE TABS  OR ?3-4 OZ OF JUICE OR REGULAR SODA OR ?ONE TUBE OF GLUCOSE GEL   ? ?STEP 2: RECHECK blood sugar in 15 MINUTES ?STEP 3: If your blood sugar is still low at the 15 minute recheck --> then, go back to STEP 1 and treat AGAIN with another 15 grams of carbohydrates. ? ? ? ? ? ?

## 2022-02-01 MED ORDER — DEXCOM G6 TRANSMITTER MISC: 1.0000 | 3 refills | Status: DC

## 2022-02-01 MED ORDER — DEXCOM G6 SENSOR MISC: 1.0000 | 3 refills | Status: DC

## 2022-02-05 DIAGNOSIS — R69 Illness, unspecified: Secondary | ICD-10-CM | POA: Diagnosis not present

## 2022-02-05 DIAGNOSIS — Z1331 Encounter for screening for depression: Secondary | ICD-10-CM | POA: Diagnosis not present

## 2022-02-05 DIAGNOSIS — E1169 Type 2 diabetes mellitus with other specified complication: Secondary | ICD-10-CM | POA: Diagnosis not present

## 2022-02-05 DIAGNOSIS — Z0001 Encounter for general adult medical examination with abnormal findings: Secondary | ICD-10-CM | POA: Diagnosis not present

## 2022-02-05 DIAGNOSIS — J449 Chronic obstructive pulmonary disease, unspecified: Secondary | ICD-10-CM | POA: Diagnosis not present

## 2022-02-05 DIAGNOSIS — I10 Essential (primary) hypertension: Secondary | ICD-10-CM | POA: Diagnosis not present

## 2022-02-05 DIAGNOSIS — F419 Anxiety disorder, unspecified: Secondary | ICD-10-CM | POA: Diagnosis not present

## 2022-02-05 DIAGNOSIS — E663 Overweight: Secondary | ICD-10-CM | POA: Diagnosis not present

## 2022-02-05 DIAGNOSIS — G894 Chronic pain syndrome: Secondary | ICD-10-CM | POA: Diagnosis not present

## 2022-02-05 DIAGNOSIS — K219 Gastro-esophageal reflux disease without esophagitis: Secondary | ICD-10-CM | POA: Diagnosis not present

## 2022-02-05 DIAGNOSIS — G4700 Insomnia, unspecified: Secondary | ICD-10-CM | POA: Diagnosis not present

## 2022-02-05 DIAGNOSIS — Z6827 Body mass index (BMI) 27.0-27.9, adult: Secondary | ICD-10-CM | POA: Diagnosis not present

## 2022-02-08 ENCOUNTER — Telehealth: Payer: Self-pay

## 2022-02-08 DIAGNOSIS — I10 Essential (primary) hypertension: Secondary | ICD-10-CM | POA: Diagnosis not present

## 2022-02-08 DIAGNOSIS — E1169 Type 2 diabetes mellitus with other specified complication: Secondary | ICD-10-CM | POA: Diagnosis not present

## 2022-02-08 DIAGNOSIS — E559 Vitamin D deficiency, unspecified: Secondary | ICD-10-CM | POA: Diagnosis not present

## 2022-02-08 DIAGNOSIS — J449 Chronic obstructive pulmonary disease, unspecified: Secondary | ICD-10-CM | POA: Diagnosis not present

## 2022-02-08 DIAGNOSIS — Z0001 Encounter for general adult medical examination with abnormal findings: Secondary | ICD-10-CM | POA: Diagnosis not present

## 2022-02-08 NOTE — Telephone Encounter (Signed)
Patient notified and will make changes  ?

## 2022-02-08 NOTE — Telephone Encounter (Signed)
Patient states that she is doing the 32 units of insulin at bedtime ? Jardiance daily  ?Metformin 2 times daily ? ?200- 9:30am  02/08/22 ? ?188-8:30pm 02/07/22 ? ?170-10:13am 02/07/22 ? ?154 -2:43pm 02/06/22 ? ?188-7:58am 02/06/29 ? ? ? ? ? ? ?

## 2022-02-12 ENCOUNTER — Telehealth: Payer: Self-pay

## 2022-02-12 NOTE — Telephone Encounter (Signed)
Patient would like appointment to be trained on how to use the Dexcom  ?

## 2022-02-15 DIAGNOSIS — E1165 Type 2 diabetes mellitus with hyperglycemia: Secondary | ICD-10-CM | POA: Diagnosis not present

## 2022-02-15 NOTE — Telephone Encounter (Signed)
LVM to call me with phone number for training on Dexcom ?

## 2022-02-15 NOTE — Telephone Encounter (Signed)
Done

## 2022-03-17 DIAGNOSIS — E1165 Type 2 diabetes mellitus with hyperglycemia: Secondary | ICD-10-CM | POA: Diagnosis not present

## 2022-03-24 ENCOUNTER — Telehealth: Payer: Self-pay

## 2022-03-24 NOTE — Telephone Encounter (Signed)
Patient states that her sugar have been low in the evening and she has to eat something before she takes the [place]  32 units so she doesn't bottom out. Patient is not sure how to adjust her medication to stop the sugar from going low.

## 2022-03-24 NOTE — Telephone Encounter (Signed)
Patient has been advised and will make adjustments

## 2022-04-09 ENCOUNTER — Encounter: Payer: Self-pay | Admitting: Internal Medicine

## 2022-04-09 ENCOUNTER — Telehealth: Payer: Self-pay

## 2022-04-09 NOTE — Telephone Encounter (Signed)
Patient states that she has been bottoming out. Patient taking Tresiba 32 units, Jardiance 25mg , and Metformin 500mg  2 times daily. Report has been printed for review

## 2022-04-09 NOTE — Telephone Encounter (Signed)
Patient advised and will make medication adjustments

## 2022-04-16 DIAGNOSIS — E1165 Type 2 diabetes mellitus with hyperglycemia: Secondary | ICD-10-CM | POA: Diagnosis not present

## 2022-05-07 DIAGNOSIS — E1169 Type 2 diabetes mellitus with other specified complication: Secondary | ICD-10-CM | POA: Diagnosis not present

## 2022-05-07 DIAGNOSIS — J449 Chronic obstructive pulmonary disease, unspecified: Secondary | ICD-10-CM | POA: Diagnosis not present

## 2022-05-07 DIAGNOSIS — I10 Essential (primary) hypertension: Secondary | ICD-10-CM | POA: Diagnosis not present

## 2022-05-07 DIAGNOSIS — G894 Chronic pain syndrome: Secondary | ICD-10-CM | POA: Diagnosis not present

## 2022-05-17 DIAGNOSIS — M9901 Segmental and somatic dysfunction of cervical region: Secondary | ICD-10-CM | POA: Diagnosis not present

## 2022-05-17 DIAGNOSIS — M9903 Segmental and somatic dysfunction of lumbar region: Secondary | ICD-10-CM | POA: Diagnosis not present

## 2022-05-17 DIAGNOSIS — M5442 Lumbago with sciatica, left side: Secondary | ICD-10-CM | POA: Diagnosis not present

## 2022-05-17 DIAGNOSIS — M9905 Segmental and somatic dysfunction of pelvic region: Secondary | ICD-10-CM | POA: Diagnosis not present

## 2022-05-17 DIAGNOSIS — E1165 Type 2 diabetes mellitus with hyperglycemia: Secondary | ICD-10-CM | POA: Diagnosis not present

## 2022-05-17 DIAGNOSIS — M546 Pain in thoracic spine: Secondary | ICD-10-CM | POA: Diagnosis not present

## 2022-05-17 DIAGNOSIS — M9902 Segmental and somatic dysfunction of thoracic region: Secondary | ICD-10-CM | POA: Diagnosis not present

## 2022-05-21 DIAGNOSIS — M546 Pain in thoracic spine: Secondary | ICD-10-CM | POA: Diagnosis not present

## 2022-05-21 DIAGNOSIS — M5442 Lumbago with sciatica, left side: Secondary | ICD-10-CM | POA: Diagnosis not present

## 2022-05-21 DIAGNOSIS — M9901 Segmental and somatic dysfunction of cervical region: Secondary | ICD-10-CM | POA: Diagnosis not present

## 2022-05-21 DIAGNOSIS — M9905 Segmental and somatic dysfunction of pelvic region: Secondary | ICD-10-CM | POA: Diagnosis not present

## 2022-05-21 DIAGNOSIS — M9902 Segmental and somatic dysfunction of thoracic region: Secondary | ICD-10-CM | POA: Diagnosis not present

## 2022-05-21 DIAGNOSIS — M9903 Segmental and somatic dysfunction of lumbar region: Secondary | ICD-10-CM | POA: Diagnosis not present

## 2022-05-28 DIAGNOSIS — M9905 Segmental and somatic dysfunction of pelvic region: Secondary | ICD-10-CM | POA: Diagnosis not present

## 2022-05-28 DIAGNOSIS — M9902 Segmental and somatic dysfunction of thoracic region: Secondary | ICD-10-CM | POA: Diagnosis not present

## 2022-05-28 DIAGNOSIS — M546 Pain in thoracic spine: Secondary | ICD-10-CM | POA: Diagnosis not present

## 2022-05-28 DIAGNOSIS — M5442 Lumbago with sciatica, left side: Secondary | ICD-10-CM | POA: Diagnosis not present

## 2022-05-28 DIAGNOSIS — M9903 Segmental and somatic dysfunction of lumbar region: Secondary | ICD-10-CM | POA: Diagnosis not present

## 2022-05-28 DIAGNOSIS — M9901 Segmental and somatic dysfunction of cervical region: Secondary | ICD-10-CM | POA: Diagnosis not present

## 2022-06-02 ENCOUNTER — Ambulatory Visit (INDEPENDENT_AMBULATORY_CARE_PROVIDER_SITE_OTHER): Payer: Medicare HMO | Admitting: Internal Medicine

## 2022-06-02 ENCOUNTER — Encounter: Payer: Self-pay | Admitting: Internal Medicine

## 2022-06-02 VITALS — BP 100/64 | HR 84 | Ht 65.0 in | Wt 157.6 lb

## 2022-06-02 DIAGNOSIS — E1159 Type 2 diabetes mellitus with other circulatory complications: Secondary | ICD-10-CM | POA: Diagnosis not present

## 2022-06-02 DIAGNOSIS — E1142 Type 2 diabetes mellitus with diabetic polyneuropathy: Secondary | ICD-10-CM | POA: Diagnosis not present

## 2022-06-02 DIAGNOSIS — Z794 Long term (current) use of insulin: Secondary | ICD-10-CM

## 2022-06-02 DIAGNOSIS — E1165 Type 2 diabetes mellitus with hyperglycemia: Secondary | ICD-10-CM | POA: Diagnosis not present

## 2022-06-02 LAB — POCT GLYCOSYLATED HEMOGLOBIN (HGB A1C): Hemoglobin A1C: 7.6 % — AB (ref 4.0–5.6)

## 2022-06-02 MED ORDER — METFORMIN HCL 500 MG PO TABS: 500.0000 mg | ORAL_TABLET | Freq: Two times a day (BID) | ORAL | 3 refills | Status: DC

## 2022-06-02 MED ORDER — TRESIBA FLEXTOUCH 100 UNIT/ML ~~LOC~~ SOPN
26.0000 [IU] | PEN_INJECTOR | Freq: Every day | SUBCUTANEOUS | 3 refills | Status: DC
Start: 2022-06-02 — End: 2022-12-08

## 2022-06-02 MED ORDER — TRESIBA FLEXTOUCH 100 UNIT/ML ~~LOC~~ SOPN
32.0000 [IU] | PEN_INJECTOR | Freq: Every day | SUBCUTANEOUS | 6 refills | Status: DC
Start: 2022-06-02 — End: 2022-06-02

## 2022-06-02 MED ORDER — EMPAGLIFLOZIN 25 MG PO TABS: 25.0000 mg | ORAL_TABLET | Freq: Every day | ORAL | 3 refills | Status: DC

## 2022-06-02 MED ORDER — DEXCOM G7 SENSOR MISC: 1.0000 | 3 refills | Status: AC

## 2022-06-02 NOTE — Patient Instructions (Signed)
-   Metformin ONE tablet with Breakfast and ONE tablet with Supper - Jardiance 25 mg with breakfast   - Decrease  Tresiba 26 units once daily   -HOW TO TREAT LOW BLOOD SUGARS (Blood sugar LESS THAN 70 MG/DL) Please follow the RULE OF 15 for the treatment of hypoglycemia treatment (when your (blood sugars are less than 70 mg/dL)   STEP 1: Take 15 grams of carbohydrates when your blood sugar is low, which includes:  3-4 GLUCOSE TABS  OR 3-4 OZ OF JUICE OR REGULAR SODA OR ONE TUBE OF GLUCOSE GEL    STEP 2: RECHECK blood sugar in 15 MINUTES STEP 3: If your blood sugar is still low at the 15 minute recheck --> then, go back to STEP 1 and treat AGAIN with another 15 grams of carbohydrates.

## 2022-06-02 NOTE — Progress Notes (Signed)
Name: TANIAH REINECKE  Age/ Sex: 56 y.o., female   MRN/ DOB: [id number], Oct 08, 1966     PCP: Redmond School, MD   Reason for Endocrinology Evaluation: Type 2 Diabetes Mellitus  Initial Endocrine Consultative Visit: 06/04/2019    PATIENT IDENTIFIER: Ms. STEFANEE MCKELL is a 56 y.o. female with a past medical history of HTN,T2DM, Dyslipidemia and Lung Ca (2006) . The patient has followed with Endocrinology clinic since 06/03/2001 for consultative assistance with management of her diabetes.  DIABETIC HISTORY:  Ms. Pates was diagnosed with T2DM in 2006. She has been on oral glycemic agents in the past ( Glipizide, glimepiride, metformin, Farxiga ( not covered) , insulin added in 01/2019. Her hemoglobin A1c has ranged from 6.7% in 2015, peaking at 13.8% in 02/2019.   On her initial visit to our clinic her A1c was 10.8%  , she was on Glimepiride, Metformin and Levemir. We stopped Levemir, and Glimepiride , started insulin Mix and reduced metformin dose due to nausea and diarrhea.  Jardiance started 10/2019 Stopped insulin mix and started basal insulin 01/6567 Started trulicity 10/2749 but this was stopped due to nausea and vomiting   HYPOKALEMIA: She has had intermittent spontaneous hypokalemia since 2020.  Her  aldosterone came back normal at 4.3 NG/DL with elevated running at 13.8 02/2021 She had an abnormal 24-hour urinary cortisol 07/15/2021 but this was attributed to improper collection.  Dexamethasone suppression test was normal at 1.5 ug/dL 07/2021   SUBJECTIVE:   During the last visit (09/25/2021): A1c 8.4%. We continued metformin,  and Jardiance a, and increased insulin   Today (01/29/2022): Ms. Habel is here for a follow up on diabetes management. She checks her blood sugars  multiple times daily, through CGM. The patient has had hypoglycemic episodes since the last clinic visit.  These have been occurring during the day with activities, she is in process of moving to a new  place.     She is S/P right upper lobe wedge resection for adenocarcinoma 02/2021 for Stage Ia adenocarcinoma with an 8 mm invasive component, negative L.N   Denies nausea, vomiting or diarrhea    HOME DIABETES REGIMEN:  Metformin 500 mg ,1 tablet BID  Jardiance 25 mg daily  Tresiba 29 units daily    CONTINUOUS GLUCOSE MONITORING RECORD INTERPRETATION    Dates of Recording: 8/10 - 06/02/2022  Sensor description: Dexcom  Results statistics:   CGM use % of time 86  Average and SD 144/35  Time in range     85   %  % Time Above 180 13  % Time above 250 1  % Time Below target <1     Glycemic patterns summary: Patient has been noted with optimal BG's during the night and most of the day with occasional hypoglycemia at night  Hyperglycemic episodes but times neck  Hypoglycemic episodes occurred during the day  Overnight periods: trends down            DIABETIC COMPLICATIONS: Microvascular complications:  Neuropathy , glaucoma  Denies: CKD , retinopathy  Last eye exam: Completed 12/2021   Macrovascular complications:  CAD ( S/P PCI )  Denies:  PVD, CVA   HISTORY:  Past Medical History:  Past Medical History:  Diagnosis Date   Adenocarcinoma of lung ([place]) 02/06/2014   Anxiety    Arthritis    COPD (chronic obstructive pulmonary disease) (HCC)    Coronary artery disease    Diabetes mellitus    GERD (gastroesophageal reflux disease)  Hypertension    Hypothyroidism    Lung cancer [facility])    Past Surgical History:  Past Surgical History:  Procedure Laterality Date   APPENDECTOMY     CHOLECYSTECTOMY     INTERCOSTAL NERVE BLOCK Right 02/19/2021   Procedure: INTERCOSTAL NERVE BLOCK;  Surgeon: Melrose Nakayama, MD;  Location: [facility];  Service: Thoracic;  Laterality: Right;   LOBECTOMY Left 2006   NM MYOCAR IMG MI  05/2008   lexiscan -perfusion defect in anterior myocardium (breast attenuation); remaining myocardium with NO evidence of ischemia or  infarct; EF 78%; low risk scan   NODE DISSECTION Right 02/19/2021   Procedure: NODE DISSECTION;  Surgeon: Melrose Nakayama, MD;  Location: [facility];  Service: Thoracic;  Laterality: Right;   OOPHORECTOMY Left    PARTIAL HYSTERECTOMY  1985   SALPINGOOPHORECTOMY Left    benign 9lb mass removed and a massive wall of smaller tumors removed   THORACIC AORTIC ENDOVASCULAR STENT GRAFT N/A 06/04/2019   Procedure: THORACIC AORTIC ENDOVASCULAR STENT GRAFT;  Surgeon: Angelia Mould, MD;  Location: [facility];  Service: Vascular;  Laterality: N/A;   TRANSTHORACIC ECHOCARDIOGRAM  05/2008   EF =/>55%, mild MR; trace TR   VIDEO BRONCHOSCOPY WITH ENDOBRONCHIAL NAVIGATION N/A 02/19/2021   Procedure: VIDEO BRONCHOSCOPY WITH ENDOBRONCHIAL NAVIGATION;  Surgeon: Melrose Nakayama, MD;  Location: [facility];  Service: Thoracic;  Laterality: N/A;   Social History:  reports that she quit smoking about 15 months ago. Her smoking use included cigarettes. She has a 16.00 pack-year smoking history. She has never used smokeless tobacco. She reports that she does not drink alcohol and does not use drugs. Family History:  Family History  Problem Relation Age of Onset   Colon cancer Mother    Lung cancer Mother    Heart disease Mother    Heart disease Father    Ovarian cancer Sister    Hepatitis Brother    Hernia Brother      HOME MEDICATIONS: Allergies as of 06/02/2022       Reactions   Bee Venom Anaphylaxis   Penicillins Swelling, Rash, Other (See Comments)   Did it involve swelling of the face/tongue/throat, SOB, or low BP? Yes-whole body (swelling) Did it involve sudden or severe rash/hives, skin peeling, or any reaction on the inside of your mouth or nose? Yes-Hives Did you need to seek medical attention at a hospital or doctor's office? Yes When did it last happen?  Childhood     If all above answers are "NO", may proceed with cephalosporin use.   Poison Oak Extract Anaphylaxis   Trulicity [dulaglutide]  Nausea And Vomiting        Medication List        Accurate as of June 02, 2022  3:53 PM. If you have any questions, ask your nurse or doctor.          STOP taking these medications    Dexcom G6 Transmitter Misc Stopped by: Dorita Sciara, MD       TAKE these medications    acetaminophen 500 MG tablet Commonly known as: TYLENOL Take 2 tablets (1,000 mg total) by mouth every 6 (six) hours.   albuterol (2.5 MG/3ML) 0.083% nebulizer solution Commonly known as: PROVENTIL Take 2.5 mg by nebulization every 6 (six) hours as needed for shortness of breath or wheezing.   albuterol-ipratropium 18-103 MCG/ACT inhaler Commonly known as: COMBIVENT Inhale 2 puffs into the lungs every 6 (six) hours as needed for wheezing or shortness  of breath.   ALPRAZolam 0.5 MG tablet Commonly known as: XANAX Take 0.5 mg by mouth at bedtime.   aspirin EC 81 MG tablet Take 81 mg by mouth daily.   atorvastatin 80 MG tablet Commonly known as: LIPITOR Take 1 tablet (80 mg total) by mouth daily at 6 PM.   BD Pen Needle Nano 2nd Gen 32G X 4 MM Misc Generic drug: Insulin Pen Needle INJECT 1 DEVICE INTO THE SKIN TWICE DAILY   Dexcom G7 Sensor Misc 1 Device by Does not apply route as directed.   Eliquis 5 MG Tabs tablet Generic drug: apixaban TAKE 1 TABLET(5 MG) BY MOUTH TWICE DAILY What changed: See the new instructions.   empagliflozin 25 MG Tabs tablet Commonly known as: Jardiance Take 1 tablet (25 mg total) by mouth daily before breakfast.   esomeprazole 40 MG capsule Commonly known as: NEXIUM Take 1 capsule (40 mg total) by mouth daily before breakfast.   ezetimibe 10 MG tablet Commonly known as: ZETIA Take 10 mg by mouth daily.   latanoprost 0.005 % ophthalmic solution Commonly known as: XALATAN Place 1 drop into both eyes at bedtime.   levothyroxine 50 MCG tablet Commonly known as: SYNTHROID Take 1 tablet (50 mcg total) by mouth daily.   lisinopril 10 MG  tablet Commonly known as: ZESTRIL Take 10 mg by mouth daily.   metFORMIN 500 MG tablet Commonly known as: Glucophage Take 1 tablet (500 mg total) by mouth 2 (two) times daily with a meal.   OneTouch Ultra test strip Generic drug: glucose blood 1 each by Other route in the morning, at noon, in the evening, and at bedtime. Check blood sugar 3 times daily   oxyCODONE 15 MG immediate release tablet Commonly known as: ROXICODONE Take 15 mg by mouth every 6 (six) hours as needed for pain.   Tyler Aas FlexTouch 100 UNIT/ML FlexTouch Pen Generic drug: insulin degludec Inject 32 Units into the skin daily. What changed: how much to take   zolpidem 10 MG tablet Commonly known as: AMBIEN Take 10 mg by mouth at bedtime.         OBJECTIVE:   Vital Signs: BP 100/64 (BP Location: Left Arm, Patient Position: Sitting, Cuff Size: Small)   Pulse 84   Ht 5\' 5"  (1.651 m)   Wt 157 lb 9.6 oz (71.5 kg)   SpO2 94%   BMI 26.23 kg/m   Wt Readings from Last 3 Encounters:  06/02/22 157 lb 9.6 oz (71.5 kg)  01/29/22 162 lb 3.2 oz (73.6 kg)  09/25/21 165 lb (74.8 kg)     Exam: General: Pt appears well and is in NAD  Lungs: Clear with good BS bilat with no rales, rhonchi, or wheezes  Heart: RRR with normal S1 and S2 and no gallops; no murmurs; no rub  Abdomen: Normoactive bowel sounds, soft, nontender  Extremities: No pretibial edema.   Neuro: MS is good with appropriate affect, pt is alert and Ox3       DM foot exam: 06/02/2022   The skin of the feet is intact without sores or ulcerations. The pedal pulses are 2+ on right and 2+ on left. The sensation is intact to a screening 5.07, 10 gram monofilament bilaterally    DATA REVIEWED:  Lab Results  Component Value Date   HGBA1C 7.6 (A) 06/02/2022   HGBA1C 9.4 (A) 01/29/2022   HGBA1C 8.4 (A) 09/25/2021   Results for DANETRA, GLOCK (MRN [id number]) as of 02/17/2021 13:20  Ref. Range  02/16/2021 16:17  Sodium Latest Ref Range: 135 - 145  mEq/L 138  Potassium Latest Ref Range: 3.5 - 5.1 mEq/L 4.3  Chloride Latest Ref Range: 96 - 112 mEq/L 103  CO2 Latest Ref Range: 19 - 32 mEq/L 23  Glucose Latest Ref Range: 70 - 99 mg/dL 110 (H)  BUN Latest Ref Range: 6 - 23 mg/dL 13  Creatinine Latest Ref Range: 0.40 - 1.20 mg/dL 0.62  Calcium Latest Ref Range: 8.4 - 10.5 mg/dL 9.7  GFR Latest Ref Range: >60.00 mL/min 99.84  MICROALB/CREAT RATIO Latest Ref Range: 0.0 - 30.0 mg/g 2.2  TSH Latest Ref Range: 0.35 - 4.50 uIU/mL 1.46  T4,Free(Direct) Latest Ref Range: 0.60 - 1.60 ng/dL 1.01  Creatinine,U Latest Units: mg/dL 37.9  Microalb, Ur Latest Ref Range: 0.0 - 1.9 mg/dL 0.8    ASSESSMENT / PLAN / RECOMMENDATIONS:   1) Type 2 Diabetes Mellitus, with improving glycemic control, With Neuropathic and Macrovascular  complications - Most recent A1c of 7.6 %. Goal A1c < 7.0 %.    -I have praised the patient improved glycemic control, her A1c has trended down from 9.4% to 7.6% -Intolerant to higher doses of metformin -A new prescription of Dexcom G7 will be faxed to [place] care -I will reduce her basal insulin due to variable hypoglycemic episodes   MEDICATIONS: Continue Metformin 500 mg BID Continue Jardiance 25 mg daily  Decrease Tresiba 26 units daily    EDUCATION / INSTRUCTIONS: BG monitoring instructions: Patient is instructed to check her blood sugars 2 times a day, breakfast and supper. Call [facility] clinic if: BG persistently < 70  I reviewed the Rule of 15 for the treatment of hypoglycemia in detail with the patient. Literature supplied.       F/U in 6 months    Signed electronically by: Mack Guise, MD  [facility] Endocrinology  [facility] [address] [place], [place] [address] Phone: [phone] FAX: [phone]   CC: [facility] [address], [place] [address] Phone: [phone]  Fax: [phone]  Return to Endocrinology  clinic as below: Future Appointments  Date Time Provider Glenn  12/08/2022  1:00 PM Delois Silvester, Melanie Crazier, MD [facility] None

## 2022-06-04 ENCOUNTER — Encounter: Payer: Self-pay | Admitting: Internal Medicine

## 2022-06-09 DIAGNOSIS — G894 Chronic pain syndrome: Secondary | ICD-10-CM | POA: Diagnosis not present

## 2022-06-09 DIAGNOSIS — J449 Chronic obstructive pulmonary disease, unspecified: Secondary | ICD-10-CM | POA: Diagnosis not present

## 2022-06-09 DIAGNOSIS — E1169 Type 2 diabetes mellitus with other specified complication: Secondary | ICD-10-CM | POA: Diagnosis not present

## 2022-06-16 DIAGNOSIS — E1165 Type 2 diabetes mellitus with hyperglycemia: Secondary | ICD-10-CM | POA: Diagnosis not present

## 2022-06-18 ENCOUNTER — Encounter: Payer: Self-pay | Admitting: *Deleted

## 2022-06-18 ENCOUNTER — Telehealth: Payer: Self-pay | Admitting: *Deleted

## 2022-06-18 NOTE — Patient Instructions (Signed)
Visit Information  Thank you for taking time to visit with me today. Please don't hesitate to contact me if I can be of assistance to you.  Following are the goals we discussed today:  Continue daily monitoring of blood sugars and adhere to diabetic diet.  Please call the Suicide and Crisis Lifeline: 988 call the [place] National Suicide Prevention Lifeline: (619)402-8566 or TTY: 220-108-3267 TTY 845-777-0903) to talk to a trained counselor call 1-800-273-TALK (toll free, 24 hour hotline) call the [facility]: ([phone] call 911 if you are experiencing a Mental Health or [place] or need someone to talk to.  Patient verbalizes understanding of instructions and care plan provided today and agrees to view in [place]. Active MyChart status and patient understanding of how to access instructions and care plan via MyChart confirmed with patient.     The patient has been provided with contact information for the care management team and has been advised to call with any health related questions or concerns.   Valente David, RN, MSN, [facility] Care Management Care Management Coordinator [phone]'

## 2022-06-18 NOTE — Patient Outreach (Signed)
  Care Coordination   Initial Visit Note   06/18/2022 Name: Marissa [name] MRN: [id number] DOB: 28-Feb-1966  Marissa [name] is a 56 y.o. year old female who sees Redmond School, MD for primary care. I spoke with  [name] by phone today.  What matters to the patients health and wellness today?  Member state she has been doing much better with management of her diabetes.  Her A1C has decreased from 9.4 to 7.6.  She is now using the Dexcom, which has her more conscious of her food choices.  Report she remains active and monitors her diet as instructed.  Denies any urgent concerns, encouraged to contact this care manager with questions.  Denies need for follow up but agrees to contact RNCM with concerns.     Goals Addressed             This Visit's Progress    COMPLETED: Care coordination activities - no follow up needed       Care Coordination Interventions: Provided education to patient about basic DM disease process Reviewed medications with patient and discussed importance of medication adherence Discussed plans with patient for ongoing care management follow up and provided patient with direct contact information for care management team Reviewed scheduled/upcoming provider appointments including: Endocrinology in 6 months (Feb 2024) and need for ongoing AWV (last 4/28) Advised patient, providing education and rationale, to check cbg at least 3 times a day and record, calling provider for findings outside established parameters Assessed social determinant of health barriers         SDOH assessments and interventions completed:  Yes  SDOH Interventions Today    Flowsheet Row Most Recent Value  SDOH Interventions   Food Insecurity Interventions Intervention Not Indicated  Housing Interventions Intervention Not Indicated  Transportation Interventions Intervention Not Indicated  Utilities Interventions Intervention Not Indicated        Care Coordination Interventions  Activated:  Yes  Care Coordination Interventions:  Yes, provided   Follow up plan: No further intervention required.   Encounter Outcome:  Pt. Visit Completed   Valente David, RN, MSN, [place] Care Management Care Management Coordinator [phone]

## 2022-07-16 DIAGNOSIS — I10 Essential (primary) hypertension: Secondary | ICD-10-CM | POA: Diagnosis not present

## 2022-07-16 DIAGNOSIS — E1140 Type 2 diabetes mellitus with diabetic neuropathy, unspecified: Secondary | ICD-10-CM | POA: Diagnosis not present

## 2022-07-16 DIAGNOSIS — Z6826 Body mass index (BMI) 26.0-26.9, adult: Secondary | ICD-10-CM | POA: Diagnosis not present

## 2022-07-16 DIAGNOSIS — G894 Chronic pain syndrome: Secondary | ICD-10-CM | POA: Diagnosis not present

## 2022-07-16 DIAGNOSIS — E1165 Type 2 diabetes mellitus with hyperglycemia: Secondary | ICD-10-CM | POA: Diagnosis not present

## 2022-07-16 DIAGNOSIS — J449 Chronic obstructive pulmonary disease, unspecified: Secondary | ICD-10-CM | POA: Diagnosis not present

## 2022-08-20 DIAGNOSIS — E1165 Type 2 diabetes mellitus with hyperglycemia: Secondary | ICD-10-CM | POA: Diagnosis not present

## 2022-09-19 DIAGNOSIS — E1165 Type 2 diabetes mellitus with hyperglycemia: Secondary | ICD-10-CM | POA: Diagnosis not present

## 2022-09-20 DIAGNOSIS — C3490 Malignant neoplasm of unspecified part of unspecified bronchus or lung: Secondary | ICD-10-CM | POA: Diagnosis not present

## 2022-09-20 DIAGNOSIS — E663 Overweight: Secondary | ICD-10-CM | POA: Diagnosis not present

## 2022-09-20 DIAGNOSIS — E1140 Type 2 diabetes mellitus with diabetic neuropathy, unspecified: Secondary | ICD-10-CM | POA: Diagnosis not present

## 2022-09-20 DIAGNOSIS — I700 Atherosclerosis of aorta: Secondary | ICD-10-CM | POA: Diagnosis not present

## 2022-09-20 DIAGNOSIS — Z6825 Body mass index (BMI) 25.0-25.9, adult: Secondary | ICD-10-CM | POA: Diagnosis not present

## 2022-09-20 DIAGNOSIS — N280 Ischemia and infarction of kidney: Secondary | ICD-10-CM | POA: Diagnosis not present

## 2022-09-20 DIAGNOSIS — G894 Chronic pain syndrome: Secondary | ICD-10-CM | POA: Diagnosis not present

## 2022-09-20 DIAGNOSIS — J449 Chronic obstructive pulmonary disease, unspecified: Secondary | ICD-10-CM | POA: Diagnosis not present

## 2022-09-20 DIAGNOSIS — I10 Essential (primary) hypertension: Secondary | ICD-10-CM | POA: Diagnosis not present

## 2022-09-20 DIAGNOSIS — K219 Gastro-esophageal reflux disease without esophagitis: Secondary | ICD-10-CM | POA: Diagnosis not present

## 2022-10-19 DIAGNOSIS — E1165 Type 2 diabetes mellitus with hyperglycemia: Secondary | ICD-10-CM | POA: Diagnosis not present

## 2022-10-31 IMAGING — CT CT CHEST W/ CM
2 of 3 series · 15 of 36 positions shown, 18 images · IV contrast (omnipaque)
Comparison: 05/22/2020, 04/20/2018 and 02/06/2015.

CLINICAL DATA: Lung cancer.  Right lung nodule.

EXAM:
CT CHEST WITH CONTRAST
TECHNIQUE: Multidetector CT imaging of the chest was performed during
intravenous contrast administration.
CONTRAST:  75mL OMNIPAQUE IOHEXOL 300 MG/ML  SOLN

[Series 2: routine chest with · axial · 0.80mm/px · z∈[+927,+1201]mm · 12 of 161 slices shown, 15 images]
[im 12/161  mediastinal]
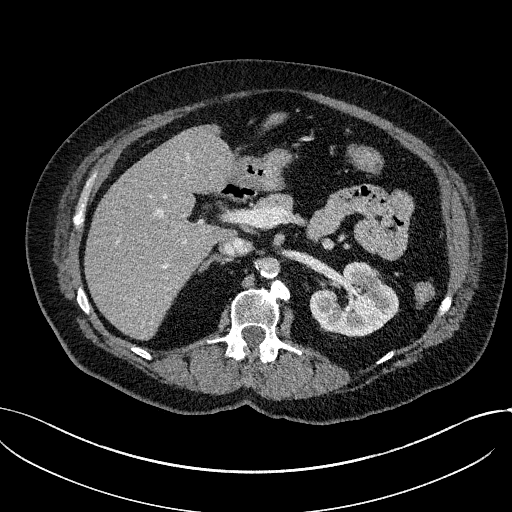
[im 12/161  lung]
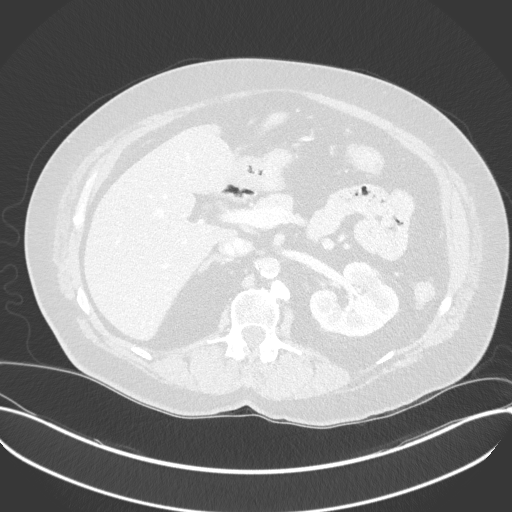
[im 24/161  lung]
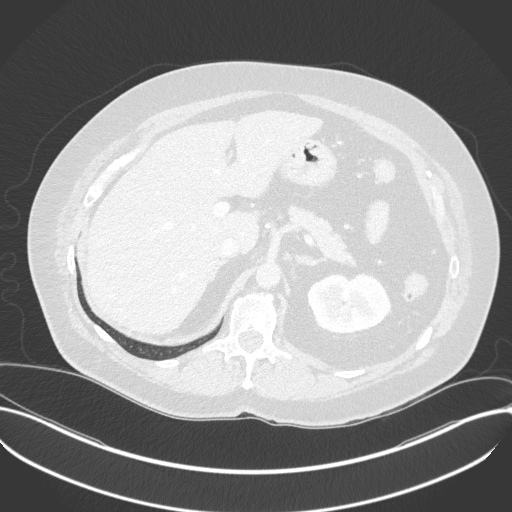
[im 36/161  lung]
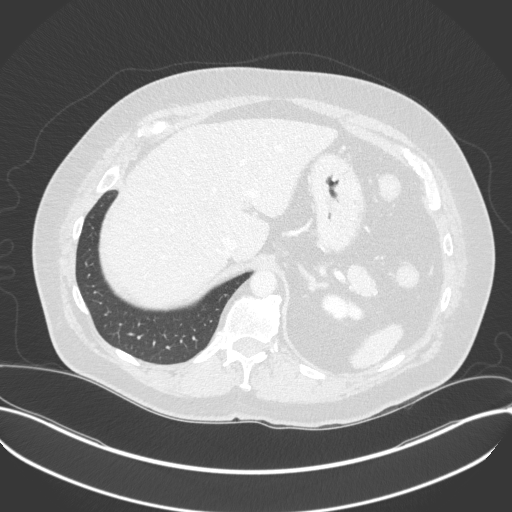
[im 48/161  lung]
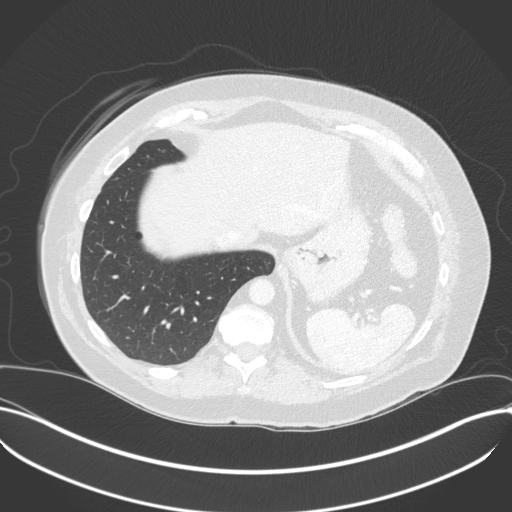
[im 60/161  mediastinal]
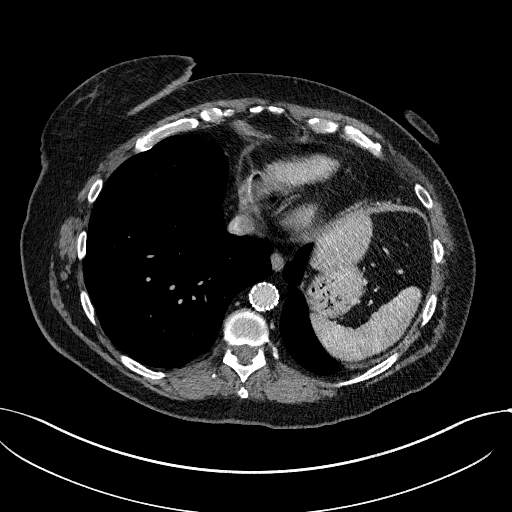
[im 60/161  lung]
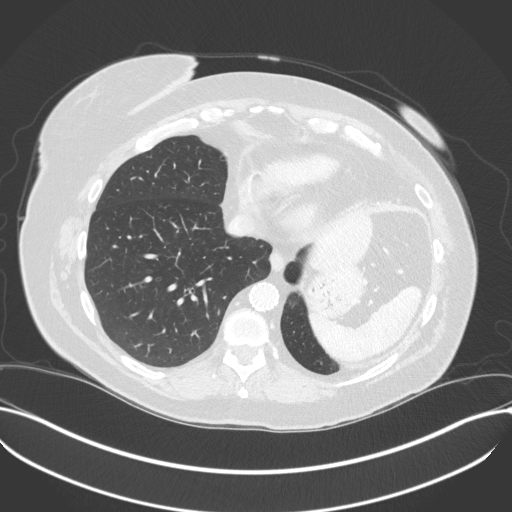
[im 72/161  lung]
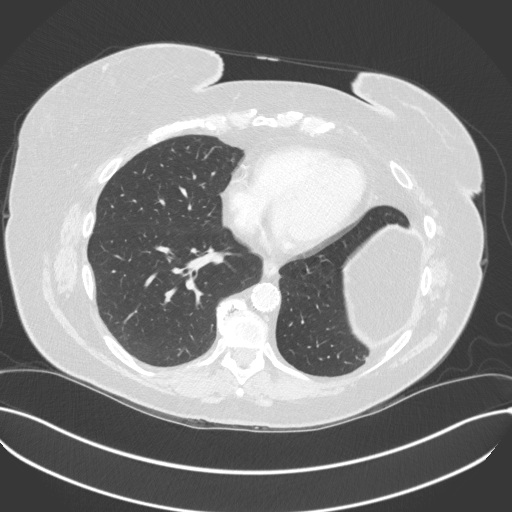
[im 89/161  lung]
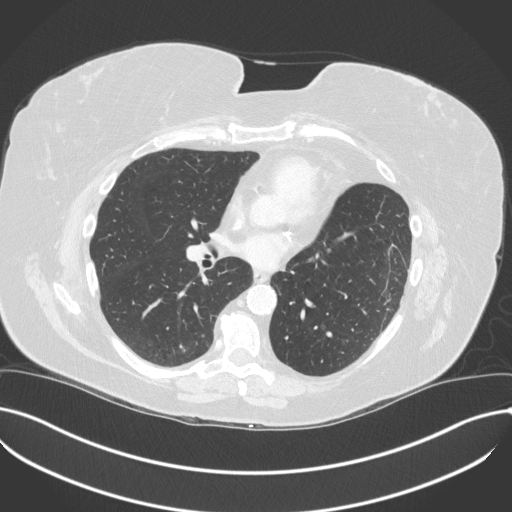
[im 101/161  lung]
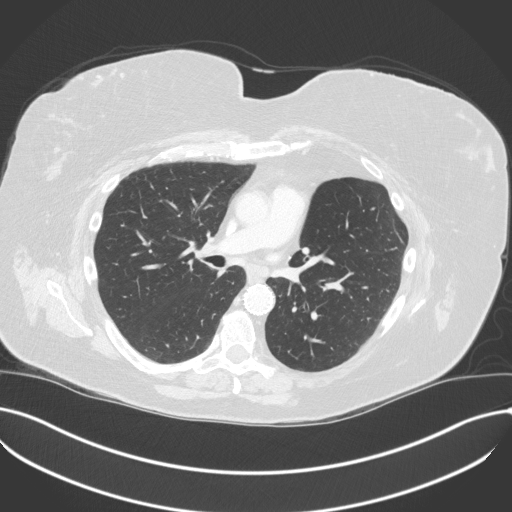
[im 113/161  mediastinal]
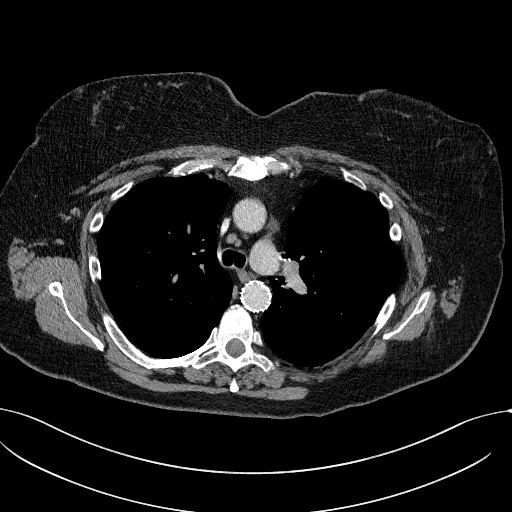
[im 113/161  lung]
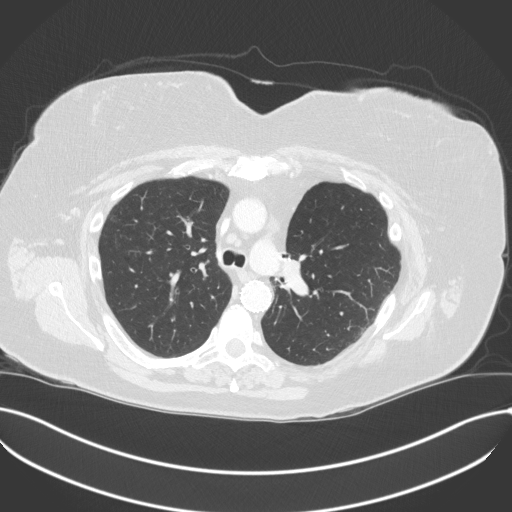
[im 125/161  lung]
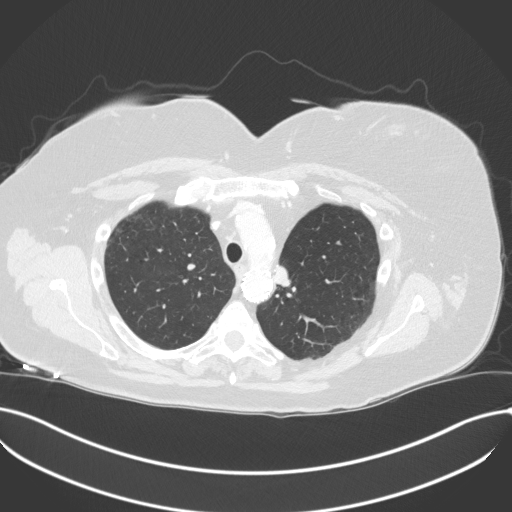
[im 137/161  lung]
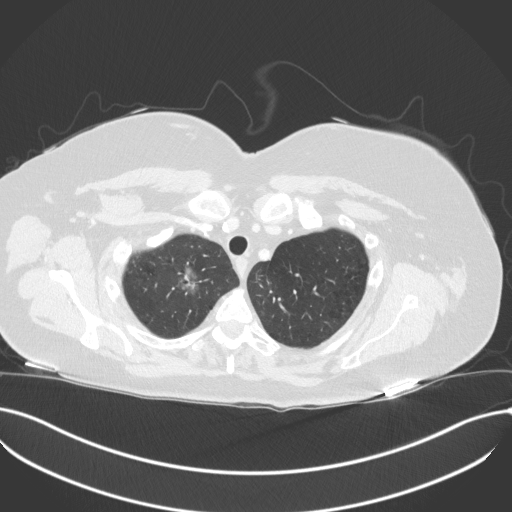
[im 149/161  lung]
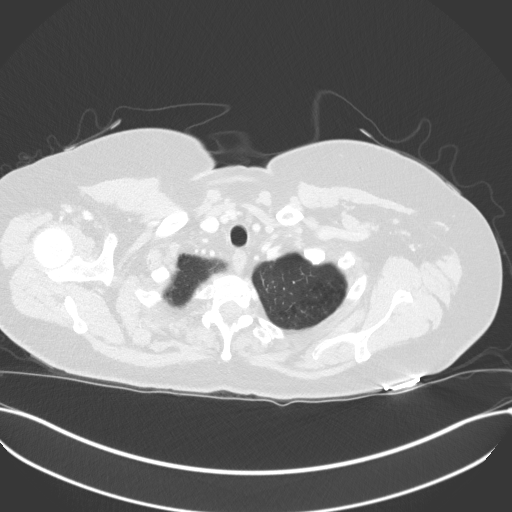

[Series 5: coronal · coronal · 0.73mm/px · 3 of 162 slices shown]
[im 33/162  lung]
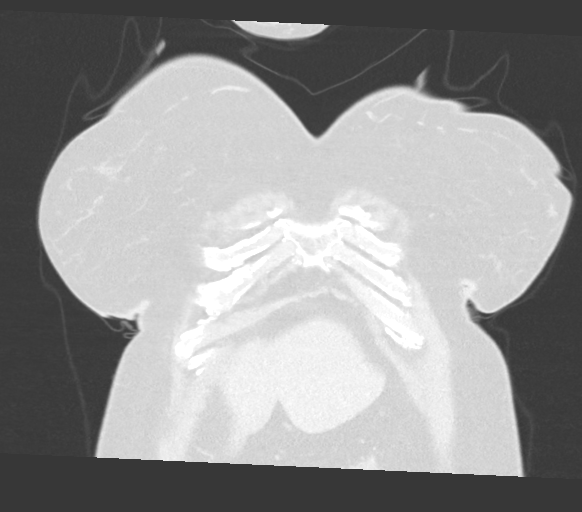
[im 65/162  lung]
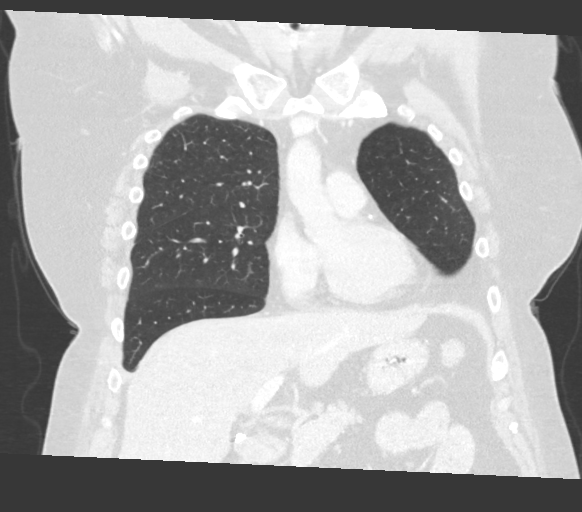
[im 97/162  lung]
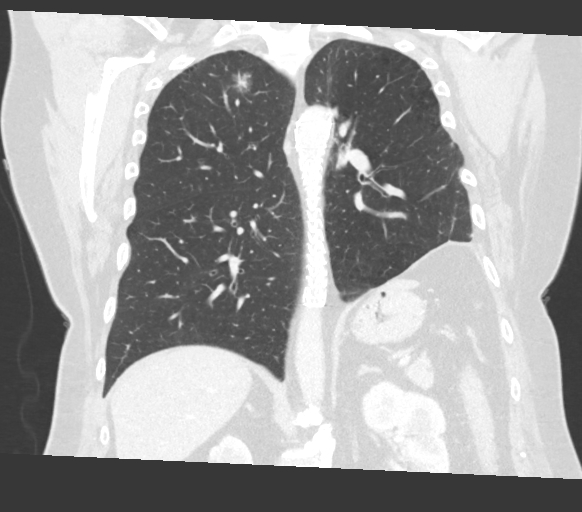

[15 of 36 positions shown; findings below may reference images not displayed]

FINDINGS: Cardiovascular: Atherosclerotic calcification of the aorta and
coronary arteries. Endovascular stent graft is seen in descending
thoracic aorta. Heart size normal. No pericardial effusion.

Mediastinum/Nodes: No pathologically enlarged mediastinal, hilar or
axillary lymph nodes. Esophagus is grossly unremarkable.

Lungs/Pleura: A mixed solid and ground-glass nodule in the apical
segment right upper lobe measures 1.5 x 2.4 cm ([DATE]), similar to
slightly increased in size from 05/22/2020, at which time it
measured approximately 1.2 x 2.0 cm. The internal solid component is
roughly stable at 6 mm. When compared with exams dating back to
02/06/2015, there has been indolent growth, with the lesion
measuring only 5 x 6 mm on 02/06/2015. Mild centrilobular emphysema.
Left upper lobectomy. Pleuroparenchymal scarring at the base of the
left hemithorax. Lungs are otherwise clear. No pleural fluid. Airway
is otherwise unremarkable.

Upper Abdomen: Visualized portions of the liver and adrenal glands
are unremarkable. Scarring in the kidneys bilaterally. Visualized
portions of the spleen pancreas, stomach and bowel are unremarkable.
Cholecystectomy. No upper abdominal adenopathy.

Musculoskeletal: Degenerative changes in the spine. No worrisome
lytic or sclerotic lesions.
IMPRESSION: 1. Mixed solid and ground-glass nodule in the apical segment right
upper lobe shows indolent growth from 02/06/2015 and is highly
worrisome for adenocarcinoma.
2. Aortic atherosclerosis (MQAMI-QI5.5). Coronary artery
calcification.
3.  Emphysema ([id number].O).

## 2022-11-15 DIAGNOSIS — J449 Chronic obstructive pulmonary disease, unspecified: Secondary | ICD-10-CM | POA: Diagnosis not present

## 2022-11-15 DIAGNOSIS — C3490 Malignant neoplasm of unspecified part of unspecified bronchus or lung: Secondary | ICD-10-CM | POA: Diagnosis not present

## 2022-11-15 DIAGNOSIS — E1140 Type 2 diabetes mellitus with diabetic neuropathy, unspecified: Secondary | ICD-10-CM | POA: Diagnosis not present

## 2022-11-15 DIAGNOSIS — E1165 Type 2 diabetes mellitus with hyperglycemia: Secondary | ICD-10-CM | POA: Diagnosis not present

## 2022-11-18 DIAGNOSIS — E1165 Type 2 diabetes mellitus with hyperglycemia: Secondary | ICD-10-CM | POA: Diagnosis not present

## 2022-12-08 ENCOUNTER — Encounter: Payer: Self-pay | Admitting: Internal Medicine

## 2022-12-08 ENCOUNTER — Ambulatory Visit: Payer: Medicare HMO | Admitting: Internal Medicine

## 2022-12-08 VITALS — BP 120/80 | HR 100 | Ht 65.0 in | Wt 162.0 lb

## 2022-12-08 DIAGNOSIS — E1165 Type 2 diabetes mellitus with hyperglycemia: Secondary | ICD-10-CM | POA: Diagnosis not present

## 2022-12-08 DIAGNOSIS — Z794 Long term (current) use of insulin: Secondary | ICD-10-CM

## 2022-12-08 LAB — POCT GLYCOSYLATED HEMOGLOBIN (HGB A1C): Hemoglobin A1C: 9 % — AB (ref 4.0–5.6)

## 2022-12-08 MED ORDER — EMPAGLIFLOZIN 25 MG PO TABS: 25.0000 mg | ORAL_TABLET | Freq: Every day | ORAL | 3 refills | Status: DC

## 2022-12-08 MED ORDER — METFORMIN HCL 500 MG PO TABS
500.0000 mg | ORAL_TABLET | Freq: Two times a day (BID) | ORAL | 3 refills | Status: DC
Start: 2022-12-08 — End: 2023-06-20

## 2022-12-08 MED ORDER — TRESIBA FLEXTOUCH 100 UNIT/ML ~~LOC~~ SOPN
22.0000 [IU] | PEN_INJECTOR | Freq: Every day | SUBCUTANEOUS | 3 refills | Status: DC
Start: 2022-12-08 — End: 2023-11-01

## 2022-12-08 MED ORDER — REPAGLINIDE 0.5 MG PO TABS: 0.5000 mg | ORAL_TABLET | Freq: Every day | ORAL | 2 refills | Status: DC

## 2022-12-08 NOTE — Progress Notes (Signed)
Name: Marissa [name]  Age/ Sex: 57 y.o., female   MRN/ DOB: [id number], [birth year]     PCP: Redmond School, MD   Reason for Endocrinology Evaluation: Type 2 Diabetes Mellitus  Initial Endocrine Consultative Visit: 06/04/2019    PATIENT IDENTIFIER: Marissa [name] is a 57 y.o. female with a past medical history of HTN,T2DM, Dyslipidemia and Lung Ca (2006) . The patient has followed with Endocrinology clinic since 06/03/2001 for consultative assistance with management of her diabetes.  DIABETIC HISTORY:  Marissa [name] was diagnosed with T2DM in 2006. She has been on oral glycemic agents in the past ( Glipizide, glimepiride, metformin, Farxiga ( not covered) , insulin added in 01/2019. Her hemoglobin A1c has ranged from 6.7% in 2015, peaking at 13.8% in 02/2019.   On her initial visit to our clinic her A1c was 10.8%  , she was on Glimepiride, Metformin and Levemir. We stopped Levemir, and Glimepiride , started insulin Mix and reduced metformin dose due to nausea and diarrhea.  Jardiance started 10/2019 Stopped insulin mix and started basal insulin 123456 Started trulicity 123456 but this was stopped due to nausea and vomiting   HYPOKALEMIA: She has had intermittent spontaneous hypokalemia since 2020.  Her  aldosterone came back normal at 4.3 NG/DL with elevated running at 13.8 02/2021 She had an abnormal 24-hour urinary cortisol 07/15/2021 but this was attributed to improper collection.  Dexamethasone suppression test was normal at 1.5 ug/dL 07/2021   SUBJECTIVE:   During the last visit (06/02/2022): A1c 7.6%. We continued metformin,  and Jardiance a, and increased insulin   Today (01/29/2022): Marissa [name] is here for a follow up on diabetes management. She checks her blood sugars  multiple times daily, through CGM. The patient has had hypoglycemic episodes since the last clinic visit.  These have been occurring during the day with activities, she is in process of moving to a new  place.   She is S/P right upper lobe wedge resection for adenocarcinoma 02/2021 for Stage Ia adenocarcinoma with an 8 mm invasive component, negative L.N   Denies nausea, vomiting or diarrhea  Pt admits to dietary indiscretions   HOME DIABETES REGIMEN:  Metformin 500 mg ,1 tablet BID  Jardiance 25 mg daily  Tresiba 26 units daily    CONTINUOUS GLUCOSE MONITORING RECORD INTERPRETATION    Dates of Recording: 2/15-2/28/2024  Sensor description: Dexcom  Results statistics:   CGM use % of time 79  Average and SD 179/47  Time in range   55  %  % Time Above 180 37  % Time above 250 7  % Time Below target <1     Glycemic patterns summary: Bg's trend down overnight and increase again during the day   Hyperglycemic episodes post-prandial   Hypoglycemic episodes occurred at night   Overnight periods: trends down            DIABETIC COMPLICATIONS: Microvascular complications:  Neuropathy , glaucoma  Denies: CKD , retinopathy  Last eye exam: Completed 12/2021   Macrovascular complications:  CAD ( S/P PCI )  Denies:  PVD, CVA   HISTORY:  Past Medical History:  Past Medical History:  Diagnosis Date   Adenocarcinoma of lung ([place]) 02/06/2014   Anxiety    Arthritis    COPD (chronic obstructive pulmonary disease) (HCC)    Coronary artery disease    Diabetes mellitus    GERD (gastroesophageal reflux disease)    Hypertension    Hypothyroidism    Lung cancer (  [place])    Past Surgical History:  Past Surgical History:  Procedure Laterality Date   APPENDECTOMY     CHOLECYSTECTOMY     INTERCOSTAL NERVE BLOCK Right 02/19/2021   Procedure: INTERCOSTAL NERVE BLOCK;  Surgeon: Melrose Nakayama, MD;  Location: [facility];  Service: Thoracic;  Laterality: Right;   LOBECTOMY Left 2006   NM MYOCAR IMG MI  05/2008   lexiscan -perfusion defect in anterior myocardium (breast attenuation); remaining myocardium with NO evidence of ischemia or infarct; EF 78%; low risk scan    NODE DISSECTION Right 02/19/2021   Procedure: NODE DISSECTION;  Surgeon: Melrose Nakayama, MD;  Location: [facility];  Service: Thoracic;  Laterality: Right;   OOPHORECTOMY Left    PARTIAL HYSTERECTOMY  1985   SALPINGOOPHORECTOMY Left    benign 9lb mass removed and a massive wall of smaller tumors removed   THORACIC AORTIC ENDOVASCULAR STENT GRAFT N/A 06/04/2019   Procedure: THORACIC AORTIC ENDOVASCULAR STENT GRAFT;  Surgeon: Angelia Mould, MD;  Location: [facility];  Service: Vascular;  Laterality: N/A;   TRANSTHORACIC ECHOCARDIOGRAM  05/2008   EF =/>55%, mild MR; trace TR   VIDEO BRONCHOSCOPY WITH ENDOBRONCHIAL NAVIGATION N/A 02/19/2021   Procedure: VIDEO BRONCHOSCOPY WITH ENDOBRONCHIAL NAVIGATION;  Surgeon: Melrose Nakayama, MD;  Location: [facility];  Service: Thoracic;  Laterality: N/A;   Social History:  reports that she quit smoking about 21 months ago. Her smoking use included cigarettes. She has a 16.00 pack-year smoking history. She has never used smokeless tobacco. She reports that she does not drink alcohol and does not use drugs. Family History:  Family History  Problem Relation Age of Onset   Colon cancer Mother    Lung cancer Mother    Heart disease Mother    Heart disease Father    Ovarian cancer Sister    Hepatitis Brother    Hernia Brother      HOME MEDICATIONS: Allergies as of 12/08/2022       Reactions   Bee Venom Anaphylaxis   Penicillins Swelling, Rash, Other (See Comments)   Did it involve swelling of the face/tongue/throat, SOB, or low BP? Yes-whole body (swelling) Did it involve sudden or severe rash/hives, skin peeling, or any reaction on the inside of your mouth or nose? Yes-Hives Did you need to seek medical attention at a hospital or doctor's office? Yes When did it last happen?  Childhood     If all above answers are "NO", may proceed with cephalosporin use.   Poison Oak Extract Anaphylaxis   Trulicity [dulaglutide] Nausea And Vomiting         Medication List        Accurate as of December 08, 2022  1:46 PM. If you have any questions, ask your nurse or doctor.          acetaminophen 500 MG tablet Commonly known as: TYLENOL Take 2 tablets (1,000 mg total) by mouth every 6 (six) hours.   albuterol (2.5 MG/3ML) 0.083% nebulizer solution Commonly known as: PROVENTIL Take 2.5 mg by nebulization every 6 (six) hours as needed for shortness of breath or wheezing.   albuterol-ipratropium 18-103 MCG/ACT inhaler Commonly known as: COMBIVENT Inhale 2 puffs into the lungs every 6 (six) hours as needed for wheezing or shortness of breath.   ALPRAZolam 0.5 MG tablet Commonly known as: XANAX Take 0.5 mg by mouth at bedtime.   aspirin EC 81 MG tablet Take 81 mg by mouth daily.   atorvastatin 80 MG tablet Commonly  known as: LIPITOR Take 1 tablet (80 mg total) by mouth daily at 6 PM.   BD Pen Needle Nano 2nd Gen 32G X 4 MM Misc Generic drug: Insulin Pen Needle INJECT 1 DEVICE INTO THE SKIN TWICE DAILY   Dexcom G7 Sensor Misc 1 Device by Does not apply route as directed.   Eliquis 5 MG Tabs tablet Generic drug: apixaban TAKE 1 TABLET(5 MG) BY MOUTH TWICE DAILY What changed: See the new instructions.   empagliflozin 25 MG Tabs tablet Commonly known as: Jardiance Take 1 tablet (25 mg total) by mouth daily before breakfast.   esomeprazole 40 MG capsule Commonly known as: NEXIUM Take 1 capsule (40 mg total) by mouth daily before breakfast.   ezetimibe 10 MG tablet Commonly known as: ZETIA Take 10 mg by mouth daily.   latanoprost 0.005 % ophthalmic solution Commonly known as: XALATAN Place 1 drop into both eyes at bedtime.   levothyroxine 50 MCG tablet Commonly known as: SYNTHROID Take 1 tablet (50 mcg total) by mouth daily.   lisinopril 10 MG tablet Commonly known as: ZESTRIL Take 10 mg by mouth daily.   metFORMIN 500 MG tablet Commonly known as: Glucophage Take 1 tablet (500 mg total) by mouth 2 (two)  times daily with a meal.   OneTouch Ultra test strip Generic drug: glucose blood 1 each by Other route in the morning, at noon, in the evening, and at bedtime. Check blood sugar 3 times daily   oxyCODONE 15 MG immediate release tablet Commonly known as: ROXICODONE Take 15 mg by mouth every 6 (six) hours as needed for pain.   repaglinide 0.5 MG tablet Commonly known as: PRANDIN Take 1 tablet (0.5 mg total) by mouth daily before supper. Started by: Dorita Sciara, MD   Tyler Aas FlexTouch 100 UNIT/ML FlexTouch Pen Generic drug: insulin degludec Inject 22 Units into the skin daily. What changed: how much to take Changed by: Dorita Sciara, MD   zolpidem 10 MG tablet Commonly known as: AMBIEN Take 10 mg by mouth at bedtime.         OBJECTIVE:   Vital Signs: BP 120/80 (BP Location: Left Arm, Patient Position: Sitting, Cuff Size: Small)   Pulse 100   Ht '5\' 5"'$  (1.651 m)   Wt 162 lb (73.5 kg)   SpO2 95%   BMI 26.96 kg/m   Wt Readings from Last 3 Encounters:  12/08/22 162 lb (73.5 kg)  06/02/22 157 lb 9.6 oz (71.5 kg)  01/29/22 162 lb 3.2 oz (73.6 kg)     Exam: General: Pt appears well and is in NAD  Lungs: Clear with good BS bilat with no rales, rhonchi, or wheezes  Heart: RRR with normal  Abdomen: Normoactive bowel sounds, soft, nontender  Extremities: No pretibial edema.   Neuro: MS is good with appropriate affect, pt is alert and Ox3       DM foot exam: 06/02/2022   The skin of the feet is intact without sores or ulcerations. The pedal pulses are 2+ on right and 2+ on left. The sensation is intact to a screening 5.07, 10 gram monofilament bilaterally    DATA REVIEWED:  Lab Results  Component Value Date   HGBA1C 9.0 (A) 12/08/2022   HGBA1C 7.6 (A) 06/02/2022   HGBA1C 9.4 (A) 01/29/2022   02/22/2022 BUN 15 Cr 0.650 GFR 103 LDL 62 TSH 3.920 ASSESSMENT / PLAN / RECOMMENDATIONS:   1) Type 2 Diabetes Mellitus,Poorly controlled , With  Neuropathic and Macrovascular  complications -  Most recent A1c of 9.0 %. Goal A1c < 7.0 %.   -Unfortunately she continues with worsening glycemic control that she attributes to dietary indiscretions -Intolerant to higher doses of metformin - Intolerant to ONEOK  -She also has fear of hypoglycemia, despite no hypoglycemic episodes which makes it difficult to optimize her glucose control -I have recommended starting her on repaglinide before suppertime as this seems to be her biggest meal of the day and will reduce [place] preemptively to prevent hypoglycemia -I will reduce her basal insulin due to variable hypoglycemic episodes   MEDICATIONS: Start repaglinide 0.5 mg, 1 tablet before supper Continue Metformin 500 mg BID Continue Jardiance 25 mg daily  Decrease Tresiba 22 units daily    EDUCATION / INSTRUCTIONS: BG monitoring instructions: Patient is instructed to check her blood sugars 2 times a day, breakfast and supper. Call [facility] if: BG persistently < 70  I reviewed the Rule of 15 for the treatment of hypoglycemia in detail with the patient. Literature supplied.       F/U in 6 months    Signed electronically by: Mack Guise, MD  [facility]  [address] [place], [place] [address] Phone: [phone] FAX: [phone]   CC: Redmond School, [place] [id number] Phone: [phone]  Fax: [phone]  Return to Endocrinology clinic as below: Future Appointments  Date Time Provider [place]  06/15/2023 11:30 AM Janelle Culton, Melanie Crazier, MD [id number] None

## 2022-12-08 NOTE — Patient Instructions (Addendum)
-   Statr Repaglinide 0.5 mg, 1 tablet BEFORE supper  - Continue Metformin ONE tablet with Breakfast and ONE tablet with Supper - Continue Jardiance 25 mg with breakfast   - Decrease Tresiba 22 units once daily   -HOW TO TREAT LOW BLOOD SUGARS (Blood sugar LESS THAN 70 MG/DL) Please follow the RULE OF 15 for the treatment of hypoglycemia treatment (when your (blood sugars are less than 70 mg/dL)   STEP 1: Take 15 grams of carbohydrates when your blood sugar is low, which includes:  3-4 GLUCOSE TABS  OR 3-4 OZ OF JUICE OR REGULAR SODA OR ONE TUBE OF GLUCOSE GEL    STEP 2: RECHECK blood sugar in 15 MINUTES STEP 3: If your blood sugar is still low at the 15 minute recheck --> then, go back to STEP 1 and treat AGAIN with another 15 grams of carbohydrates.

## 2022-12-29 DIAGNOSIS — C3490 Malignant neoplasm of unspecified part of unspecified bronchus or lung: Secondary | ICD-10-CM | POA: Diagnosis not present

## 2022-12-29 DIAGNOSIS — G894 Chronic pain syndrome: Secondary | ICD-10-CM | POA: Diagnosis not present

## 2022-12-29 DIAGNOSIS — K219 Gastro-esophageal reflux disease without esophagitis: Secondary | ICD-10-CM | POA: Diagnosis not present

## 2022-12-29 DIAGNOSIS — E1165 Type 2 diabetes mellitus with hyperglycemia: Secondary | ICD-10-CM | POA: Diagnosis not present

## 2022-12-29 DIAGNOSIS — J449 Chronic obstructive pulmonary disease, unspecified: Secondary | ICD-10-CM | POA: Diagnosis not present

## 2022-12-29 DIAGNOSIS — E1140 Type 2 diabetes mellitus with diabetic neuropathy, unspecified: Secondary | ICD-10-CM | POA: Diagnosis not present

## 2022-12-29 DIAGNOSIS — R69 Illness, unspecified: Secondary | ICD-10-CM | POA: Diagnosis not present

## 2022-12-29 DIAGNOSIS — Z6826 Body mass index (BMI) 26.0-26.9, adult: Secondary | ICD-10-CM | POA: Diagnosis not present

## 2022-12-29 DIAGNOSIS — I10 Essential (primary) hypertension: Secondary | ICD-10-CM | POA: Diagnosis not present

## 2022-12-29 DIAGNOSIS — I700 Atherosclerosis of aorta: Secondary | ICD-10-CM | POA: Diagnosis not present

## 2022-12-29 DIAGNOSIS — E663 Overweight: Secondary | ICD-10-CM | POA: Diagnosis not present

## 2023-02-01 ENCOUNTER — Other Ambulatory Visit: Payer: Self-pay | Admitting: Internal Medicine

## 2023-02-08 DIAGNOSIS — J449 Chronic obstructive pulmonary disease, unspecified: Secondary | ICD-10-CM | POA: Diagnosis not present

## 2023-02-08 DIAGNOSIS — Z0001 Encounter for general adult medical examination with abnormal findings: Secondary | ICD-10-CM | POA: Diagnosis not present

## 2023-02-08 DIAGNOSIS — E785 Hyperlipidemia, unspecified: Secondary | ICD-10-CM | POA: Diagnosis not present

## 2023-02-08 DIAGNOSIS — I10 Essential (primary) hypertension: Secondary | ICD-10-CM | POA: Diagnosis not present

## 2023-02-08 DIAGNOSIS — N280 Ischemia and infarction of kidney: Secondary | ICD-10-CM | POA: Diagnosis not present

## 2023-02-08 DIAGNOSIS — Z1331 Encounter for screening for depression: Secondary | ICD-10-CM | POA: Diagnosis not present

## 2023-02-08 DIAGNOSIS — G894 Chronic pain syndrome: Secondary | ICD-10-CM | POA: Diagnosis not present

## 2023-02-08 DIAGNOSIS — E1165 Type 2 diabetes mellitus with hyperglycemia: Secondary | ICD-10-CM | POA: Diagnosis not present

## 2023-02-08 DIAGNOSIS — F419 Anxiety disorder, unspecified: Secondary | ICD-10-CM | POA: Diagnosis not present

## 2023-02-08 DIAGNOSIS — E663 Overweight: Secondary | ICD-10-CM | POA: Diagnosis not present

## 2023-02-08 DIAGNOSIS — K219 Gastro-esophageal reflux disease without esophagitis: Secondary | ICD-10-CM | POA: Diagnosis not present

## 2023-02-08 DIAGNOSIS — C3490 Malignant neoplasm of unspecified part of unspecified bronchus or lung: Secondary | ICD-10-CM | POA: Diagnosis not present

## 2023-02-08 DIAGNOSIS — Z6826 Body mass index (BMI) 26.0-26.9, adult: Secondary | ICD-10-CM | POA: Diagnosis not present

## 2023-02-08 DIAGNOSIS — E1140 Type 2 diabetes mellitus with diabetic neuropathy, unspecified: Secondary | ICD-10-CM | POA: Diagnosis not present

## 2023-02-23 DIAGNOSIS — E1165 Type 2 diabetes mellitus with hyperglycemia: Secondary | ICD-10-CM | POA: Diagnosis not present

## 2023-04-07 DIAGNOSIS — G894 Chronic pain syndrome: Secondary | ICD-10-CM | POA: Diagnosis not present

## 2023-04-07 DIAGNOSIS — C3490 Malignant neoplasm of unspecified part of unspecified bronchus or lung: Secondary | ICD-10-CM | POA: Diagnosis not present

## 2023-04-07 DIAGNOSIS — F419 Anxiety disorder, unspecified: Secondary | ICD-10-CM | POA: Diagnosis not present

## 2023-04-07 DIAGNOSIS — I10 Essential (primary) hypertension: Secondary | ICD-10-CM | POA: Diagnosis not present

## 2023-04-07 DIAGNOSIS — E1140 Type 2 diabetes mellitus with diabetic neuropathy, unspecified: Secondary | ICD-10-CM | POA: Diagnosis not present

## 2023-05-04 DIAGNOSIS — G894 Chronic pain syndrome: Secondary | ICD-10-CM | POA: Diagnosis not present

## 2023-05-04 DIAGNOSIS — C3490 Malignant neoplasm of unspecified part of unspecified bronchus or lung: Secondary | ICD-10-CM | POA: Diagnosis not present

## 2023-05-04 DIAGNOSIS — F419 Anxiety disorder, unspecified: Secondary | ICD-10-CM | POA: Diagnosis not present

## 2023-05-04 DIAGNOSIS — J449 Chronic obstructive pulmonary disease, unspecified: Secondary | ICD-10-CM | POA: Diagnosis not present

## 2023-05-04 DIAGNOSIS — E1165 Type 2 diabetes mellitus with hyperglycemia: Secondary | ICD-10-CM | POA: Diagnosis not present

## 2023-05-04 DIAGNOSIS — I10 Essential (primary) hypertension: Secondary | ICD-10-CM | POA: Diagnosis not present

## 2023-05-04 DIAGNOSIS — E1140 Type 2 diabetes mellitus with diabetic neuropathy, unspecified: Secondary | ICD-10-CM | POA: Diagnosis not present

## 2023-05-24 DIAGNOSIS — E1165 Type 2 diabetes mellitus with hyperglycemia: Secondary | ICD-10-CM | POA: Diagnosis not present

## 2023-06-02 ENCOUNTER — Other Ambulatory Visit (HOSPITAL_COMMUNITY): Payer: Self-pay | Admitting: Internal Medicine

## 2023-06-02 DIAGNOSIS — F419 Anxiety disorder, unspecified: Secondary | ICD-10-CM | POA: Diagnosis not present

## 2023-06-02 DIAGNOSIS — I10 Essential (primary) hypertension: Secondary | ICD-10-CM | POA: Diagnosis not present

## 2023-06-02 DIAGNOSIS — G894 Chronic pain syndrome: Secondary | ICD-10-CM | POA: Diagnosis not present

## 2023-06-02 DIAGNOSIS — Z1231 Encounter for screening mammogram for malignant neoplasm of breast: Secondary | ICD-10-CM

## 2023-06-02 DIAGNOSIS — Z6826 Body mass index (BMI) 26.0-26.9, adult: Secondary | ICD-10-CM | POA: Diagnosis not present

## 2023-06-02 DIAGNOSIS — E1165 Type 2 diabetes mellitus with hyperglycemia: Secondary | ICD-10-CM | POA: Diagnosis not present

## 2023-06-02 DIAGNOSIS — E1140 Type 2 diabetes mellitus with diabetic neuropathy, unspecified: Secondary | ICD-10-CM | POA: Diagnosis not present

## 2023-06-02 DIAGNOSIS — E663 Overweight: Secondary | ICD-10-CM | POA: Diagnosis not present

## 2023-06-02 DIAGNOSIS — C3490 Malignant neoplasm of unspecified part of unspecified bronchus or lung: Secondary | ICD-10-CM | POA: Diagnosis not present

## 2023-06-02 DIAGNOSIS — J449 Chronic obstructive pulmonary disease, unspecified: Secondary | ICD-10-CM | POA: Diagnosis not present

## 2023-06-06 ENCOUNTER — Ambulatory Visit (HOSPITAL_COMMUNITY)
Admission: RE | Admit: 2023-06-06 | Discharge: 2023-06-06 | Disposition: A | Discharge status: Home / Self Care | Payer: Medicare HMO | Source: Ambulatory Visit | Attending: Internal Medicine | Admitting: Internal Medicine

## 2023-06-06 DIAGNOSIS — Z1231 Encounter for screening mammogram for malignant neoplasm of breast: Secondary | ICD-10-CM | POA: Insufficient documentation

## 2023-06-10 DIAGNOSIS — H40013 Open angle with borderline findings, low risk, bilateral: Secondary | ICD-10-CM | POA: Diagnosis not present

## 2023-06-10 DIAGNOSIS — H5213 Myopia, bilateral: Secondary | ICD-10-CM | POA: Diagnosis not present

## 2023-06-10 LAB — HM DIABETES EYE EXAM

## 2023-06-15 ENCOUNTER — Encounter: Payer: Self-pay | Admitting: Internal Medicine

## 2023-06-15 ENCOUNTER — Ambulatory Visit: Payer: Medicare HMO | Admitting: Internal Medicine

## 2023-06-15 VITALS — BP 115/60 | HR 81 | Ht 65.0 in | Wt 162.0 lb

## 2023-06-15 DIAGNOSIS — Z794 Long term (current) use of insulin: Secondary | ICD-10-CM | POA: Diagnosis not present

## 2023-06-15 DIAGNOSIS — E039 Hypothyroidism, unspecified: Secondary | ICD-10-CM

## 2023-06-15 DIAGNOSIS — E1165 Type 2 diabetes mellitus with hyperglycemia: Secondary | ICD-10-CM

## 2023-06-15 DIAGNOSIS — Z7984 Long term (current) use of oral hypoglycemic drugs: Secondary | ICD-10-CM | POA: Diagnosis not present

## 2023-06-15 LAB — BASIC METABOLIC PANEL
BUN: 13 mg/dL (ref 6–23)
CO2: 28 meq/L (ref 19–32)
Calcium: 9.7 mg/dL (ref 8.4–10.5)
Chloride: 103 meq/L (ref 96–112)
Creatinine, Ser: 0.64 mg/dL (ref 0.40–1.20)
GFR: 97.48 mL/min (ref >60.00)
Glucose, Bld: 149 mg/dL — ABNORMAL HIGH (ref 70–99)
Potassium: 4.5 meq/L (ref 3.5–5.1)
Sodium: 139 meq/L (ref 135–145)

## 2023-06-15 LAB — POCT GLYCOSYLATED HEMOGLOBIN (HGB A1C): Hemoglobin A1C: 8.9 % — AB (ref 4.0–5.6)

## 2023-06-15 LAB — MICROALBUMIN / CREATININE URINE RATIO
Creatinine,U: 23.6 mg/dL
Microalb Creat Ratio: 3 mg/g (ref 0.0–30.0)
Microalb, Ur: <0.7 mg/dL (ref 0.0–1.9)

## 2023-06-15 MED ORDER — TIRZEPATIDE 2.5 MG/0.5ML ~~LOC~~ SOAJ: 2.5000 mg | SUBCUTANEOUS | 2 refills | Status: DC

## 2023-06-15 NOTE — Patient Instructions (Signed)
-   Start Mounjaro 2.5 mg once weekly  - Continue Repaglinide 0.5 mg, 1 tablet BEFORE supper  - Continue Metformin ONE tablet with Breakfast and ONE tablet with Supper - Continue Jardiance 25 mg with breakfast   - Continue  Tresiba 22 units once daily   -HOW TO TREAT LOW BLOOD SUGARS (Blood sugar LESS THAN 70 MG/DL) Please follow the RULE OF 15 for the treatment of hypoglycemia treatment (when your (blood sugars are less than 70 mg/dL)   STEP 1: Take 15 grams of carbohydrates when your blood sugar is low, which includes:  3-4 GLUCOSE TABS  OR 3-4 OZ OF JUICE OR REGULAR SODA OR ONE TUBE OF GLUCOSE GEL    STEP 2: RECHECK blood sugar in 15 MINUTES STEP 3: If your blood sugar is still low at the 15 minute recheck --> then, go back to STEP 1 and treat AGAIN with another 15 grams of carbohydrates.

## 2023-06-15 NOTE — Progress Notes (Unsigned)
Name: Marissa [name]  Age/ Sex: 57 y.o., female   MRN/ DOB: [id number], Jan 10, 1966     PCP: Elfredia Nevins, MD   Reason for Endocrinology Evaluation: Type 2 Diabetes Mellitus  Initial Endocrine Consultative Visit: 06/04/2019    PATIENT IDENTIFIER: Marissa [name] is a 57 y.o. female with a past medical history of HTN,T2DM, Dyslipidemia and Lung Ca (2006) . The patient has followed with Endocrinology clinic since 06/03/2001 for consultative assistance with management of her diabetes.  DIABETIC HISTORY:  Marissa [name] was diagnosed with T2DM in 2006. She has been on oral glycemic agents in the past ( Glipizide, glimepiride, metformin, Farxiga ( not covered) , insulin added in 01/2019. Her hemoglobin A1c has ranged from 6.7% in 2015, peaking at 13.8% in 02/2019.   On her initial visit to our clinic her A1c was 10.8%  , she was on Glimepiride, Metformin and Levemir. We stopped Levemir, and Glimepiride , started insulin Mix and reduced metformin dose due to nausea and diarrhea.  Jardiance started 10/2019 Stopped insulin mix and started basal insulin 06/2021 Started trulicity 06/2021 but this was stopped due to nausea and vomiting  Repaglinide started 11/2022 with an A1c of 9.0%  HYPOKALEMIA: She has had intermittent spontaneous hypokalemia since 2020.  Her  aldosterone came back normal at 4.3 NG/DL with elevated running at 13.8 02/2021 She had an abnormal 24-hour urinary cortisol 07/15/2021 but this was attributed to improper collection.  Dexamethasone suppression test was normal at 1.5 ug/dL 27/2536   SUBJECTIVE:   During the last visit (12/08/2022): A1c 9.0%   Today (06/15/23): Marissa [name] is here for a follow up on diabetes management. She checks her blood sugars  multiple times daily, through CGM. The patient has had hypoglycemic episodes since the last clinic visit.  These have been occurring during the day with activities, she is in process of moving to a new place.   She is S/P right  upper lobe wedge resection for adenocarcinoma 02/2021 for Stage Ia adenocarcinoma with an 8 mm invasive component, negative L.N    Denies nausea or vomiting  Has occasional diarrhea   HOME DIABETES REGIMEN:  Metformin 500 mg ,1 tablet BID  Repaglinide 0.5 mg, 1 tablet before supper Jardiance 25 mg daily  Tresiba 22 units daily    CONTINUOUS GLUCOSE MONITORING RECORD INTERPRETATION    Dates of Recording: 8/22-06/15/2023  Sensor description: Dexcom  Results statistics:   CGM use % of time 86  Average and SD 171/42  Time in range   68%  % Time Above 180 26  % Time above 250 6  % Time Below target 0     Glycemic patterns summary: Her BG's trend down overnight and fluctuate during the day Hyperglycemic episodes post-prandial   Hypoglycemic episodes occurred N/A  Overnight periods: trends down      DIABETIC COMPLICATIONS: Microvascular complications:  Neuropathy , glaucoma  Denies: CKD , retinopathy  Last eye exam: Completed 06/10/2023   Macrovascular complications:  CAD ( S/P PCI )  Denies:  PVD, CVA   HISTORY:  Past Medical History:  Past Medical History:  Diagnosis Date   Adenocarcinoma of lung (HCC) 02/06/2014   Anxiety    Arthritis    COPD (chronic obstructive pulmonary disease) (HCC)    Coronary artery disease    Diabetes mellitus    GERD (gastroesophageal reflux disease)    Hypertension    Hypothyroidism    Lung cancer (HCC)    Past Surgical History:  Past  Surgical History:  Procedure Laterality Date   APPENDECTOMY     CHOLECYSTECTOMY     INTERCOSTAL NERVE BLOCK Right 02/19/2021   Procedure: INTERCOSTAL NERVE BLOCK;  Surgeon: Loreli Slot, MD;  Location: [facility] OR;  Service: Thoracic;  Laterality: Right;   LOBECTOMY Left 2006   NM MYOCAR IMG MI  05/2008   lexiscan -perfusion defect in anterior myocardium (breast attenuation); remaining myocardium with NO evidence of ischemia or infarct; EF 78%; low risk scan   NODE DISSECTION Right  02/19/2021   Procedure: NODE DISSECTION;  Surgeon: Loreli Slot, MD;  Location: [facility] OR;  Service: Thoracic;  Laterality: Right;   OOPHORECTOMY Left    PARTIAL HYSTERECTOMY  1985   SALPINGOOPHORECTOMY Left    benign 9lb mass removed and a massive wall of smaller tumors removed   THORACIC AORTIC ENDOVASCULAR STENT GRAFT N/A 06/04/2019   Procedure: THORACIC AORTIC ENDOVASCULAR STENT GRAFT;  Surgeon: Chuck Hint, MD;  Location: [facility] OR;  Service: Vascular;  Laterality: N/A;   TRANSTHORACIC ECHOCARDIOGRAM  05/2008   EF =/>55%, mild MR; trace TR   VIDEO BRONCHOSCOPY WITH ENDOBRONCHIAL NAVIGATION N/A 02/19/2021   Procedure: VIDEO BRONCHOSCOPY WITH ENDOBRONCHIAL NAVIGATION;  Surgeon: Loreli Slot, MD;  Location: [facility] OR;  Service: Thoracic;  Laterality: N/A;   Social History:  reports that she has been smoking cigarettes. She started smoking about 34 years ago. She has a 16 pack-year smoking history. She has never used smokeless tobacco. She reports that she does not drink alcohol and does not use drugs. Family History:  Family History  Problem Relation Age of Onset   Colon cancer Mother    Lung cancer Mother    Heart disease Mother    Heart disease Father    Ovarian cancer Sister    Hepatitis Brother    Hernia Brother      HOME MEDICATIONS: Allergies as of 06/15/2023       Reactions   Bee Venom Anaphylaxis   Penicillins Swelling, Rash, Other (See Comments)   Did it involve swelling of the face/tongue/throat, SOB, or low BP? Yes-whole body (swelling) Did it involve sudden or severe rash/hives, skin peeling, or any reaction on the inside of your mouth or nose? Yes-Hives Did you need to seek medical attention at a hospital or doctor's office? Yes When did it last happen?  Childhood     If all above answers are "NO", may proceed with cephalosporin use.   Poison Oak Extract Anaphylaxis   Trulicity [dulaglutide] Nausea And Vomiting        Medication List         Accurate as of June 15, 2023 11:46 AM. If you have any questions, ask your nurse or doctor.          acetaminophen 500 MG tablet Commonly known as: TYLENOL Take 2 tablets (1,000 mg total) by mouth every 6 (six) hours.   albuterol (2.5 MG/3ML) 0.083% nebulizer solution Commonly known as: PROVENTIL Take 2.5 mg by nebulization every 6 (six) hours as needed for shortness of breath or wheezing.   albuterol-ipratropium 18-103 MCG/ACT inhaler Commonly known as: COMBIVENT Inhale 2 puffs into the lungs every 6 (six) hours as needed for wheezing or shortness of breath.   ALPRAZolam 0.5 MG tablet Commonly known as: XANAX Take 0.5 mg by mouth at bedtime.   aspirin EC 81 MG tablet Take 81 mg by mouth daily.   atorvastatin 80 MG tablet Commonly known as: LIPITOR Take 1 tablet (80 mg total) by  mouth daily at 6 PM.   BD Pen Needle Nano 2nd Gen 32G X 4 MM Misc Generic drug: Insulin Pen Needle USE TO INJECT INSULIN TWICE DAILY.   Dexcom G7 Sensor Misc 1 Device by Does not apply route as directed.   Eliquis 5 MG Tabs tablet Generic drug: apixaban TAKE 1 TABLET(5 MG) BY MOUTH TWICE DAILY   empagliflozin 25 MG Tabs tablet Commonly known as: Jardiance Take 1 tablet (25 mg total) by mouth daily before breakfast.   esomeprazole 40 MG capsule Commonly known as: NEXIUM Take 1 capsule (40 mg total) by mouth daily before breakfast.   ezetimibe 10 MG tablet Commonly known as: ZETIA Take 10 mg by mouth daily.   latanoprost 0.005 % ophthalmic solution Commonly known as: XALATAN Place 1 drop into both eyes at bedtime.   levothyroxine 50 MCG tablet Commonly known as: SYNTHROID Take 1 tablet (50 mcg total) by mouth daily.   lisinopril 10 MG tablet Commonly known as: ZESTRIL Take 10 mg by mouth daily.   metFORMIN 500 MG tablet Commonly known as: Glucophage Take 1 tablet (500 mg total) by mouth 2 (two) times daily with a meal.   OneTouch Ultra test strip Generic drug: glucose  blood 1 each by Other route in the morning, at noon, in the evening, and at bedtime. Check blood sugar 3 times daily   oxyCODONE 15 MG immediate release tablet Commonly known as: ROXICODONE Take 15 mg by mouth every 6 (six) hours as needed for pain.   repaglinide 0.5 MG tablet Commonly known as: PRANDIN Take 1 tablet (0.5 mg total) by mouth daily before supper.   Evaristo Bury FlexTouch 100 UNIT/ML FlexTouch Pen Generic drug: insulin degludec Inject 22 Units into the skin daily.   zolpidem 10 MG tablet Commonly known as: AMBIEN Take 10 mg by mouth at bedtime.         OBJECTIVE:   Vital Signs: BP 115/60   Pulse 81   Ht 5\' 5"  (1.651 m)   Wt 162 lb (73.5 kg)   SpO2 97%   BMI 26.96 kg/m   Wt Readings from Last 3 Encounters:  06/15/23 162 lb (73.5 kg)  12/08/22 162 lb (73.5 kg)  06/02/22 157 lb 9.6 oz (71.5 kg)     Exam: General: Pt appears well and is in NAD  Lungs: Clear with good BS bilat with no rales, rhonchi, or wheezes  Heart: RRR with normal  Abdomen: Normoactive bowel sounds, soft, nontender  Extremities: No pretibial edema.   Neuro: MS is good with appropriate affect, pt is alert and Ox3       DM foot exam: 06/15/2023   The skin of the feet is intact without sores or ulcerations. The pedal pulses are 2+ on right and 2+ on left. The sensation is intact to a screening 5.07, 10 gram monofilament bilaterally    DATA REVIEWED:  Lab Results  Component Value Date   HGBA1C 9.0 (A) 12/08/2022   HGBA1C 7.6 (A) 06/02/2022   HGBA1C 9.4 (A) 01/29/2022   02/22/2022 BUN 15 Cr 0.650 GFR 103 LDL 62 TSH 3.920 ASSESSMENT / PLAN / RECOMMENDATIONS:   1) Type 2 Diabetes Mellitus,Poorly controlled , With Neuropathic and Macrovascular  complications - Most recent A1c of 8.9 %. Goal A1c < 7.0 %.   -Unfortunately she continues with worsening glycemic control , and reviewing CGM data, the patient has been noted with the highest glucose rise after midnight, patient states  she is hungry at night between the hours  of 11 PM-1 AM and she would eat a meal , but she takes the repaglinide in the evening around 5 PM -I have recommended switching her insulin from nighttime to morning time, as it appears that she attributes to nighttime hunger to insulin intake at night -Intolerant to higher doses of metformin - Intolerant to [employer]  -We have discussed options of adding Mounjaro, caution against GI side effects at it is very close to Trulicity, versus increasing repaglinide to be taken with each meal including the meal that she is after midnight -We have opted in trying [facility]  MEDICATIONS: Start Mounjaro 2.5 mg weekly continue repaglinide 0.5 mg, 1 tablet before supper Continue Metformin 500 mg BID Continue Jardiance 25 mg daily  Continue Tresiba 22 units daily    EDUCATION / INSTRUCTIONS: BG monitoring instructions: Patient is instructed to check her blood sugars 2 times a day, breakfast and supper. Call [facility] if: BG persistently < 70  I reviewed the Rule of 15 for the treatment of hypoglycemia in detail with the patient. Literature supplied.    2) Hypothyroidism:  -Patient is on long-term levothyroxine, her most recent TSH was normal as of May 2024 -She is having eye issues and was evaluated by 3 different eye specialist, her last exam was at Dr. Ovidio Kin office and someone suggested a checkup for thyroid eye disease -She has no history of Graves' disease, but will proceed with TRAb checkup   F/U in 6 months    Signed electronically by: Lyndle Herrlich, MD  [facility]  [facility] [address] [place] [place], [place] [address] Phone: [phone] FAX: [phone]   CC: Elfredia Nevins, MD [address] [place] [address] Phone: [phone]  Fax: [phone]  Return to Endocrinology clinic as below: No future appointments.

## 2023-06-16 ENCOUNTER — Encounter: Payer: Self-pay | Admitting: Internal Medicine

## 2023-06-17 ENCOUNTER — Telehealth: Payer: Self-pay

## 2023-06-17 LAB — TRAB (TSH RECEPTOR BINDING ANTIBODY): TRAB: 3.42 IU/L — ABNORMAL HIGH (ref <=2.00)

## 2023-06-17 NOTE — Telephone Encounter (Signed)
Mounjaro needs a PA. 

## 2023-06-20 ENCOUNTER — Other Ambulatory Visit (HOSPITAL_COMMUNITY): Payer: Self-pay

## 2023-06-20 ENCOUNTER — Telehealth: Payer: Self-pay | Admitting: Internal Medicine

## 2023-06-20 ENCOUNTER — Telehealth: Payer: Self-pay

## 2023-06-20 MED ORDER — REPAGLINIDE 0.5 MG PO TABS: 0.5000 mg | ORAL_TABLET | Freq: Every day | ORAL | 2 refills | Status: DC

## 2023-06-20 MED ORDER — METFORMIN HCL 500 MG PO TABS: 500.0000 mg | ORAL_TABLET | Freq: Two times a day (BID) | ORAL | 3 refills | Status: DC

## 2023-06-20 NOTE — Telephone Encounter (Signed)
Pharmacy Patient Advocate Encounter   Received notification from Pt Calls Messages that prior authorization for Marissa [name] is required/requested.   Insurance verification completed.   The patient is insured through [employer] .   Per test claim: The current 28 day co-pay is, $11.20.  No PA needed at this time. This test claim was processed through [facility]- copay amounts may vary at other pharmacies due to pharmacy/plan contracts, or as the patient moves through the different stages of their insurance plan.

## 2023-06-20 NOTE — Telephone Encounter (Signed)
Spoke to the patient on 06/20/2023  Discussed elevated TRAb    Latest Reference Range & Units 06/15/23 12:17  TRAB <=2.00 IU/L 3.42 (H)  (H): Data is abnormally high  Will fax these results to Dr. Dellia Nims office as they had a question about Graves' orbitopathy   No intervention from endocrinology standpoint at this time

## 2023-06-20 NOTE — Telephone Encounter (Signed)
Lab report has been faxed to Dr. Emily Filbert office

## 2023-07-01 DIAGNOSIS — F419 Anxiety disorder, unspecified: Secondary | ICD-10-CM | POA: Diagnosis not present

## 2023-07-01 DIAGNOSIS — E1165 Type 2 diabetes mellitus with hyperglycemia: Secondary | ICD-10-CM | POA: Diagnosis not present

## 2023-07-01 DIAGNOSIS — I10 Essential (primary) hypertension: Secondary | ICD-10-CM | POA: Diagnosis not present

## 2023-07-01 DIAGNOSIS — J449 Chronic obstructive pulmonary disease, unspecified: Secondary | ICD-10-CM | POA: Diagnosis not present

## 2023-07-01 DIAGNOSIS — E1140 Type 2 diabetes mellitus with diabetic neuropathy, unspecified: Secondary | ICD-10-CM | POA: Diagnosis not present

## 2023-07-01 DIAGNOSIS — G894 Chronic pain syndrome: Secondary | ICD-10-CM | POA: Diagnosis not present

## 2023-07-19 ENCOUNTER — Other Ambulatory Visit (HOSPITAL_COMMUNITY): Payer: Self-pay

## 2023-07-21 DIAGNOSIS — L732 Hidradenitis suppurativa: Secondary | ICD-10-CM | POA: Diagnosis not present

## 2023-08-08 DIAGNOSIS — E1165 Type 2 diabetes mellitus with hyperglycemia: Secondary | ICD-10-CM | POA: Diagnosis not present

## 2023-08-08 DIAGNOSIS — I10 Essential (primary) hypertension: Secondary | ICD-10-CM | POA: Diagnosis not present

## 2023-08-08 DIAGNOSIS — J449 Chronic obstructive pulmonary disease, unspecified: Secondary | ICD-10-CM | POA: Diagnosis not present

## 2023-08-08 DIAGNOSIS — E1169 Type 2 diabetes mellitus with other specified complication: Secondary | ICD-10-CM | POA: Diagnosis not present

## 2023-08-08 DIAGNOSIS — E1140 Type 2 diabetes mellitus with diabetic neuropathy, unspecified: Secondary | ICD-10-CM | POA: Diagnosis not present

## 2023-08-08 DIAGNOSIS — C3490 Malignant neoplasm of unspecified part of unspecified bronchus or lung: Secondary | ICD-10-CM | POA: Diagnosis not present

## 2023-08-08 DIAGNOSIS — G894 Chronic pain syndrome: Secondary | ICD-10-CM | POA: Diagnosis not present

## 2023-08-26 DIAGNOSIS — E1165 Type 2 diabetes mellitus with hyperglycemia: Secondary | ICD-10-CM | POA: Diagnosis not present

## 2023-09-12 ENCOUNTER — Telehealth: Payer: Self-pay

## 2023-09-12 NOTE — Telephone Encounter (Signed)
Patient is not comfortable with patient assistance as she doesn't won't to disclose her personal information. She states that she wouldn't be doing that.

## 2023-09-12 NOTE — Telephone Encounter (Signed)
Patient has hit the max Qty allowed by insurance on the [facility] f and want be able to receive again until 06/2024. Would like to know what she needs to do since she won't be able to take injection.

## 2023-09-12 NOTE — Telephone Encounter (Signed)
Patient aware.

## 2023-09-25 DIAGNOSIS — E1165 Type 2 diabetes mellitus with hyperglycemia: Secondary | ICD-10-CM | POA: Diagnosis not present

## 2023-10-26 DIAGNOSIS — E1165 Type 2 diabetes mellitus with hyperglycemia: Secondary | ICD-10-CM | POA: Diagnosis not present

## 2023-11-01 ENCOUNTER — Encounter: Payer: Self-pay | Admitting: Internal Medicine

## 2023-11-01 ENCOUNTER — Ambulatory Visit: Payer: Medicare HMO | Admitting: Internal Medicine

## 2023-11-01 VITALS — BP 122/60 | HR 101 | Resp 20 | Ht 65.0 in | Wt 162.4 lb

## 2023-11-01 DIAGNOSIS — Z794 Long term (current) use of insulin: Secondary | ICD-10-CM | POA: Diagnosis not present

## 2023-11-01 DIAGNOSIS — E1159 Type 2 diabetes mellitus with other circulatory complications: Secondary | ICD-10-CM | POA: Diagnosis not present

## 2023-11-01 DIAGNOSIS — E039 Hypothyroidism, unspecified: Secondary | ICD-10-CM | POA: Insufficient documentation

## 2023-11-01 DIAGNOSIS — E1142 Type 2 diabetes mellitus with diabetic polyneuropathy: Secondary | ICD-10-CM

## 2023-11-01 DIAGNOSIS — E1165 Type 2 diabetes mellitus with hyperglycemia: Secondary | ICD-10-CM | POA: Diagnosis not present

## 2023-11-01 DIAGNOSIS — E0500 Thyrotoxicosis with diffuse goiter without thyrotoxic crisis or storm: Secondary | ICD-10-CM | POA: Diagnosis not present

## 2023-11-01 LAB — POCT GLYCOSYLATED HEMOGLOBIN (HGB A1C): Hemoglobin A1C: 7.3 % — AB (ref 4.0–5.6)

## 2023-11-01 MED ORDER — METFORMIN HCL ER 500 MG PO TB24: 500.0000 mg | ORAL_TABLET | Freq: Every day | ORAL | 3 refills | Status: AC

## 2023-11-01 MED ORDER — EMPAGLIFLOZIN 25 MG PO TABS: 25.0000 mg | ORAL_TABLET | Freq: Every day | ORAL | 3 refills | Status: DC

## 2023-11-01 MED ORDER — REPAGLINIDE 0.5 MG PO TABS: 0.5000 mg | ORAL_TABLET | Freq: Every day | ORAL | 3 refills | Status: DC

## 2023-11-01 MED ORDER — BD PEN NEEDLE NANO 2ND GEN 32G X 4 MM MISC: 1.0000 | Freq: Every day | 3 refills | Status: AC

## 2023-11-01 MED ORDER — TIRZEPATIDE 5 MG/0.5ML ~~LOC~~ SOAJ: 5.0000 mg | SUBCUTANEOUS | 3 refills | Status: DC

## 2023-11-01 MED ORDER — TRESIBA FLEXTOUCH 100 UNIT/ML ~~LOC~~ SOPN: 20.0000 [IU] | PEN_INJECTOR | Freq: Every day | SUBCUTANEOUS | 3 refills | Status: AC

## 2023-11-01 NOTE — Patient Instructions (Addendum)
-   Restart Mounjaro 2.5 mg once weekly for 4 weeks, than increase to 5 mg weekly  - Continue Repaglinide 0.5 mg, 1 tablet BEFORE supper  - Continue Metformin ONE tablet with Breakfast and ONE tablet with Supper - Continue Jardiance 25 mg with breakfast   - Decrease  Tresiba 20 units once daily   -HOW TO TREAT LOW BLOOD SUGARS (Blood sugar LESS THAN 70 MG/DL) Please follow the RULE OF 15 for the treatment of hypoglycemia treatment (when your (blood sugars are less than 70 mg/dL)   STEP 1: Take 15 grams of carbohydrates when your blood sugar is low, which includes:  3-4 GLUCOSE TABS  OR 3-4 OZ OF JUICE OR REGULAR SODA OR ONE TUBE OF GLUCOSE GEL    STEP 2: RECHECK blood sugar in 15 MINUTES STEP 3: If your blood sugar is still low at the 15 minute recheck --> then, go back to STEP 1 and treat AGAIN with another 15 grams of carbohydrates.

## 2023-11-01 NOTE — Progress Notes (Signed)
Name: Marissa [name]  Age/ Sex: 58 y.o., female   MRN/ DOB: [id number], 1966/08/07     PCP: Elfredia Nevins, MD   Reason for Endocrinology Evaluation: Type 2 Diabetes Mellitus  Initial Endocrine Consultative Visit: 06/04/2019    PATIENT IDENTIFIER: Marissa [name] is a 58 y.o. female with a past medical history of HTN,T2DM, Dyslipidemia and Lung Ca (2006) . The patient has followed with Endocrinology clinic since 06/03/2001 for consultative assistance with management of her diabetes.  DIABETIC HISTORY:  Marissa [name] was diagnosed with T2DM in 2006. She has been on oral glycemic agents in the past ( Glipizide, glimepiride, metformin, Farxiga ( not covered) , insulin added in 01/2019. Her hemoglobin A1c has ranged from 6.7% in 2015, peaking at 13.8% in 02/2019.   On her initial visit to our clinic her A1c was 10.8%  , she was on Glimepiride, Metformin and Levemir. We stopped Levemir, and Glimepiride , started insulin Mix and reduced metformin dose due to nausea and diarrhea.  Jardiance started 10/2019 Stopped insulin mix and started basal insulin 06/2021 Started trulicity 06/2021 but this was stopped due to nausea and vomiting  Repaglinide started 11/2022 with an A1c of 9.0%   Start Mounjaro 06/2023 with an A1c of 8.9%     HYPOKALEMIA: She has had intermittent spontaneous hypokalemia since 2020.  Her  aldosterone came back normal at 4.3 NG/DL with elevated running at 13.8 02/2021 She had an abnormal 24-hour urinary cortisol 07/15/2021 but this was attributed to improper collection.  Dexamethasone suppression test was normal at 1.5 ug/dL 40/9811   SUBJECTIVE:   During the last visit (06/15/2023): A1c 8.9%   Today (11/01/23): Marissa [name] is here for a follow up on diabetes management. She checks her blood sugars  multiple times daily, through CGM. The patient has had hypoglycemic episodes since the last clinic visit.  These have been occurring during the day with activities, she is in  process of moving to a new place.   She is S/P right upper lobe wedge resection for adenocarcinoma 02/2021 for Stage Ia adenocarcinoma with an 8 mm invasive component, negative L.N    Denies nausea or vomiting  Has chronic alternating constipation with diarrhea    HOME DIABETES REGIMEN:  Metformin 500 mg ,1 tablet BID  Repaglinide 0.5 mg, 1 tablet before supper Jardiance 25 mg daily  Tresiba 22 units daily Mounjaro 2.5 mg weekly    CONTINUOUS GLUCOSE MONITORING RECORD INTERPRETATION    Dates of Recording: 1/8-1/21/2025  Sensor description: Dexcom  Results statistics:   CGM use % of time 87  Average and SD 147/38  Time in range 81%  % Time Above 180 17  % Time above 250 2  % Time Below target 0     Glycemic patterns summary: BGs are optimal overnight and most of the day  Hypoglycemic episodes occurred N/A  Overnight periods: trends down to optimal      DIABETIC COMPLICATIONS: Microvascular complications:  Neuropathy , glaucoma  Denies: CKD , retinopathy  Last eye exam: Completed 06/10/2023   Macrovascular complications:  CAD ( S/P PCI )  Denies:  PVD, CVA   HISTORY:  Past Medical History:  Past Medical History:  Diagnosis Date   Adenocarcinoma of lung (HCC) 02/06/2014   Anxiety    Arthritis    COPD (chronic obstructive pulmonary disease) (HCC)    Coronary artery disease    Diabetes mellitus    GERD (gastroesophageal reflux disease)    Hypertension  Hypothyroidism    Lung cancer [facility])    Past Surgical History:  Past Surgical History:  Procedure Laterality Date   APPENDECTOMY     CHOLECYSTECTOMY     INTERCOSTAL NERVE BLOCK Right 02/19/2021   Procedure: INTERCOSTAL NERVE BLOCK;  Surgeon: Loreli Slot, MD;  Location: [facility] OR;  Service: Thoracic;  Laterality: Right;   LOBECTOMY Left 2006   NM MYOCAR IMG MI  05/2008   lexiscan -perfusion defect in anterior myocardium (breast attenuation); remaining myocardium with NO evidence of ischemia  or infarct; EF 78%; low risk scan   NODE DISSECTION Right 02/19/2021   Procedure: NODE DISSECTION;  Surgeon: Loreli Slot, MD;  Location: [facility] OR;  Service: Thoracic;  Laterality: Right;   OOPHORECTOMY Left    PARTIAL HYSTERECTOMY  1985   SALPINGOOPHORECTOMY Left    benign 9lb mass removed and a massive wall of smaller tumors removed   THORACIC AORTIC ENDOVASCULAR STENT GRAFT N/A 06/04/2019   Procedure: THORACIC AORTIC ENDOVASCULAR STENT GRAFT;  Surgeon: Chuck Hint, MD;  Location: [facility] OR;  Service: Vascular;  Laterality: N/A;   TRANSTHORACIC ECHOCARDIOGRAM  05/2008   EF =/>55%, mild MR; trace TR   VIDEO BRONCHOSCOPY WITH ENDOBRONCHIAL NAVIGATION N/A 02/19/2021   Procedure: VIDEO BRONCHOSCOPY WITH ENDOBRONCHIAL NAVIGATION;  Surgeon: Loreli Slot, MD;  Location: [facility] OR;  Service: Thoracic;  Laterality: N/A;   Social History:  reports that she has been smoking cigarettes. She started smoking about 34 years ago. She has a 16 pack-year smoking history. She has never used smokeless tobacco. She reports that she does not drink alcohol and does not use drugs. Family History:  Family History  Problem Relation Age of Onset   Colon cancer Mother    Lung cancer Mother    Heart disease Mother    Heart disease Father    Ovarian cancer Sister    Hepatitis Brother    Hernia Brother      HOME MEDICATIONS: Allergies as of 11/01/2023       Reactions   Bee Venom Anaphylaxis   Penicillins Swelling, Rash, Other (See Comments)   Did it involve swelling of the face/tongue/throat, SOB, or low BP? Yes-whole body (swelling) Did it involve sudden or severe rash/hives, skin peeling, or any reaction on the inside of your mouth or nose? Yes-Hives Did you need to seek medical attention at a hospital or doctor's office? Yes When did it last happen?  Childhood     If all above answers are "NO", may proceed with cephalosporin use.   Poison Oak Extract Anaphylaxis   Trulicity [dulaglutide]  Nausea And Vomiting        Medication List        Accurate as of November 01, 2023  1:37 PM. If you have any questions, ask your nurse or doctor.          acetaminophen 500 MG tablet Commonly known as: TYLENOL Take 2 tablets (1,000 mg total) by mouth every 6 (six) hours.   albuterol (2.5 MG/3ML) 0.083% nebulizer solution Commonly known as: PROVENTIL Take 2.5 mg by nebulization every 6 (six) hours as needed for shortness of breath or wheezing.   albuterol-ipratropium 18-103 MCG/ACT inhaler Commonly known as: COMBIVENT Inhale 2 puffs into the lungs every 6 (six) hours as needed for wheezing or shortness of breath.   ALPRAZolam 0.5 MG tablet Commonly known as: XANAX Take 0.5 mg by mouth at bedtime.   aspirin EC 81 MG tablet Take 81 mg by mouth daily.  atorvastatin 80 MG tablet Commonly known as: LIPITOR Take 1 tablet (80 mg total) by mouth daily at 6 PM.   BD Pen Needle Nano 2nd Gen 32G X 4 MM Misc Generic drug: Insulin Pen Needle USE TO INJECT INSULIN TWICE DAILY.   Dexcom G7 Sensor Misc 1 Device by Does not apply route as directed.   Eliquis 5 MG Tabs tablet Generic drug: apixaban TAKE 1 TABLET(5 MG) BY MOUTH TWICE DAILY   empagliflozin 25 MG Tabs tablet Commonly known as: Jardiance Take 1 tablet (25 mg total) by mouth daily before breakfast.   esomeprazole 40 MG capsule Commonly known as: NEXIUM Take 1 capsule (40 mg total) by mouth daily before breakfast.   ezetimibe 10 MG tablet Commonly known as: ZETIA Take 10 mg by mouth daily.   latanoprost 0.005 % ophthalmic solution Commonly known as: XALATAN Place 1 drop into both eyes at bedtime.   levothyroxine 50 MCG tablet Commonly known as: SYNTHROID Take 1 tablet (50 mcg total) by mouth daily.   lisinopril 10 MG tablet Commonly known as: ZESTRIL Take 10 mg by mouth daily.   metFORMIN 500 MG tablet Commonly known as: Glucophage Take 1 tablet (500 mg total) by mouth 2 (two) times daily with a  meal.   OneTouch Ultra test strip Generic drug: glucose blood 1 each by Other route in the morning, at noon, in the evening, and at bedtime. Check blood sugar 3 times daily   oxyCODONE 15 MG immediate release tablet Commonly known as: ROXICODONE Take 15 mg by mouth every 6 (six) hours as needed for pain.   repaglinide 0.5 MG tablet Commonly known as: PRANDIN Take 1 tablet (0.5 mg total) by mouth daily before supper.   tirzepatide 2.5 MG/0.5ML Pen Commonly known as: MOUNJARO Inject 2.5 mg into the skin once a week.   Evaristo Bury FlexTouch 100 UNIT/ML FlexTouch Pen Generic drug: insulin degludec Inject 22 Units into the skin daily.   zolpidem 10 MG tablet Commonly known as: AMBIEN Take 10 mg by mouth at bedtime.         OBJECTIVE:   Vital Signs: BP 122/60 (BP Location: Left Arm, Patient Position: Sitting, Cuff Size: Large)   Pulse (!) 101   Resp 20   Ht 5\' 5"  (1.651 m)   Wt 162 lb 6.4 oz (73.7 kg)   SpO2 98%   BMI 27.02 kg/m   Wt Readings from Last 3 Encounters:  11/01/23 162 lb 6.4 oz (73.7 kg)  06/15/23 162 lb (73.5 kg)  12/08/22 162 lb (73.5 kg)     Exam: General: Pt appears well and is in NAD  Lungs: Clear with good BS bilat   Heart: RRR with normal  Abdomen: soft, nontender  Extremities: No pretibial edema.   Neuro: MS is good with appropriate affect, pt is alert and Ox3       DM foot exam: 06/15/2023   The skin of the feet is intact without sores or ulcerations. The pedal pulses are 2+ on right and 2+ on left. The sensation is intact to a screening 5.07, 10 gram monofilament bilaterally    DATA REVIEWED:  Lab Results  Component Value Date   HGBA1C 8.9 (A) 06/15/2023   HGBA1C 9.0 (A) 12/08/2022   HGBA1C 7.6 (A) 06/02/2022     Latest Reference Range & Units 06/15/23 12:17  Sodium 135 - 145 mEq/L 139  Potassium 3.5 - 5.1 mEq/L 4.5  Chloride 96 - 112 mEq/L 103  CO2 19 - 32 mEq/L 28  Glucose 70 - 99 mg/dL 098 (H)  BUN 6 - 23 mg/dL 13   Creatinine 1.19 - 1.20 mg/dL 1.47  Calcium 8.4 - 82.9 mg/dL 9.7  GFR >56.21 mL/min 97.48    Latest Reference Range & Units 06/15/23 12:17  Creatinine,U mg/dL 30.8  Microalb, Ur 0.0 - 1.9 mg/dL <6.5  MICROALB/CREAT RATIO 0.0 - 30.0 mg/g 3.0      Latest Reference Range & Units 06/15/23 12:17  TRAB <=2.00 IU/L 3.42 (H)  (H): Data is abnormally high  ASSESSMENT / PLAN / RECOMMENDATIONS:   1) Type 2 Diabetes Mellitus, Optimally  controlled , With Neuropathic and Macrovascular  complications - Most recent A1c of 7.3 %. Goal A1c < 7.0 %.    -A1c has trended down from 8.9% to 7.3% -Intolerant to higher doses of metformin - Intolerant to [employer]  - She did well mounjaro 2.5 mg weekly but her insurance limits the 2.5 dose , today she was provided with number for samples of Mounjaro 2.5 mg pens, and new prescription of Mounjaro 5 mg weekly   MEDICATIONS: Restart Mounjaro 2.5 mg weekly for 4 weeks, than increase 5 mg weekly  continue repaglinide 0.5 mg, 1 tablet before supper Continue Metformin 500 mg XR, 1 tab BID Continue Jardiance 25 mg daily  Decrease Tresiba 20 units daily    EDUCATION / INSTRUCTIONS: BG monitoring instructions: Patient is instructed to check her blood sugars 2 times a day, breakfast and supper. Call [facility] if: BG persistently < 70  I reviewed the Rule of 15 for the treatment of hypoglycemia in detail with the patient. Literature supplied.    2) Hypothyroidism:  -Patient is on long-term levothyroxine, her most recent TSH was normal as of May 2024 -She is having eye issues and was evaluated by 3 different eye specialist, her last exam was at Dr. Ovidio Kin office and someone suggested a checkup for thyroid eye disease   3) Graves' disease:    -This was diagnosed based on elevated  TRAb 06/2023 -Interestingly enough she has been on LT-4 replacement therapy for many years, this was checked based on ophthalmology recommendations as the  patient was having eye symptoms  F/U in 4 months    Signed electronically by: Lyndle Herrlich, MD  [facility]  [facility] [address] [place], [place] [address] Phone: [phone] FAX: [phone]   CC: Elfredia Nevins, MD [address] [place] [address] Phone: [phone]  Fax: [phone]  Return to Endocrinology clinic as below: No future appointments.

## 2023-11-03 ENCOUNTER — Encounter: Payer: Self-pay | Admitting: Internal Medicine

## 2023-11-15 DIAGNOSIS — E039 Hypothyroidism, unspecified: Secondary | ICD-10-CM | POA: Diagnosis not present

## 2023-11-15 DIAGNOSIS — Z0001 Encounter for general adult medical examination with abnormal findings: Secondary | ICD-10-CM | POA: Diagnosis not present

## 2023-11-15 DIAGNOSIS — D518 Other vitamin B12 deficiency anemias: Secondary | ICD-10-CM | POA: Diagnosis not present

## 2023-11-15 DIAGNOSIS — E1140 Type 2 diabetes mellitus with diabetic neuropathy, unspecified: Secondary | ICD-10-CM | POA: Diagnosis not present

## 2023-11-15 DIAGNOSIS — E559 Vitamin D deficiency, unspecified: Secondary | ICD-10-CM | POA: Diagnosis not present

## 2023-11-15 DIAGNOSIS — G9332 Myalgic encephalomyelitis/chronic fatigue syndrome: Secondary | ICD-10-CM | POA: Diagnosis not present

## 2023-11-21 ENCOUNTER — Telehealth: Payer: Self-pay

## 2023-11-21 NOTE — Telephone Encounter (Signed)
 Unable to leave message. VM either full or unavailable. Will try again later.  Also sent detailed mychart message.

## 2023-11-21 NOTE — Telephone Encounter (Signed)
 Patient states that she has been experiencing constipation from the Mounjaro .

## 2023-11-24 ENCOUNTER — Telehealth: Payer: Self-pay | Admitting: Internal Medicine

## 2023-11-24 ENCOUNTER — Encounter: Payer: Self-pay | Admitting: Internal Medicine

## 2023-11-24 DIAGNOSIS — E1165 Type 2 diabetes mellitus with hyperglycemia: Secondary | ICD-10-CM | POA: Diagnosis not present

## 2023-11-24 NOTE — Telephone Encounter (Signed)
Patient was advised to take the 18 units of the [place] and she states that her sugar drop low when she takes the Repaglandine at night time. She just finished the 2.5mg  dose of Mounjaro and it worried that it will bottom out even more when she starts the 5mg .

## 2023-11-24 NOTE — Telephone Encounter (Signed)
Patient came in to office to say that she is "bottoming out" at nighttime and wants to know what she needs to do.  Patient states that she was in the area and just dropped in. She wants to know if her medication needs to be adjusted.

## 2023-12-12 DIAGNOSIS — H40023 Open angle with borderline findings, high risk, bilateral: Secondary | ICD-10-CM | POA: Diagnosis not present

## 2023-12-22 DIAGNOSIS — J449 Chronic obstructive pulmonary disease, unspecified: Secondary | ICD-10-CM | POA: Diagnosis not present

## 2023-12-22 DIAGNOSIS — G894 Chronic pain syndrome: Secondary | ICD-10-CM | POA: Diagnosis not present

## 2023-12-22 DIAGNOSIS — I10 Essential (primary) hypertension: Secondary | ICD-10-CM | POA: Diagnosis not present

## 2023-12-22 DIAGNOSIS — E1140 Type 2 diabetes mellitus with diabetic neuropathy, unspecified: Secondary | ICD-10-CM | POA: Diagnosis not present

## 2024-01-23 DIAGNOSIS — E1140 Type 2 diabetes mellitus with diabetic neuropathy, unspecified: Secondary | ICD-10-CM | POA: Diagnosis not present

## 2024-01-23 DIAGNOSIS — I10 Essential (primary) hypertension: Secondary | ICD-10-CM | POA: Diagnosis not present

## 2024-01-23 DIAGNOSIS — C3490 Malignant neoplasm of unspecified part of unspecified bronchus or lung: Secondary | ICD-10-CM | POA: Diagnosis not present

## 2024-01-23 DIAGNOSIS — F419 Anxiety disorder, unspecified: Secondary | ICD-10-CM | POA: Diagnosis not present

## 2024-01-23 DIAGNOSIS — G894 Chronic pain syndrome: Secondary | ICD-10-CM | POA: Diagnosis not present

## 2024-01-23 DIAGNOSIS — J449 Chronic obstructive pulmonary disease, unspecified: Secondary | ICD-10-CM | POA: Diagnosis not present

## 2024-01-30 ENCOUNTER — Telehealth: Payer: Self-pay

## 2024-01-30 NOTE — Telephone Encounter (Signed)
Detailed message left on identifiable machine belonging to the patient. Advised to call with any questions or concerns.

## 2024-01-30 NOTE — Telephone Encounter (Signed)
 Patient left message Marissa [name] that she was having drops in her sugar. I called patient today to ask mor information. She states that for a week now she has been having to get up 1-2 times a day to drink glucose drinks because her sugar on her meter runs low. She states that she has stopped taking her tresiba  at night and has been taking it in the morning and things are going better. She wanted to make sure that was ok.

## 2024-01-31 NOTE — Telephone Encounter (Signed)
 Patient notified and states she has been taking 12 units when she started on the 5mg  of Mounjaro 

## 2024-02-22 DIAGNOSIS — E1165 Type 2 diabetes mellitus with hyperglycemia: Secondary | ICD-10-CM | POA: Diagnosis not present

## 2024-03-02 ENCOUNTER — Ambulatory Visit: Payer: Medicare HMO | Admitting: Internal Medicine

## 2024-03-02 ENCOUNTER — Encounter: Payer: Self-pay | Admitting: Internal Medicine

## 2024-03-02 VITALS — BP 122/80 | HR 106 | Ht 65.0 in | Wt 147.0 lb

## 2024-03-02 DIAGNOSIS — E1142 Type 2 diabetes mellitus with diabetic polyneuropathy: Secondary | ICD-10-CM

## 2024-03-02 DIAGNOSIS — E1165 Type 2 diabetes mellitus with hyperglycemia: Secondary | ICD-10-CM

## 2024-03-02 DIAGNOSIS — Z794 Long term (current) use of insulin: Secondary | ICD-10-CM

## 2024-03-02 DIAGNOSIS — E1159 Type 2 diabetes mellitus with other circulatory complications: Secondary | ICD-10-CM | POA: Diagnosis not present

## 2024-03-02 DIAGNOSIS — E0500 Thyrotoxicosis with diffuse goiter without thyrotoxic crisis or storm: Secondary | ICD-10-CM

## 2024-03-02 DIAGNOSIS — E039 Hypothyroidism, unspecified: Secondary | ICD-10-CM

## 2024-03-02 LAB — POCT GLYCOSYLATED HEMOGLOBIN (HGB A1C): Hemoglobin A1C: 6.7 % — AB (ref 4.0–5.6)

## 2024-03-02 NOTE — Patient Instructions (Addendum)
-   Continue  Mounjaro  5 mg weekly  - Continue Metformin  ONE tablet with Breakfast and ONE tablet with Supper - Continue Jardiance  25 mg with breakfast   - Decrease Tresiba  6 units once daily   -HOW TO TREAT LOW BLOOD SUGARS (Blood sugar LESS THAN 70 MG/DL) Please follow the RULE OF 15 for the treatment of hypoglycemia treatment (when your (blood sugars are less than 70 mg/dL)   STEP 1: Take 15 grams of carbohydrates when your blood sugar is low, which includes:  3-4 GLUCOSE TABS  OR 3-4 OZ OF JUICE OR REGULAR SODA OR ONE TUBE OF GLUCOSE GEL    STEP 2: RECHECK blood sugar in 15 MINUTES STEP 3: If your blood sugar is still low at the 15 minute recheck --> then, go back to STEP 1 and treat AGAIN with another 15 grams of carbohydrates.

## 2024-03-02 NOTE — Progress Notes (Signed)
 Name: Marissa [name]  Age/ Sex: 58 y.o., female   MRN/ DOB: [id number], [birth year]     PCP: Kathyleen Parkins, MD   Reason for Endocrinology Evaluation: Type 2 Diabetes Mellitus  Initial Endocrine Consultative Visit: 06/04/2019    PATIENT IDENTIFIER: Marissa [name] is a 58 y.o. female with a past medical history of HTN,T2DM, Dyslipidemia and Lung Ca (2006) . The patient has followed with Endocrinology clinic since 06/03/2001 for consultative assistance with management of her diabetes.  DIABETIC HISTORY:  [name] was diagnosed with T2DM in 2006. She has been on oral glycemic agents in the past ( Glipizide, glimepiride , metformin , Farxiga ( not covered) , insulin  added in 01/2019. Her hemoglobin A1c has ranged from 6.7% in 2015, peaking at 13.8% in 02/2019.   On her initial visit to our clinic her A1c was 10.8%  , she was on Glimepiride , Metformin  and Levemir . We stopped Levemir , and Glimepiride  , started insulin  Mix and reduced metformin  dose due to nausea and diarrhea.  Jardiance  started 10/2019 Stopped insulin  mix and started basal insulin  06/2021 Started trulicity  06/2021 but this was stopped due to nausea and vomiting  Repaglinide  started 11/2022 with an A1c of 9.0%   Start Mounjaro  06/2023 with an A1c of 8.9%     HYPOKALEMIA: She has had intermittent spontaneous hypokalemia since 2020.  Her  aldosterone came back normal at 4.3 NG/DL with elevated running at 13.8 02/2021 She had an abnormal 24-hour urinary cortisol 07/15/2021 but this was attributed to improper collection.  Dexamethasone  suppression test was normal at 1.5 ug/dL 40/9811   SUBJECTIVE:   During the last visit (11/01/2023): A1c 7.3%   Today (03/02/24): Marissa [name] is here for a follow up on diabetes management. She checks her blood sugars  multiple times daily, through CGM. The patient has had hypoglycemic episodes since the last clinic visit.  Patient is symptomatic with these episodes   She is S/P right upper  lobe wedge resection for adenocarcinoma 02/2021 for Stage Ia adenocarcinoma with an 8 mm invasive component, negative L.N  She is accompanied by her sister-in-law today She has been using repaglinide  sporadically for hypoglycemia only Denies nausea or vomiting  Denies constipation with diarrhea    HOME DIABETES REGIMEN:  Metformin  500 mg ,1 tablet daily  Jardiance  25 mg daily  Tresiba  8 units daily Mounjaro  5 mg weekly    CONTINUOUS GLUCOSE MONITORING RECORD INTERPRETATION    Dates of Recording: 5/10-5/23/2025  Sensor description: Dexcom  Results statistics:   CGM use % of time 92  Average and SD 116/22  Time in range 99%  % Time Above 180 1  % Time above 250 0  % Time Below target 0     Glycemic patterns summary: BGs are optimal overnight and   Hypoglycemic episodes occurred N/A  Overnight periods: trends down to optimal      DIABETIC COMPLICATIONS: Microvascular complications:  Neuropathy , glaucoma  Denies: CKD , retinopathy  Last eye exam: Completed 06/10/2023   Macrovascular complications:  CAD ( S/P PCI )  Denies:  PVD, CVA   HISTORY:  Past Medical History:  Past Medical History:  Diagnosis Date   Adenocarcinoma of lung (HCC) 02/06/2014   Anxiety    Arthritis    COPD (chronic obstructive pulmonary disease) (HCC)    Coronary artery disease    Diabetes mellitus    GERD (gastroesophageal reflux disease)    Hypertension    Hypothyroidism    Lung cancer (HCC)  Past Surgical History:  Past Surgical History:  Procedure Laterality Date   APPENDECTOMY     CHOLECYSTECTOMY     INTERCOSTAL NERVE BLOCK Right 02/19/2021   Procedure: INTERCOSTAL NERVE BLOCK;  Surgeon: Zelphia Higashi, MD;  Location: [facility] OR;  Service: Thoracic;  Laterality: Right;   LOBECTOMY Left 2006   NM MYOCAR IMG MI  05/2008   lexiscan -perfusion defect in anterior myocardium (breast attenuation); remaining myocardium with NO evidence of ischemia or infarct; EF 78%; low risk  scan   NODE DISSECTION Right 02/19/2021   Procedure: NODE DISSECTION;  Surgeon: Zelphia Higashi, MD;  Location: [facility] OR;  Service: Thoracic;  Laterality: Right;   OOPHORECTOMY Left    PARTIAL HYSTERECTOMY  1985   SALPINGOOPHORECTOMY Left    benign 9lb mass removed and a massive wall of smaller tumors removed   THORACIC AORTIC ENDOVASCULAR STENT GRAFT N/A 06/04/2019   Procedure: THORACIC AORTIC ENDOVASCULAR STENT GRAFT;  Surgeon: Dannis Dy, MD;  Location: [facility] OR;  Service: Vascular;  Laterality: N/A;   TRANSTHORACIC ECHOCARDIOGRAM  05/2008   EF =/>55%, mild MR; trace TR   VIDEO BRONCHOSCOPY WITH ENDOBRONCHIAL NAVIGATION N/A 02/19/2021   Procedure: VIDEO BRONCHOSCOPY WITH ENDOBRONCHIAL NAVIGATION;  Surgeon: Zelphia Higashi, MD;  Location: [facility] OR;  Service: Thoracic;  Laterality: N/A;   Social History:  reports that she has been smoking cigarettes. She started smoking about 35 years ago. She has a 16 pack-year smoking history. She has never used smokeless tobacco. She reports that she does not drink alcohol and does not use drugs. Family History:  Family History  Problem Relation Age of Onset   Colon cancer Mother    Lung cancer Mother    Heart disease Mother    Heart disease Father    Ovarian cancer Sister    Hepatitis Brother    Hernia Brother      HOME MEDICATIONS: Allergies as of 03/02/2024       Reactions   Bee Venom Anaphylaxis   Penicillins Swelling, Rash, Other (See Comments)   Did it involve swelling of the face/tongue/throat, SOB, or low BP? Yes-whole body (swelling) Did it involve sudden or severe rash/hives, skin peeling, or any reaction on the inside of your mouth or nose? Yes-Hives Did you need to seek medical attention at a hospital or doctor's office? Yes When did it last happen?  Childhood     If all above answers are "NO", may proceed with cephalosporin use.   Poison Oak Extract Anaphylaxis   Trulicity  [dulaglutide ] Nausea And Vomiting         Medication List        Accurate as of Mar 02, 2024  2:22 PM. If you have any questions, ask your nurse or doctor.          STOP taking these medications    repaglinide  0.5 MG tablet Commonly known as: PRANDIN  Stopped by: Alrick Cubbage J Stirling Orton       TAKE these medications    acetaminophen  500 MG tablet Commonly known as: TYLENOL  Take 2 tablets (1,000 mg total) by mouth every 6 (six) hours.   albuterol  (2.5 MG/3ML) 0.083% nebulizer solution Commonly known as: PROVENTIL  Take 2.5 mg by nebulization every 6 (six) hours as needed for shortness of breath or wheezing.   albuterol -ipratropium 18-103 MCG/ACT inhaler Commonly known as: COMBIVENT Inhale 2 puffs into the lungs every 6 (six) hours as needed for wheezing or shortness of breath.   ALPRAZolam  0.5 MG tablet Commonly known as:  XANAX  Take 0.5 mg by mouth at bedtime.   aspirin  EC 81 MG tablet Take 81 mg by mouth daily.   atorvastatin  80 MG tablet Commonly known as: LIPITOR  Take 1 tablet (80 mg total) by mouth daily at 6 PM.   BD Pen Needle Nano 2nd Gen 32G X 4 MM Misc Generic drug: Insulin  Pen Needle 1 Device by Other route daily in the afternoon.   Dexcom G7 Sensor Misc 1 Device by Does not apply route as directed.   Eliquis  5 MG Tabs tablet Generic drug: apixaban  TAKE 1 TABLET(5 MG) BY MOUTH TWICE DAILY   empagliflozin  25 MG Tabs tablet Commonly known as: Jardiance  Take 1 tablet (25 mg total) by mouth daily before breakfast.   esomeprazole  40 MG capsule Commonly known as: NEXIUM  Take 1 capsule (40 mg total) by mouth daily before breakfast.   ezetimibe  10 MG tablet Commonly known as: ZETIA  Take 10 mg by mouth daily.   latanoprost  0.005 % ophthalmic solution Commonly known as: XALATAN  Place 1 drop into both eyes at bedtime.   levothyroxine  50 MCG tablet Commonly known as: SYNTHROID  Take 1 tablet (50 mcg total) by mouth daily.   lisinopril  10 MG tablet Commonly known as: ZESTRIL  Take 10 mg by  mouth daily.   metFORMIN  500 MG 24 hr tablet Commonly known as: GLUCOPHAGE -XR Take 1 tablet (500 mg total) by mouth daily with breakfast.   OneTouch Ultra test strip Generic drug: glucose blood 1 each by Other route in the morning, at noon, in the evening, and at bedtime. Check blood sugar 3 times daily   oxyCODONE  15 MG immediate release tablet Commonly known as: ROXICODONE  Take 15 mg by mouth every 6 (six) hours as needed for pain.   tirzepatide  5 MG/0.5ML Pen Commonly known as: MOUNJARO  Inject 5 mg into the skin once a week.   Tresiba  FlexTouch 100 UNIT/ML FlexTouch Pen Generic drug: insulin  degludec Inject 20 Units into the skin daily. What changed: how much to take   Vitamin D  (Ergocalciferol ) 1.25 MG (50000 UNIT) Caps capsule Commonly known as: DRISDOL  Take 50,000 Units by mouth once a week.   zolpidem  10 MG tablet Commonly known as: AMBIEN  Take 10 mg by mouth at bedtime.         OBJECTIVE:   Vital Signs: BP 122/80 (BP Location: Right Arm, Patient Position: Sitting, Cuff Size: Normal)   Pulse (!) 106   Ht 5\' 5"  (1.651 m)   Wt 147 lb (66.7 kg)   SpO2 98%   BMI 24.46 kg/m   Wt Readings from Last 3 Encounters:  03/02/24 147 lb (66.7 kg)  11/01/23 162 lb 6.4 oz (73.7 kg)  06/15/23 162 lb (73.5 kg)     Exam: General: Pt appears well and is in NAD  Lungs: Clear with good BS bilat   Heart: RRR with normal  Extremities: No pretibial edema.   Neuro: MS is good with appropriate affect, pt is alert and Ox3       DM foot exam: 06/15/2023   The skin of the feet is intact without sores or ulcerations. The pedal pulses are 2+ on right and 2+ on left. The sensation is intact to a screening 5.07, 10 gram monofilament bilaterally    DATA REVIEWED:  Lab Results  Component Value Date   HGBA1C 6.7 (A) 03/02/2024   HGBA1C 7.3 (A) 11/01/2023   HGBA1C 8.9 (A) 06/15/2023     Latest Reference Range & Units 06/15/23 12:17  Sodium 135 - 145 mEq/L  139  Potassium  3.5 - 5.1 mEq/L 4.5  Chloride 96 - 112 mEq/L 103  CO2 19 - 32 mEq/L 28  Glucose 70 - 99 mg/dL 454 (H)  BUN 6 - 23 mg/dL 13  Creatinine 0.98 - 1.19 mg/dL 1.47  Calcium  8.4 - 10.5 mg/dL 9.7  GFR >82.95 mL/min 97.48    Latest Reference Range & Units 06/15/23 12:17  Creatinine,U mg/dL 62.1  Microalb, Ur 0.0 - 1.9 mg/dL <3.0  MICROALB/CREAT RATIO 0.0 - 30.0 mg/g 3.0      Latest Reference Range & Units 06/15/23 12:17  TRAB <=2.00 IU/L 3.42 (H)  (H): Data is abnormally high  ASSESSMENT / PLAN / RECOMMENDATIONS:   1) Type 2 Diabetes Mellitus, Optimally  controlled , With Neuropathic and Macrovascular  complications - Most recent A1c of 6.7%. Goal A1c < 7.0 %.    -A1c is optimal at 6.7% -I have recommended discontinuing the insulin  but she is concerned that if she does not take insulin  she will lose the Dexcom, patient will discontinue repaglinide  -Intolerant to higher doses of metformin  - Intolerant to Trulicity   - No other changes  MEDICATIONS: Continue Mounjaro  5 mg weekly  Continue Metformin  500 mg XR, 1 tab BID Continue Jardiance  25 mg daily  Decrease Tresiba  6 units daily    EDUCATION / INSTRUCTIONS: BG monitoring instructions: Patient is instructed to check her blood sugars 2 times a day, breakfast and supper. Call [facility] if: BG persistently < 70  I reviewed the Rule of 15 for the treatment of hypoglycemia in detail with the patient. Literature supplied.    2) Hypothyroidism:  -Patient is on long-term levothyroxine , her most recent TSH was normal as of May 2024 -She is having eye issues and was evaluated by 3 different eye specialist, her last exam was at Dr. Linnell Richardson office and someone suggested a checkup for thyroid  eye disease   3) Graves' disease:    -This was diagnosed based on elevated  TRAb 06/2023 -Interestingly enough she has been on LT-4 replacement therapy for many years, this was checked based on ophthalmology recommendations as  the patient was having eye symptoms  F/U in 6 months    Signed electronically by: Natale Bail, MD  [facility]  [facility] [address] [place] [place], [place] [address] Phone: [phone] FAX: [phone]   CC: Kathyleen Parkins, MD [address] [place] [address] Phone: [phone]  Fax: [phone]  Return to Endocrinology clinic as below: No future appointments.

## 2024-03-06 ENCOUNTER — Encounter: Payer: Self-pay | Admitting: Internal Medicine

## 2024-03-23 DIAGNOSIS — G894 Chronic pain syndrome: Secondary | ICD-10-CM | POA: Diagnosis not present

## 2024-03-23 DIAGNOSIS — E1140 Type 2 diabetes mellitus with diabetic neuropathy, unspecified: Secondary | ICD-10-CM | POA: Diagnosis not present

## 2024-03-23 DIAGNOSIS — J449 Chronic obstructive pulmonary disease, unspecified: Secondary | ICD-10-CM | POA: Diagnosis not present

## 2024-03-23 DIAGNOSIS — I10 Essential (primary) hypertension: Secondary | ICD-10-CM | POA: Diagnosis not present

## 2024-03-23 DIAGNOSIS — Z6823 Body mass index (BMI) 23.0-23.9, adult: Secondary | ICD-10-CM | POA: Diagnosis not present

## 2024-03-23 DIAGNOSIS — F419 Anxiety disorder, unspecified: Secondary | ICD-10-CM | POA: Diagnosis not present

## 2024-03-23 DIAGNOSIS — E1165 Type 2 diabetes mellitus with hyperglycemia: Secondary | ICD-10-CM | POA: Diagnosis not present

## 2024-05-18 DIAGNOSIS — E1140 Type 2 diabetes mellitus with diabetic neuropathy, unspecified: Secondary | ICD-10-CM | POA: Diagnosis not present

## 2024-05-18 DIAGNOSIS — I10 Essential (primary) hypertension: Secondary | ICD-10-CM | POA: Diagnosis not present

## 2024-05-18 DIAGNOSIS — J449 Chronic obstructive pulmonary disease, unspecified: Secondary | ICD-10-CM | POA: Diagnosis not present

## 2024-05-18 DIAGNOSIS — G894 Chronic pain syndrome: Secondary | ICD-10-CM | POA: Diagnosis not present

## 2024-05-22 DIAGNOSIS — E1165 Type 2 diabetes mellitus with hyperglycemia: Secondary | ICD-10-CM | POA: Diagnosis not present

## 2024-06-14 ENCOUNTER — Other Ambulatory Visit: Payer: Self-pay | Admitting: Internal Medicine

## 2024-06-14 DIAGNOSIS — H40013 Open angle with borderline findings, low risk, bilateral: Secondary | ICD-10-CM | POA: Diagnosis not present

## 2024-06-14 DIAGNOSIS — E119 Type 2 diabetes mellitus without complications: Secondary | ICD-10-CM | POA: Diagnosis not present

## 2024-07-13 DIAGNOSIS — F419 Anxiety disorder, unspecified: Secondary | ICD-10-CM | POA: Diagnosis not present

## 2024-07-13 DIAGNOSIS — I10 Essential (primary) hypertension: Secondary | ICD-10-CM | POA: Insufficient documentation

## 2024-07-13 DIAGNOSIS — Z720 Tobacco use: Secondary | ICD-10-CM | POA: Diagnosis not present

## 2024-07-13 DIAGNOSIS — G4700 Insomnia, unspecified: Secondary | ICD-10-CM | POA: Insufficient documentation

## 2024-07-13 DIAGNOSIS — E782 Mixed hyperlipidemia: Secondary | ICD-10-CM | POA: Diagnosis not present

## 2024-07-13 DIAGNOSIS — J449 Chronic obstructive pulmonary disease, unspecified: Secondary | ICD-10-CM | POA: Diagnosis not present

## 2024-07-13 DIAGNOSIS — E1121 Type 2 diabetes mellitus with diabetic nephropathy: Secondary | ICD-10-CM | POA: Diagnosis not present

## 2024-07-13 DIAGNOSIS — Z7689 Persons encountering health services in other specified circumstances: Secondary | ICD-10-CM | POA: Diagnosis not present

## 2024-07-13 DIAGNOSIS — E039 Hypothyroidism, unspecified: Secondary | ICD-10-CM | POA: Diagnosis not present

## 2024-07-13 DIAGNOSIS — E1140 Type 2 diabetes mellitus with diabetic neuropathy, unspecified: Secondary | ICD-10-CM | POA: Insufficient documentation

## 2024-07-13 DIAGNOSIS — F5105 Insomnia due to other mental disorder: Secondary | ICD-10-CM | POA: Diagnosis not present

## 2024-07-20 DIAGNOSIS — R42 Dizziness and giddiness: Secondary | ICD-10-CM | POA: Diagnosis not present

## 2024-07-20 DIAGNOSIS — E1121 Type 2 diabetes mellitus with diabetic nephropathy: Secondary | ICD-10-CM | POA: Diagnosis not present

## 2024-07-20 DIAGNOSIS — E039 Hypothyroidism, unspecified: Secondary | ICD-10-CM | POA: Diagnosis not present

## 2024-07-27 DIAGNOSIS — J449 Chronic obstructive pulmonary disease, unspecified: Secondary | ICD-10-CM | POA: Diagnosis not present

## 2024-07-27 DIAGNOSIS — Z720 Tobacco use: Secondary | ICD-10-CM | POA: Diagnosis not present

## 2024-07-27 DIAGNOSIS — E039 Hypothyroidism, unspecified: Secondary | ICD-10-CM | POA: Diagnosis not present

## 2024-07-27 DIAGNOSIS — E782 Mixed hyperlipidemia: Secondary | ICD-10-CM | POA: Diagnosis not present

## 2024-07-27 DIAGNOSIS — I10 Essential (primary) hypertension: Secondary | ICD-10-CM | POA: Diagnosis not present

## 2024-07-27 DIAGNOSIS — F419 Anxiety disorder, unspecified: Secondary | ICD-10-CM | POA: Diagnosis not present

## 2024-07-27 DIAGNOSIS — E1121 Type 2 diabetes mellitus with diabetic nephropathy: Secondary | ICD-10-CM | POA: Diagnosis not present

## 2024-08-17 DIAGNOSIS — G894 Chronic pain syndrome: Secondary | ICD-10-CM | POA: Insufficient documentation

## 2024-08-20 DIAGNOSIS — E1165 Type 2 diabetes mellitus with hyperglycemia: Secondary | ICD-10-CM | POA: Diagnosis not present

## 2024-09-03 ENCOUNTER — Encounter: Payer: Self-pay | Admitting: Internal Medicine

## 2024-09-03 ENCOUNTER — Ambulatory Visit: Admitting: Internal Medicine

## 2024-09-03 VITALS — BP 100/50 | Ht 65.0 in | Wt 134.0 lb

## 2024-09-03 DIAGNOSIS — Z794 Long term (current) use of insulin: Secondary | ICD-10-CM | POA: Diagnosis not present

## 2024-09-03 DIAGNOSIS — E039 Hypothyroidism, unspecified: Secondary | ICD-10-CM | POA: Diagnosis not present

## 2024-09-03 DIAGNOSIS — E1159 Type 2 diabetes mellitus with other circulatory complications: Secondary | ICD-10-CM

## 2024-09-03 DIAGNOSIS — E1142 Type 2 diabetes mellitus with diabetic polyneuropathy: Secondary | ICD-10-CM

## 2024-09-03 DIAGNOSIS — E0500 Thyrotoxicosis with diffuse goiter without thyrotoxic crisis or storm: Secondary | ICD-10-CM

## 2024-09-03 LAB — POCT GLYCOSYLATED HEMOGLOBIN (HGB A1C): Hemoglobin A1C: 6.4 % — AB (ref 4.0–5.6)

## 2024-09-03 NOTE — Patient Instructions (Signed)
-   STOP Tresiba   - Continue  Mounjaro  5 mg weekly  - Continue Metformin  ONE tablet with Breakfast and ONE tablet with Supper - Continue Jardiance  25 mg with breakfast     -HOW TO TREAT LOW BLOOD SUGARS (Blood sugar LESS THAN 70 MG/DL) Please follow the RULE OF 15 for the treatment of hypoglycemia treatment (when your (blood sugars are less than 70 mg/dL)   STEP 1: Take 15 grams of carbohydrates when your blood sugar is low, which includes:  3-4 GLUCOSE TABS  OR 3-4 OZ OF JUICE OR REGULAR SODA OR ONE TUBE OF GLUCOSE GEL    STEP 2: RECHECK blood sugar in 15 MINUTES STEP 3: If your blood sugar is still low at the 15 minute recheck --> then, go back to STEP 1 and treat AGAIN with another 15 grams of carbohydrates.

## 2024-09-03 NOTE — Progress Notes (Signed)
 Name: Marissa [name]  Age/ Sex: 58 y.o., female   MRN/ DOB: [id number], [birth year]     PCP: Bertell Satterfield, MD   Reason for Endocrinology Evaluation: Type 2 Diabetes Mellitus  Initial Endocrine Consultative Visit: 06/04/2019    PATIENT IDENTIFIER: Marissa [name] is a 58 y.o. female with a past medical history of HTN,T2DM, Dyslipidemia and Lung Ca (2006) . The patient has followed with Endocrinology clinic since 06/03/2001 for consultative assistance with management of her diabetes.  DIABETIC HISTORY:  Marissa [name] was diagnosed with T2DM in 2006. She has been on oral glycemic agents in the past ( Glipizide, glimepiride , metformin , Farxiga ( not covered) , insulin  added in 01/2019. Her hemoglobin A1c has ranged from 6.7% in 2015, peaking at 13.8% in 02/2019.   On her initial visit to our clinic her A1c was 10.8%  , she was on Glimepiride , Metformin  and Levemir . We stopped Levemir , and Glimepiride  , started insulin  Mix and reduced metformin  dose due to nausea and diarrhea.  Jardiance  started 10/2019 Stopped insulin  mix and started basal insulin  06/2021 Started trulicity  06/2021 but this was stopped due to nausea and vomiting  Repaglinide  started 11/2022 with an A1c of 9.0%   Start Mounjaro  06/2023 with an A1c of 8.9%     HYPOKALEMIA: She has had intermittent spontaneous hypokalemia since 2020.  Her  aldosterone came back normal at 4.3 NG/DL with elevated running at 13.8 02/2021 She had an abnormal 24-hour urinary cortisol 07/15/2021 but this was attributed to improper collection.  Dexamethasone  suppression test was normal at 1.5 ug/dL 89/7977   SUBJECTIVE:   During the last visit (03/02/2024): A1c 6.7%   Today (09/03/24): Marissa [name] is here for a follow up on diabetes management. She checks her blood sugars  multiple times daily, through CGM. The patient has had hypoglycemic episodes since the last clinic visit.  Patient is symptomatic with these episodes   She is S/P right upper  lobe wedge resection for adenocarcinoma 02/2021 for Stage Ia adenocarcinoma with an 8 mm invasive component, negative L.N  She is accompanied by her sister-in-law today  She has been using repaglinide  sporadically for hypoglycemia only Denies nausea or vomiting  Denies constipation with diarrhea    HOME DIABETES REGIMEN:  Metformin  500 mg ,1 tablet daily  Jardiance  25 mg daily  Tresiba  5 units daily Mounjaro  5 mg weekly    CONTINUOUS GLUCOSE MONITORING RECORD INTERPRETATION    Dates of Recording: 11/11-11/24/2025  Sensor description: Dexcom  Results statistics:   CGM use % of time 93  Average and SD 113/26  Time in range 98%  % Time Above 180 2  % Time above 250 0  % Time Below target 0     Glycemic patterns summary:  Hypoglycemic episodes occurred N/A  Overnight periods: trends down to optimal      DIABETIC COMPLICATIONS: Microvascular complications:  Neuropathy , glaucoma  Denies: CKD , retinopathy  Last eye exam: Completed 06/14/2024   Macrovascular complications:  CAD ( S/P PCI )  Denies:  PVD, CVA   HISTORY:  Past Medical History:  Past Medical History:  Diagnosis Date   Adenocarcinoma of lung (HCC) 02/06/2014   Anxiety    Arthritis    COPD (chronic obstructive pulmonary disease) (HCC)    Coronary artery disease    Diabetes mellitus    GERD (gastroesophageal reflux disease)    Hypertension    Hypothyroidism    Lung cancer (HCC)    Past Surgical History:  Past  Surgical History:  Procedure Laterality Date   APPENDECTOMY     CHOLECYSTECTOMY     INTERCOSTAL NERVE BLOCK Right 02/19/2021   Procedure: INTERCOSTAL NERVE BLOCK;  Surgeon: Kerrin Elspeth BROCKS, MD;  Location: [facility] OR;  Service: Thoracic;  Laterality: Right;   LOBECTOMY Left 2006   NM MYOCAR IMG MI  05/2008   lexiscan -perfusion defect in anterior myocardium (breast attenuation); remaining myocardium with NO evidence of ischemia or infarct; EF 78%; low risk scan   NODE DISSECTION Right  02/19/2021   Procedure: NODE DISSECTION;  Surgeon: Kerrin Elspeth BROCKS, MD;  Location: [facility] OR;  Service: Thoracic;  Laterality: Right;   OOPHORECTOMY Left    PARTIAL HYSTERECTOMY  1985   SALPINGOOPHORECTOMY Left    benign 9lb mass removed and a massive wall of smaller tumors removed   THORACIC AORTIC ENDOVASCULAR STENT GRAFT N/A 06/04/2019   Procedure: THORACIC AORTIC ENDOVASCULAR STENT GRAFT;  Surgeon: Eliza Lonni RAMAN, MD;  Location: [facility] OR;  Service: Vascular;  Laterality: N/A;   TRANSTHORACIC ECHOCARDIOGRAM  05/2008   EF =/>55%, mild MR; trace TR   VIDEO BRONCHOSCOPY WITH ENDOBRONCHIAL NAVIGATION N/A 02/19/2021   Procedure: VIDEO BRONCHOSCOPY WITH ENDOBRONCHIAL NAVIGATION;  Surgeon: Kerrin Elspeth BROCKS, MD;  Location: [facility] OR;  Service: Thoracic;  Laterality: N/A;   Social History:  reports that she has been smoking cigarettes. She started smoking about 35 years ago. She has a 16 pack-year smoking history. She has never used smokeless tobacco. She reports that she does not drink alcohol and does not use drugs. Family History:  Family History  Problem Relation Age of Onset   Colon cancer Mother    Lung cancer Mother    Heart disease Mother    Heart disease Father    Ovarian cancer Sister    Hepatitis Brother    Hernia Brother      HOME MEDICATIONS: Allergies as of 09/03/2024       Reactions   Bee Venom Anaphylaxis   Penicillins Swelling, Rash, Other (See Comments)   Did it involve swelling of the face/tongue/throat, SOB, or low BP? Yes-whole body (swelling) Did it involve sudden or severe rash/hives, skin peeling, or any reaction on the inside of your mouth or nose? Yes-Hives Did you need to seek medical attention at a hospital or doctor's office? Yes When did it last happen?  Childhood     If all above answers are NO, may proceed with cephalosporin use.   Poison Oak Extract Anaphylaxis   Trulicity  [dulaglutide ] Nausea And Vomiting        Medication List         Accurate as of September 03, 2024  1:10 PM. If you have any questions, ask your nurse or doctor.          acetaminophen  500 MG tablet Commonly known as: TYLENOL  Take 2 tablets (1,000 mg total) by mouth every 6 (six) hours.   albuterol  (2.5 MG/3ML) 0.083% nebulizer solution Commonly known as: PROVENTIL  Take 2.5 mg by nebulization every 6 (six) hours as needed for shortness of breath or wheezing.   albuterol -ipratropium 18-103 MCG/ACT inhaler Commonly known as: COMBIVENT Inhale 2 puffs into the lungs every 6 (six) hours as needed for wheezing or shortness of breath.   ALPRAZolam  0.5 MG tablet Commonly known as: XANAX  Take 0.5 mg by mouth at bedtime.   aspirin  EC 81 MG tablet Take 81 mg by mouth daily.   atorvastatin  80 MG tablet Commonly known as: LIPITOR  Take 1 tablet (80 mg total)  by mouth daily at 6 PM.   BD Pen Needle Nano 2nd Gen 32G X 4 MM Misc Generic drug: Insulin  Pen Needle 1 Device by Other route daily in the afternoon.   Dexcom G7 Sensor Misc 1 Device by Does not apply route as directed.   Eliquis  5 MG Tabs tablet Generic drug: apixaban  TAKE 1 TABLET(5 MG) BY MOUTH TWICE DAILY   empagliflozin  25 MG Tabs tablet Commonly known as: Jardiance  Take 1 tablet (25 mg total) by mouth daily before breakfast.   esomeprazole  40 MG capsule Commonly known as: NEXIUM  Take 1 capsule (40 mg total) by mouth daily before breakfast.   ezetimibe  10 MG tablet Commonly known as: ZETIA  Take 10 mg by mouth daily.   latanoprost  0.005 % ophthalmic solution Commonly known as: XALATAN  Place 1 drop into both eyes at bedtime.   levothyroxine  50 MCG tablet Commonly known as: SYNTHROID  Take 1 tablet (50 mcg total) by mouth daily.   lisinopril  10 MG tablet Commonly known as: ZESTRIL  Take 10 mg by mouth daily.   metFORMIN  500 MG 24 hr tablet Commonly known as: GLUCOPHAGE -XR Take 1 tablet (500 mg total) by mouth daily with breakfast.   OneTouch Ultra test strip Generic drug:  glucose blood 1 each by Other route in the morning, at noon, in the evening, and at bedtime. Check blood sugar 3 times daily   oxyCODONE  15 MG immediate release tablet Commonly known as: ROXICODONE  Take 15 mg by mouth every 6 (six) hours as needed for pain.   tirzepatide  5 MG/0.5ML Pen Commonly known as: MOUNJARO  Inject 5 mg into the skin once a week.   Tresiba  FlexTouch 100 UNIT/ML FlexTouch Pen Generic drug: insulin  degludec Inject 20 Units into the skin daily. What changed: how much to take   Vitamin D  (Ergocalciferol ) 1.25 MG (50000 UNIT) Caps capsule Commonly known as: DRISDOL  Take 50,000 Units by mouth once a week.   zolpidem  10 MG tablet Commonly known as: AMBIEN  Take 10 mg by mouth at bedtime.         OBJECTIVE:   Vital Signs: BP (!) 100/50   Ht 5' 5 (1.651 m)   Wt 134 lb (60.8 kg)   BMI 22.30 kg/m   Wt Readings from Last 3 Encounters:  09/03/24 134 lb (60.8 kg)  03/02/24 147 lb (66.7 kg)  11/01/23 162 lb 6.4 oz (73.7 kg)     Exam: General: Pt appears well and is in NAD  Lungs: Clear with good BS bilat   Heart: RRR with normal  Extremities: No pretibial edema.   Neuro: MS is good with appropriate affect, pt is alert and Ox3       DM foot exam: 911/24/2025   The skin of the feet is intact without sores or ulcerations. The pedal pulses are 2+ on right and 2+ on left. The sensation is intact to a screening 5.07, 10 gram monofilament bilaterally    DATA REVIEWED:  Lab Results  Component Value Date   HGBA1C 6.4 (A) 09/03/2024   HGBA1C 6.7 (A) 03/02/2024   HGBA1C 7.3 (A) 11/01/2023    07/21/2024 labs through PCPs office TSH 2.760  BUN 9 Cr. 0.76 GFR 90 Tg 94 HDL 31 LDL 68  Alb/cr < 5 J8r 6.6%     Latest Reference Range & Units 06/15/23 12:17  TRAB <=2.00 IU/L 3.42 (H)   Old records , labs and images have been reviewed.    ASSESSMENT / PLAN / RECOMMENDATIONS:   1) Type 2 Diabetes Mellitus,  Optimally  controlled , With  Neuropathic and Macrovascular  complications - Most recent A1c of 6.7%. Goal A1c < 7.0 %.    -A1c is optimal -Patient continues to with hypoglycemia and tight BG's despite being on Tresiba  5 units -I have again recommended discontinuing insulin  -We discontinued repaglinide  due to hypoglycemia-Intolerant to higher doses of metformin  - Intolerant to Trulicity   MEDICATIONS: Continue Mounjaro  5 mg weekly  Continue Metformin  500 mg XR, 1 tab BID Continue Jardiance  25 mg daily  Stop Tresiba  5 units daily    EDUCATION / INSTRUCTIONS: BG monitoring instructions: Patient is instructed to check her blood sugars 2 times a day, breakfast and supper. Call [facility] if: BG persistently < 70  I reviewed the Rule of 15 for the treatment of hypoglycemia in detail with the patient. Literature supplied.    2) Hypothyroidism:  -Patient is on long-term levothyroxine  -Her most recent TFTs through PCPs office were within normal range, patient to continue levothyroxine  50 mcg daily  3) Graves' disease:    -This was diagnosed based on elevated  TRAb 06/2023 -Interestingly enough she has been on LT-4 replacement therapy for many years, this was checked based on ophthalmology recommendations as the patient was having eye symptoms  F/U in 6 months    Signed electronically by: Stefano Redgie Butts, MD  [facility] End[facility]  [facility] [address] [place] [place], [place] [address] Phone: [phone] FAX: [phone]   CC: Bertell Satterfield, MD [address] [place] [address] Phone: [phone]  Fax: [phone]  Return to Endocrinology clinic as below: No future appointments.

## 2024-10-09 ENCOUNTER — Other Ambulatory Visit: Payer: Self-pay

## 2024-10-09 MED ORDER — EMPAGLIFLOZIN 25 MG PO TABS: 25.0000 mg | ORAL_TABLET | Freq: Every day | ORAL | 3 refills | Status: AC

## 2024-10-23 ENCOUNTER — Encounter: Payer: Self-pay | Admitting: *Deleted

## 2024-10-23 NOTE — Progress Notes (Signed)
 Marissa [name]                                          MRN: [id number]   10/23/2024   The [facility] Quality Team Specialist reviewed this patient medical record for the purposes of chart review for care gap closure. The following were reviewed: abstraction for care gap closure-controlling blood pressure.    [facility] Quality Team

## 2024-11-07 ENCOUNTER — Other Ambulatory Visit: Payer: Self-pay | Admitting: Internal Medicine

## 2025-03-05 ENCOUNTER — Ambulatory Visit: Admitting: Internal Medicine
